# Patient Record
Sex: Female | Born: 1960 | Hispanic: Yes | Marital: Single | Smoking: Never smoker
Health system: Southern US, Community
[De-identification: ages and names within clinical notes are randomized; demographics above are authoritative.]

## PROBLEM LIST (undated history)

## (undated) DIAGNOSIS — H3320 Serous retinal detachment, unspecified eye: Secondary | ICD-10-CM

## (undated) DIAGNOSIS — Z87448 Personal history of other diseases of urinary system: Secondary | ICD-10-CM

## (undated) DIAGNOSIS — E119 Type 2 diabetes mellitus without complications: Secondary | ICD-10-CM

## (undated) DIAGNOSIS — K219 Gastro-esophageal reflux disease without esophagitis: Secondary | ICD-10-CM

## (undated) DIAGNOSIS — R112 Nausea with vomiting, unspecified: Secondary | ICD-10-CM

## (undated) DIAGNOSIS — Z9889 Other specified postprocedural states: Secondary | ICD-10-CM

## (undated) HISTORY — DX: Personal history of other diseases of urinary system: Z87.448

## (undated) HISTORY — DX: Type 2 diabetes mellitus without complications: E11.9

## (undated) HISTORY — DX: Gastro-esophageal reflux disease without esophagitis: K21.9

## (undated) HISTORY — PX: EYE SURGERY: SHX253

## (undated) HISTORY — PX: TUBAL LIGATION: SHX77

---

## 2004-03-17 ENCOUNTER — Ambulatory Visit (HOSPITAL_COMMUNITY): Admission: RE | Admit: 2004-03-17 | Discharge: 2004-03-17 | Payer: Self-pay | Admitting: Family Medicine

## 2007-04-19 ENCOUNTER — Emergency Department (HOSPITAL_COMMUNITY): Admission: EM | Admit: 2007-04-19 | Discharge: 2007-04-19 | Payer: Self-pay | Admitting: Emergency Medicine

## 2008-03-08 ENCOUNTER — Inpatient Hospital Stay (HOSPITAL_COMMUNITY): Admission: EM | Admit: 2008-03-08 | Discharge: 2008-03-11 | Payer: Self-pay | Admitting: Emergency Medicine

## 2010-03-07 ENCOUNTER — Encounter: Payer: Self-pay | Admitting: Family Medicine

## 2010-05-31 LAB — DIFFERENTIAL
Basophils Absolute: 0 10*3/uL (ref 0.0–0.1)
Basophils Relative: 0 % (ref 0–1)
Basophils Relative: 0 % (ref 0–1)
Eosinophils Absolute: 0 10*3/uL (ref 0.0–0.7)
Eosinophils Absolute: 0 10*3/uL (ref 0.0–0.7)
Eosinophils Absolute: 0.1 10*3/uL (ref 0.0–0.7)
Eosinophils Relative: 0 % (ref 0–5)
Eosinophils Relative: 1 % (ref 0–5)
Eosinophils Relative: 1 % (ref 0–5)
Lymphocytes Relative: 17 % (ref 12–46)
Lymphocytes Relative: 23 % (ref 12–46)
Lymphs Abs: 0.4 10*3/uL — ABNORMAL LOW (ref 0.7–4.0)
Lymphs Abs: 0.8 10*3/uL (ref 0.7–4.0)
Lymphs Abs: 1 10*3/uL (ref 0.7–4.0)
Lymphs Abs: 1.2 10*3/uL (ref 0.7–4.0)
Monocytes Absolute: 0.5 10*3/uL (ref 0.1–1.0)
Monocytes Relative: 10 % (ref 3–12)
Monocytes Relative: 14 % — ABNORMAL HIGH (ref 3–12)
Neutro Abs: 3.2 10*3/uL (ref 1.7–7.7)
Neutro Abs: 3.6 10*3/uL (ref 1.7–7.7)
Neutrophils Relative %: 61 % (ref 43–77)
Neutrophils Relative %: 88 % — ABNORMAL HIGH (ref 43–77)

## 2010-05-31 LAB — GLUCOSE, CAPILLARY
Glucose-Capillary: 134 mg/dL — ABNORMAL HIGH (ref 70–99)
Glucose-Capillary: 140 mg/dL — ABNORMAL HIGH (ref 70–99)
Glucose-Capillary: 166 mg/dL — ABNORMAL HIGH (ref 70–99)
Glucose-Capillary: 189 mg/dL — ABNORMAL HIGH (ref 70–99)
Glucose-Capillary: 191 mg/dL — ABNORMAL HIGH (ref 70–99)
Glucose-Capillary: 193 mg/dL — ABNORMAL HIGH (ref 70–99)
Glucose-Capillary: 212 mg/dL — ABNORMAL HIGH (ref 70–99)
Glucose-Capillary: 222 mg/dL — ABNORMAL HIGH (ref 70–99)
Glucose-Capillary: 246 mg/dL — ABNORMAL HIGH (ref 70–99)
Glucose-Capillary: 249 mg/dL — ABNORMAL HIGH (ref 70–99)
Glucose-Capillary: 296 mg/dL — ABNORMAL HIGH (ref 70–99)
Glucose-Capillary: 364 mg/dL — ABNORMAL HIGH (ref 70–99)

## 2010-05-31 LAB — CBC
HCT: 29 % — ABNORMAL LOW (ref 36.0–46.0)
HCT: 30.9 % — ABNORMAL LOW (ref 36.0–46.0)
Hemoglobin: 10.7 g/dL — ABNORMAL LOW (ref 12.0–15.0)
Hemoglobin: 9.7 g/dL — ABNORMAL LOW (ref 12.0–15.0)
Hemoglobin: 9.7 g/dL — ABNORMAL LOW (ref 12.0–15.0)
MCHC: 33.5 g/dL (ref 30.0–36.0)
MCHC: 33.7 g/dL (ref 30.0–36.0)
MCHC: 33.8 g/dL (ref 30.0–36.0)
MCV: 88.9 fL (ref 78.0–100.0)
MCV: 89 fL (ref 78.0–100.0)
Platelets: 230 10*3/uL (ref 150–400)
Platelets: 288 10*3/uL (ref 150–400)
RBC: 3.23 MIL/uL — ABNORMAL LOW (ref 3.87–5.11)
RBC: 3.59 MIL/uL — ABNORMAL LOW (ref 3.87–5.11)
RDW: 13.9 % (ref 11.5–15.5)
WBC: 5 10*3/uL (ref 4.0–10.5)
WBC: 6.2 10*3/uL (ref 4.0–10.5)
WBC: 7.7 10*3/uL (ref 4.0–10.5)

## 2010-05-31 LAB — COMPREHENSIVE METABOLIC PANEL
ALT: 10 U/L (ref 0–35)
ALT: 16 U/L (ref 0–35)
Alkaline Phosphatase: 67 U/L (ref 39–117)
BUN: 9 mg/dL (ref 6–23)
CO2: 21 mEq/L (ref 19–32)
CO2: 25 mEq/L (ref 19–32)
Calcium: 8 mg/dL — ABNORMAL LOW (ref 8.4–10.5)
Calcium: 8.4 mg/dL (ref 8.4–10.5)
Chloride: 100 mEq/L (ref 96–112)
Creatinine, Ser: 0.62 mg/dL (ref 0.4–1.2)
GFR calc non Af Amer: >60 mL/min (ref >60)
GFR calc non Af Amer: >60 mL/min (ref >60)
Glucose, Bld: 225 mg/dL — ABNORMAL HIGH (ref 70–99)
Glucose, Bld: 521 mg/dL (ref 70–99)
Sodium: 130 mEq/L — ABNORMAL LOW (ref 135–145)
Sodium: 141 mEq/L (ref 135–145)
Total Bilirubin: 0.4 mg/dL (ref 0.3–1.2)
Total Protein: 5.3 g/dL — ABNORMAL LOW (ref 6.0–8.3)

## 2010-05-31 LAB — BLOOD GAS, ARTERIAL
O2 Content: 21 L/min
O2 Saturation: 94 %
Patient temperature: 37
pO2, Arterial: 70.5 mmHg — ABNORMAL LOW (ref 80.0–100.0)

## 2010-05-31 LAB — URINALYSIS, ROUTINE W REFLEX MICROSCOPIC
Bilirubin Urine: NEGATIVE
Bilirubin Urine: NEGATIVE
Ketones, ur: NEGATIVE mg/dL
Leukocytes, UA: NEGATIVE
Protein, ur: NEGATIVE mg/dL
Specific Gravity, Urine: <1.005 — ABNORMAL LOW (ref 1.005–1.030)
Specific Gravity, Urine: <1.005 — ABNORMAL LOW (ref 1.005–1.030)
Urobilinogen, UA: 0.2 mg/dL (ref 0.0–1.0)

## 2010-05-31 LAB — BASIC METABOLIC PANEL
BUN: 10 mg/dL (ref 6–23)
BUN: 10 mg/dL (ref 6–23)
CO2: 29 mEq/L (ref 19–32)
Calcium: 7.5 mg/dL — ABNORMAL LOW (ref 8.4–10.5)
Chloride: 106 mEq/L (ref 96–112)
Chloride: 106 mEq/L (ref 96–112)
Creatinine, Ser: 0.63 mg/dL (ref 0.4–1.2)
GFR calc Af Amer: >60 mL/min (ref >60)
GFR calc non Af Amer: >60 mL/min (ref >60)
Potassium: 3.4 mEq/L — ABNORMAL LOW (ref 3.5–5.1)
Potassium: 3.6 mEq/L (ref 3.5–5.1)
Potassium: 4.4 mEq/L (ref 3.5–5.1)
Sodium: 140 mEq/L (ref 135–145)

## 2010-05-31 LAB — URINE MICROSCOPIC-ADD ON

## 2010-05-31 LAB — CULTURE, BLOOD (ROUTINE X 2)
Culture: NO GROWTH
Culture: NO GROWTH

## 2010-05-31 LAB — HEMOGLOBIN A1C
Hgb A1c MFr Bld: 10.6 % — ABNORMAL HIGH (ref 4.6–6.1)
Mean Plasma Glucose: 258 mg/dL

## 2010-05-31 LAB — AMYLASE: Amylase: 56 U/L (ref 27–131)

## 2010-05-31 LAB — HEPATIC FUNCTION PANEL
Indirect Bilirubin: 0.4 mg/dL (ref 0.3–0.9)
Total Protein: 4.7 g/dL — ABNORMAL LOW (ref 6.0–8.3)

## 2010-05-31 LAB — PROTIME-INR: Prothrombin Time: 14.6 seconds (ref 11.6–15.2)

## 2010-05-31 LAB — LIPASE, BLOOD: Lipase: 15 U/L (ref 11–59)

## 2010-05-31 LAB — PHOSPHORUS: Phosphorus: 2.4 mg/dL (ref 2.3–4.6)

## 2010-05-31 LAB — GC/CHLAMYDIA PROBE AMP, GENITAL
Chlamydia, DNA Probe: NEGATIVE
GC Probe Amp, Genital: NEGATIVE

## 2010-05-31 LAB — TSH: TSH: 1.841 u[IU]/mL (ref 0.350–4.500)

## 2010-05-31 LAB — URINE CULTURE: Special Requests: NEGATIVE

## 2010-06-29 NOTE — Discharge Summary (Signed)
NAMETARRY, BLAYNEY            ACCOUNT NO.:  [id number]   MEDICAL RECORD NO.:  [id number]          PATIENT TYPE:  INP   LOCATION:  A340                          FACILITY:  [facility]   PHYSICIAN:  Skeet Latch, DO    DATE OF BIRTH:  1960/10/24   DATE OF ADMISSION:  03/07/2008  DATE OF DISCHARGE:  01/26/2010LH                               DISCHARGE SUMMARY   PRIMARY CARE PHYSICIAN:  [facility] Department.   DISCHARGE DIAGNOSES:  1. Pyelonephristis, improving.  2. Uncontrolled diabetes mellitus.  3. Abdominal pain, improving.  4. Anemia.   BRIEF HOSPITAL COURSE:  This is a 50 year old [name] who  presented to the [facility] Emergency Room after seeing a primary care  physician and was told that she had a urinary tract infection.  She was  started on Cipro secondary to her UTI.  The patient is also complaining  of some lower back pain, saw her primary care physician the day prior,  was started on medications.  She was told if did not work, she needs to  come to the emergency room.  The patient came to the emergency room  stating her pain was 10/10.  She is complaining of bilateral flank pain  and the lower back pain.  She states it is cramping in nature.  The  patient was complaining of dysuria and fever.  The patient's initial  blood sugars in the emergency room was found to be 521.  The patient was  admitted with the above symptoms.  She was started on IV antibiotics.  She was started on insulin.  The patient was placed on IV fluids for her  hyponatremia.  The patient had urine and blood culture sent which so far  have been unremarkable.  A diabetes coordinator  was requested for the  patient's elevated blood sugars.  The patient probably needs to be on  insulin on a daily basis.  The patient does not have a Glucometer at  home.  So, we will hold off on putting the patient on a insulin because  she needs a Glucometer.  The patient's abdominal pain is  greatly  improved.  The patient is not complaining of any flank pain at this time  and does not complain of any nausea or vomiting.  At this time, we feel  the patient can be sent home to follow up with [facility]  Department.   MEDICATIONS ON DISCHARGE:  1. Glipizide, she can return on her previous dose of glipizide twice a      day.  2. Metformin 1000 mg twice a day.  3. Ibuprofen 800 mg p.o. q.6 h p.r.n. pain or fever.  4. Ciprofloxacin 500 mg twice daily and the patient is to continue her      previous dose of ciprofloxacin and to see her course.   VITAL SIGNS:  Temperature is 99.5, pulse 75, respirations 20, and blood  pressure 137/51.  She is sating 98% on 2 L.   LABORATORY DATA:  Her white count is 6.2, hemoglobin 10.4, hematocrit  30.9, and platelet count 288,000.  Sodium 141,  potassium 4.4, chloride  102, CO2 is 29, glucose 171, BUN 10, creatinine 0.63, calcium is 8.2.   RADIOLOGIC STUDIES:  CT of her abdomen and pelvis showed mild  perinephric stranding and surrounding inflammation of uncertain  chronicity.  The pelvis showed no evidence of acute abnormalities within  the pelvis.   CONDITION ON DISCHARGE:  Stable.   DISPOSITION:  The patient will be discharged to home in stable  condition.   DISCHARGE INSTRUCTIONS:  The patient will maintain an 1800 ADA diet, to  increase her activity slowly.  The patient is to follow up with the  [facility] Department in 1-2 days regarding her diabetes  management, get her a Glucometer, and probably start her on some type of  insulin therapy.  The patient is to take all medications as prescribed.  The patient is to return to the emergency room if she has any more flank  pain, severe abdominal pain, nausea, vomiting, or diarrhea and/or call  her primary care physician.      Skeet Latch, DO  Electronically Signed     SM/MEDQ  D:  03/11/2008  T:  03/12/2008  Job:  [id number]

## 2010-06-29 NOTE — H&P (Signed)
NAMEADELIS, DOCTER            ACCOUNT NO.:  [id number]   MEDICAL RECORD NO.:  [id number]          PATIENT TYPE:  INP   LOCATION:  A340                          FACILITY:  [facility]   PHYSICIAN:  Dorris Singh, DO    DATE OF BIRTH:  Oct 14, 1960   DATE OF ADMISSION:  03/07/2008  DATE OF DISCHARGE:  LH                              HISTORY & PHYSICAL   HISTORY:  The patient is a 50 year old Latino woman who presented to the  [facility] Emergency Room after she had seen by her primary care doctor  at [facility] and was told she had an UTI.  She was started  on Cipro.  Prior to that, she was having burning with urination for  about a week.  She also has lower back pain.  She saw her doctor  yesterday.  She was started on medication.  Told if this did not work,  for her to continue to the emergency room.  Today, symptoms worsened.  She rated the pain as 10/10.  Basically bilateral flank pain and  bilateral lower back pain.  Characterized as cramping and is aggravated  by nothing, and relieved by nothing.  The symptoms have been associated  with dysuria and fever.  At one time, it was determined that patient was  suspected for possible pyelonephritis.  Also while the patient was in  the emergency room, it was determined that her blood sugars were  elevated and she was started on a glucose stabilizer.  They came down  prior to being sent out on the floor.  We will continue to monitor those  as well.   PAST MEDICAL HISTORY:  Significant for diabetes.   PAST SURGICAL HISTORY:  None.   SOCIAL HISTORY:  Nonsmoker, nondrinker.  No drug abuse.  Her special  needs is that her English is limited.   ALLERGIES:  NO KNOWN DRUG ALLERGIES.   CURRENT MEDICATIONS:  1. Glipizide, not sure of the dose, twice a day.  2. Metformin 1,000 mg twice a day.  3. Ciprofloxacin 800 mg twice a day.   REVIEW OF SYSTEMS:  CONSTITUTIONAL:  Positive for fever and chills.  EYES/EARS/NOSE/THROAT:   Negative.  CARDIOVASCULAR:  Negative.  PULMONARY:  Negative.  ABDOMEN:  Positive for some nausea.  GU:  Positive for dysuria and CVA tenderness bilaterally.  MUSCULOSKELETAL:  Positive for lower back pain bilaterally.  NEUROLOGIC:  Negative for  loss of consciousness.  ENDOCRINE/HEMATOLOGIC:  Negative.   PHYSICAL EXAMINATION:  VITAL SIGNS:  Blood pressure 134/78, pulse rate  103, respirations 20, temperature 100.3.  GENERAL:  The patient is a 50 year old Latino female who is well-  developed, well-nourished and not in acute distress.  HEENT:  Head is normocephalic, atraumatic.  Ears, nose, mouth and throat  are all normal in appearance.  NECK:  Supple.  No lymphadenopathy.  HEART:  Regular rate and rhythm, S1 and S2 present.  RESPIRATORY:  Clear to auscultation bilaterally.  No wheezes, rales or  rhonchi.  ABDOMEN:  Soft, nontender and nondistended.  PELVIC:  Suprapubic tenderness.  GU:  Positive CVA tenderness bilaterally.  MUSCULOSKELETAL:  Positive lower extremity edema bilaterally.  Positive  pulses.  __________.  NEURO:  Cranial nerves II-XII are grossly intact.  The patient is alert  and oriented x3.  Good motor in all extremities.  SKIN:  __________good texture and color.   LABORATORY DATA:  White count 10.7, hemoglobin 10.7, hematocrit 31.8,  platelet count 42, glucose 364, sodium 130, potassium 3.5, chloride 100,  CO2 21, glucose 521, BUN 13, creatinine 0.1.  Her urine shows moderate  blood, rare bacteria.   ASSESSMENT:  1. Pyelonephritis.  2. Hyponatremia.  3. Hyperglycemia.  4. Diabetes.   PLAN:  Admit the patient to the service of [facility].  As mentioned  above, we will start her on Rocephin IV q.24 h.  She was given Zithromax  and was on Cipro prior too, but we will keep her on the Rocephin to see  how she improves.  We will do GI and DVT prophylaxis.  We will also  start her on the resisting sliding scale protocol.  We will hydrate her  with IV fluids to  correct her hyponatremia.  We will place her on her  home medications.  We will continue to monitor her and make changes as  necessary.      Dorris Singh, DO  Electronically Signed     CB/MEDQ  D:  03/08/2008  T:  03/08/2008  Job:  [id number]

## 2010-11-08 LAB — CBC
HCT: 36.8
Hemoglobin: 13.1
MCHC: 35.7
MCV: 86.6
RDW: 13.1

## 2010-11-08 LAB — COMPREHENSIVE METABOLIC PANEL
Alkaline Phosphatase: 95
BUN: 20
Calcium: 9.4
Creatinine, Ser: 0.63
Glucose, Bld: 409 — ABNORMAL HIGH
Potassium: 4.2
Total Protein: 7.6

## 2010-11-08 LAB — DIFFERENTIAL
Basophils Relative: 1
Lymphocytes Relative: 25
Monocytes Relative: 7
Neutro Abs: 5
Neutrophils Relative %: 66

## 2010-11-08 LAB — URINALYSIS, ROUTINE W REFLEX MICROSCOPIC
Ketones, ur: 40 — AB
Leukocytes, UA: NEGATIVE
Nitrite: NEGATIVE
Protein, ur: NEGATIVE
Urobilinogen, UA: 0.2

## 2010-11-08 LAB — URINE MICROSCOPIC-ADD ON

## 2011-01-19 ENCOUNTER — Emergency Department (HOSPITAL_COMMUNITY): Payer: Self-pay

## 2011-01-19 ENCOUNTER — Other Ambulatory Visit: Payer: Self-pay

## 2011-01-19 ENCOUNTER — Emergency Department (HOSPITAL_COMMUNITY)
Admission: EM | Admit: 2011-01-19 | Discharge: 2011-01-19 | Disposition: A | Discharge status: Home / Self Care | Payer: Self-pay | Attending: Emergency Medicine | Admitting: Emergency Medicine

## 2011-01-19 ENCOUNTER — Encounter: Payer: Self-pay | Admitting: *Deleted

## 2011-01-19 DIAGNOSIS — R10811 Right upper quadrant abdominal tenderness: Secondary | ICD-10-CM | POA: Insufficient documentation

## 2011-01-19 DIAGNOSIS — E119 Type 2 diabetes mellitus without complications: Secondary | ICD-10-CM | POA: Insufficient documentation

## 2011-01-19 DIAGNOSIS — R079 Chest pain, unspecified: Secondary | ICD-10-CM | POA: Insufficient documentation

## 2011-01-19 DIAGNOSIS — Z794 Long term (current) use of insulin: Secondary | ICD-10-CM | POA: Insufficient documentation

## 2011-01-19 DIAGNOSIS — R1013 Epigastric pain: Secondary | ICD-10-CM | POA: Insufficient documentation

## 2011-01-19 LAB — DIFFERENTIAL
Eosinophils Absolute: 0.1 10*3/uL (ref 0.0–0.7)
Lymphs Abs: 2.5 10*3/uL (ref 0.7–4.0)
Monocytes Relative: 7 % (ref 3–12)
Neutro Abs: 3.7 10*3/uL (ref 1.7–7.7)
Neutrophils Relative %: 55 % (ref 43–77)

## 2011-01-19 LAB — CBC
Hemoglobin: 11.9 g/dL — ABNORMAL LOW (ref 12.0–15.0)
MCH: 29.8 pg (ref 26.0–34.0)
RBC: 3.99 MIL/uL (ref 3.87–5.11)

## 2011-01-19 LAB — COMPREHENSIVE METABOLIC PANEL
Alkaline Phosphatase: 93 U/L (ref 39–117)
BUN: 16 mg/dL (ref 6–23)
Chloride: 99 mEq/L (ref 96–112)
GFR calc Af Amer: >90 mL/min (ref >90)
Glucose, Bld: 244 mg/dL — ABNORMAL HIGH (ref 70–99)
Potassium: 3.7 mEq/L (ref 3.5–5.1)
Total Bilirubin: 0.3 mg/dL (ref 0.3–1.2)

## 2011-01-19 LAB — URINALYSIS, ROUTINE W REFLEX MICROSCOPIC
Bilirubin Urine: NEGATIVE
Ketones, ur: NEGATIVE mg/dL
Nitrite: POSITIVE — AB
Urobilinogen, UA: 0.2 mg/dL (ref 0.0–1.0)

## 2011-01-19 LAB — URINE MICROSCOPIC-ADD ON

## 2011-01-19 LAB — LIPASE, BLOOD: Lipase: 32 U/L (ref 11–59)

## 2011-01-19 MED ORDER — RANITIDINE HCL 150 MG PO CAPS: 150.0000 mg | ORAL_CAPSULE | Freq: Two times a day (BID) | ORAL | Status: DC

## 2011-01-19 MED ORDER — HYDROMORPHONE HCL PF 1 MG/ML IJ SOLN
1.0000 mg | Freq: Once | INTRAMUSCULAR | Status: AC
  Administered 2011-01-19: 1 mg via INTRAVENOUS
  Filled 2011-01-19: qty 1

## 2011-01-19 MED ORDER — CIPROFLOXACIN HCL 500 MG PO TABS: 500.0000 mg | ORAL_TABLET | Freq: Two times a day (BID) | ORAL | Status: AC

## 2011-01-19 MED ORDER — ONDANSETRON HCL 4 MG/2ML IJ SOLN
4.0000 mg | Freq: Once | INTRAMUSCULAR | Status: AC
  Administered 2011-01-19: 4 mg via INTRAVENOUS
  Filled 2011-01-19: qty 2

## 2011-01-19 MED ORDER — PANTOPRAZOLE SODIUM 40 MG IV SOLR
40.0000 mg | Freq: Once | INTRAVENOUS | Status: AC
  Administered 2011-01-19: 40 mg via INTRAVENOUS
  Filled 2011-01-19: qty 40

## 2011-01-19 MED ORDER — PROMETHAZINE HCL 25 MG PO TABS: 25.0000 mg | ORAL_TABLET | Freq: Four times a day (QID) | ORAL | Status: AC | PRN

## 2011-01-19 MED ORDER — HYDROCODONE-ACETAMINOPHEN 5-325 MG PO TABS: 1.0000 | ORAL_TABLET | Freq: Four times a day (QID) | ORAL | Status: AC | PRN

## 2011-01-19 MED ORDER — SODIUM CHLORIDE 0.9 % IV SOLN: Freq: Once | INTRAVENOUS | Status: DC

## 2011-01-19 NOTE — ED Notes (Signed)
Remains resting in bed on back. No distress. Equal chest rise and fall. Call bell within reach. Pain 2\10. Family at bedside. Will continue to monitor.

## 2011-01-19 NOTE — ED Notes (Addendum)
Patient more calm, relaxed. Pain 4\10, intermittent sharp pain that goes dull on left chest and radiates to left upper abdomen. No shortness of breath. Equal chest rise and fall, nonlabored. Call bell and family at bedside. MD made aware.

## 2011-01-19 NOTE — ED Notes (Signed)
Pt c/o abdominal pain since yesterday with nausea and diarrhea. Pt states that she started having chest pain in her left chest today and that she gets short of breath when the pain hits. Denies cough or vomiting.

## 2011-01-19 NOTE — ED Notes (Signed)
Into room to see patient. Resting in bed on back. States pain is 3\10. Denies nausea. Denies any needs. Resting comfortably with eyes closed. No distress. Family at bedside. Call bell within reach. Will continue to monitor.

## 2011-01-19 NOTE — ED Notes (Signed)
Into room to see patient. Resting in bed on back. Denies any needs at this time. Pain 2\10. Denies nausea. Family with patient. Call bell within reach. Will continue to monitor.

## 2011-01-19 NOTE — ED Notes (Signed)
Into room to see patient. Resting in bed on back. Appearing nervous, shaking but denies any pain. Instructed to take nice slow, deep breaths to relax. Verbalized understanding and returned demonstration. Family at bedside. Call bell within reach. md aware. No respiratory difficulties.

## 2011-01-19 NOTE — ED Provider Notes (Addendum)
Scribed for Benny Lennert, MD, the patient was seen in room APA04/APA04 . This chart was scribed by Ellie Lunch.   CSN: [id number] Arrival date & time: 01/19/2011  5:25 PM   First MD Initiated Contact with Patient 01/19/11 1730      Chief Complaint  Patient presents with  . Chest Pain    (Consider location/radiation/quality/duration/timing/severity/associated sxs/prior treatment) HPI Pt seen at 5:34 PM  Amanda Zamora is a 50 y.o. female who presents to the Emergency Department complaining of 1 day of epigastric abdominal pain with associated watery diarrhea and nausea. Pain is described as sharp, rated 8/10 in severity and worsened with deep breath. Pt also says she developed CP today.  Pt denies assocaited emesis, fever, and hematochezia. No h/o of abdominal surgeries.   Past Medical History  Diagnosis Date  . Diabetes mellitus     History reviewed. No pertinent past surgical history.  History reviewed. No pertinent family history.  History  Substance Use Topics  . Smoking status: Never Smoker   . Smokeless tobacco: Not on file  . Alcohol Use: No   Review of Systems  Constitutional: Negative for fatigue.  HENT: Negative for congestion, sinus pressure and ear discharge.   Eyes: Negative for discharge.  Respiratory: Negative for cough.   Cardiovascular: Negative for chest pain.  Gastrointestinal: Negative for nausea, abdominal pain, diarrhea and blood in stool.  Genitourinary: Negative for frequency and hematuria.  Musculoskeletal: Negative for back pain.  Skin: Negative for rash.  Neurological: Negative for seizures and headaches.  Hematological: Negative.   Psychiatric/Behavioral: Negative for hallucinations.    Allergies  Review of patient's allergies indicates no known allergies.  Home Medications   Current Outpatient Rx  Name Route Sig Dispense Refill  . IBUPROFEN 600 MG PO TABS Oral Take 600 mg by mouth daily as needed. For pain     . INSULIN  ISOPHANE & REGULAR (70-30) 100 UNIT/ML [place] SUSP Subcutaneous Inject 30 Units into the skin 2 (two) times daily.      Marland Kitchen METFORMIN HCL 1000 MG PO TABS Oral Take 1,000 mg by mouth 2 (two) times daily.      Marland Kitchen CIPROFLOXACIN HCL 500 MG PO TABS Oral Take 1 tablet (500 mg total) by mouth every 12 (twelve) hours. 10 tablet 0  . HYDROCODONE-ACETAMINOPHEN 5-325 MG PO TABS Oral Take 1 tablet by mouth every 6 (six) hours as needed for pain. 10 tablet 0  . PROMETHAZINE HCL 25 MG PO TABS Oral Take 1 tablet (25 mg total) by mouth every 6 (six) hours as needed for nausea. 15 tablet 0  . RANITIDINE HCL 150 MG PO CAPS Oral Take 1 capsule (150 mg total) by mouth 2 (two) times daily. 60 capsule 0    BP 154/66  Pulse 64  Temp(Src) 98.1 F (36.7 C) (Oral)  Resp 16  Ht 5\' 3"  (1.6 m)  Wt 150 lb (68.04 kg)  BMI 26.57 kg/m2  SpO2 100%  LMP 12/29/2010  Physical Exam  Constitutional: She is oriented to person, place, and time. She appears well-developed.  HENT:  Head: Normocephalic and atraumatic.  Eyes: Conjunctivae and EOM are normal. No scleral icterus.  Neck: Neck supple. No thyromegaly present.  Cardiovascular: Normal rate and regular rhythm.  Exam reveals no gallop and no friction rub.   No murmur heard. Pulmonary/Chest: No stridor. She has no wheezes. She has no rales. She exhibits no tenderness.  Abdominal: She exhibits no distension. There is tenderness (moderately tender RUQ). There is no  rebound.  Musculoskeletal: Normal range of motion. She exhibits no edema.  Lymphadenopathy:    She has no cervical adenopathy.  Neurological: She is oriented to person, place, and time. Coordination normal.  Skin: No rash noted. No erythema.  Psychiatric: She has a normal mood and affect. Her behavior is normal.    ED Course  Procedures (including critical care time) DIAGNOSTIC STUDIES: Oxygen Saturation is 100% on room air, normal by my interpretation.    COORDINATION OF CARE:  Labs Reviewed  CBC -  Abnormal; Notable for the following:    Hemoglobin 11.9 (*)    HCT 34.9 (*)    All other components within normal limits  COMPREHENSIVE METABOLIC PANEL - Abnormal; Notable for the following:    Glucose, Bld 244 (*)    All other components within normal limits  URINALYSIS, ROUTINE W REFLEX MICROSCOPIC - Abnormal; Notable for the following:    APPearance HAZY (*)    Glucose, UA >1000 (*)    Hgb urine dipstick TRACE (*)    Nitrite POSITIVE (*)    All other components within normal limits  URINE MICROSCOPIC-ADD ON - Abnormal; Notable for the following:    Squamous Epithelial / LPF FEW (*)    Bacteria, UA MANY (*)    All other components within normal limits  DIFFERENTIAL  LIPASE, BLOOD  POCT I-STAT TROPONIN I  I-STAT TROPONIN I   Dg Chest Portable 1 View  01/19/2011  *RADIOLOGY REPORT*  Clinical Data: Chest pain.  PORTABLE CHEST - 1 VIEW  Comparison: None.  Findings: 1748 hours.  There are low lung volumes. The heart size and mediastinal contours are normal. The lungs are clear. There is no pleural effusion or pneumothorax. No acute osseous findings are identified.  IMPRESSION: No active cardiopulmonary process.  Original Report Authenticated By: Gerrianne Scale, M.D.     1. Abdominal pain      MDM  abd pain,  Pud,  gb dz  The chart was scribed for me under my direct supervision.  I personally performed the history, physical, and medical decision making and all procedures in the evaluation of this patient.Benny Lennert, MD 01/19/11 2158  Benny Lennert, MD 01/19/11 [phone]

## 2011-01-20 ENCOUNTER — Ambulatory Visit (HOSPITAL_COMMUNITY)
Admit: 2011-01-20 | Discharge: 2011-01-20 | Disposition: A | Discharge status: Home / Self Care | Payer: Self-pay | Source: Ambulatory Visit | Attending: Emergency Medicine | Admitting: Emergency Medicine

## 2011-01-20 DIAGNOSIS — E119 Type 2 diabetes mellitus without complications: Secondary | ICD-10-CM | POA: Insufficient documentation

## 2011-01-20 DIAGNOSIS — R109 Unspecified abdominal pain: Secondary | ICD-10-CM | POA: Insufficient documentation

## 2013-03-28 ENCOUNTER — Inpatient Hospital Stay (HOSPITAL_COMMUNITY)
Admission: EM | Admit: 2013-03-28 | Discharge: 2013-03-29 | DRG: 313 | Disposition: A | Discharge status: Home / Self Care | Payer: Self-pay | Attending: Family Medicine | Admitting: Family Medicine

## 2013-03-28 ENCOUNTER — Emergency Department (HOSPITAL_COMMUNITY): Payer: Self-pay

## 2013-03-28 ENCOUNTER — Other Ambulatory Visit: Payer: Self-pay

## 2013-03-28 ENCOUNTER — Encounter (HOSPITAL_COMMUNITY): Payer: Self-pay | Admitting: Emergency Medicine

## 2013-03-28 DIAGNOSIS — E119 Type 2 diabetes mellitus without complications: Secondary | ICD-10-CM

## 2013-03-28 DIAGNOSIS — E7800 Pure hypercholesterolemia, unspecified: Secondary | ICD-10-CM | POA: Diagnosis present

## 2013-03-28 DIAGNOSIS — F411 Generalized anxiety disorder: Secondary | ICD-10-CM | POA: Diagnosis present

## 2013-03-28 DIAGNOSIS — R072 Precordial pain: Secondary | ICD-10-CM | POA: Diagnosis present

## 2013-03-28 DIAGNOSIS — R0789 Other chest pain: Principal | ICD-10-CM | POA: Diagnosis present

## 2013-03-28 DIAGNOSIS — Z794 Long term (current) use of insulin: Secondary | ICD-10-CM

## 2013-03-28 DIAGNOSIS — R079 Chest pain, unspecified: Secondary | ICD-10-CM

## 2013-03-28 LAB — TROPONIN I: Troponin I: <0.3 ng/mL (ref <0.30)

## 2013-03-28 LAB — CBC
HCT: 36 % (ref 36.0–46.0)
Hemoglobin: 12.4 g/dL (ref 12.0–15.0)
MCH: 30.3 pg (ref 26.0–34.0)
MCHC: 34.4 g/dL (ref 30.0–36.0)
MCV: 88 fL (ref 78.0–100.0)
Platelets: 310 10*3/uL (ref 150–400)
RBC: 4.09 MIL/uL (ref 3.87–5.11)
RDW: 13.7 % (ref 11.5–15.5)
WBC: 7.6 10*3/uL (ref 4.0–10.5)

## 2013-03-28 LAB — HEPATIC FUNCTION PANEL
ALT: 29 U/L (ref 0–35)
AST: 18 U/L (ref 0–37)
Albumin: 3.7 g/dL (ref 3.5–5.2)
Alkaline Phosphatase: 108 U/L (ref 39–117)
TOTAL PROTEIN: 8.2 g/dL (ref 6.0–8.3)
Total Bilirubin: 0.3 mg/dL (ref 0.3–1.2)

## 2013-03-28 LAB — BASIC METABOLIC PANEL
BUN: 21 mg/dL (ref 6–23)
CALCIUM: 10 mg/dL (ref 8.4–10.5)
CHLORIDE: 99 meq/L (ref 96–112)
CO2: 28 mEq/L (ref 19–32)
CREATININE: 0.89 mg/dL (ref 0.50–1.10)
GFR calc non Af Amer: 74 mL/min — ABNORMAL LOW (ref >90)
GFR, EST AFRICAN AMERICAN: 86 mL/min — AB (ref >90)
Glucose, Bld: 149 mg/dL — ABNORMAL HIGH (ref 70–99)
Potassium: 3.8 mEq/L (ref 3.7–5.3)
SODIUM: 141 meq/L (ref 137–147)

## 2013-03-28 LAB — GLUCOSE, CAPILLARY: Glucose-Capillary: 166 mg/dL — ABNORMAL HIGH (ref 70–99)

## 2013-03-28 MED ORDER — ALPRAZOLAM 0.25 MG PO TABS: 0.2500 mg | ORAL_TABLET | Freq: Three times a day (TID) | ORAL | Status: DC | PRN

## 2013-03-28 MED ORDER — SODIUM CHLORIDE 0.9 % IJ SOLN: 3.0000 mL | INTRAMUSCULAR | Status: DC | PRN

## 2013-03-28 MED ORDER — MORPHINE SULFATE 2 MG/ML IJ SOLN
2.0000 mg | INTRAMUSCULAR | Status: DC | PRN
  Administered 2013-03-28 – 2013-03-29 (×2): 2 mg via INTRAVENOUS
  Filled 2013-03-28 (×2): qty 1

## 2013-03-28 MED ORDER — SODIUM CHLORIDE 0.9 % IV SOLN: 250.0000 mL | INTRAVENOUS | Status: DC | PRN

## 2013-03-28 MED ORDER — PNEUMOCOCCAL VAC POLYVALENT 25 MCG/0.5ML IJ INJ
0.5000 mL | INJECTION | INTRAMUSCULAR | Status: DC
  Filled 2013-03-28: qty 0.5

## 2013-03-28 MED ORDER — ENOXAPARIN SODIUM 40 MG/0.4ML ~~LOC~~ SOLN
40.0000 mg | SUBCUTANEOUS | Status: DC
  Administered 2013-03-28: 40 mg via SUBCUTANEOUS
  Filled 2013-03-28: qty 0.4

## 2013-03-28 MED ORDER — ASPIRIN 325 MG PO TABS
325.0000 mg | ORAL_TABLET | Freq: Once | ORAL | Status: AC
  Administered 2013-03-28: 325 mg via ORAL
  Filled 2013-03-28: qty 1

## 2013-03-28 MED ORDER — ALPRAZOLAM 0.5 MG PO TABS
0.2500 mg | ORAL_TABLET | Freq: Once | ORAL | Status: AC
  Administered 2013-03-28: 0.25 mg via ORAL
  Filled 2013-03-28: qty 1

## 2013-03-28 MED ORDER — SODIUM CHLORIDE 0.9 % IJ SOLN
3.0000 mL | Freq: Two times a day (BID) | INTRAMUSCULAR | Status: DC
  Administered 2013-03-28 – 2013-03-29 (×2): 3 mL via INTRAVENOUS

## 2013-03-28 MED ORDER — INSULIN ASPART 100 UNIT/ML ~~LOC~~ SOLN
0.0000 [IU] | Freq: Three times a day (TID) | SUBCUTANEOUS | Status: DC
  Administered 2013-03-29: 1 [IU] via SUBCUTANEOUS

## 2013-03-28 MED ORDER — INSULIN ASPART PROT & ASPART (70-30 MIX) 100 UNIT/ML ~~LOC~~ SUSP
30.0000 [IU] | Freq: Two times a day (BID) | SUBCUTANEOUS | Status: DC
  Administered 2013-03-29: 30 [IU] via SUBCUTANEOUS
  Filled 2013-03-28: qty 10

## 2013-03-28 NOTE — ED Provider Notes (Signed)
CSN: [id number]     Arrival date & time 03/28/13  1736 History   First MD Initiated Contact with Patient 03/28/13 1817     Chief Complaint  Patient presents with  . Chest Pain     (Consider location/radiation/quality/duration/timing/severity/associated sxs/prior Treatment) HPI... intermittent chest tightness with radiation to her back and left arm for several days with associated dyspnea, nausea, diaphoresis, dizziness. No cigarette smoking. Risk factors include diabetes mellitus and questionable anemia. She is also has a history of hypercalcemia. Nothing makes symptoms better or worse. Severity is moderate  Past Medical History  Diagnosis Date  . Diabetes mellitus    History reviewed. No pertinent past surgical history. No family history on file. History  Substance Use Topics  . Smoking status: Never Smoker   . Smokeless tobacco: Not on file  . Alcohol Use: No   OB History   Grav Para Term Preterm Abortions TAB SAB Ect Mult Living                 Review of Systems  All other systems reviewed and are negative.      Allergies  Review of patient's allergies indicates no known allergies.  Home Medications   Current Outpatient Rx  Name  Route  Sig  Dispense  Refill  . ibuprofen (ADVIL,MOTRIN) 600 MG tablet   Oral   Take 600 mg by mouth daily as needed. For pain          . insulin NPH-insulin regular (NOVOLIN 70/30) (70-30) 100 UNIT/ML injection   Subcutaneous   Inject 30 Units into the skin 2 (two) times daily.           . metFORMIN (GLUCOPHAGE) 1000 MG tablet   Oral   Take 1,000 mg by mouth 2 (two) times daily.           . Vitamin D, Ergocalciferol, (DRISDOL) 50000 UNITS CAPS capsule   Oral   Take 50,000 Units by mouth every 7 (seven) days. On saturday          BP 142/74  Pulse 75  Temp(Src) 97.7 F (36.5 C) (Oral)  Resp 18  SpO2 100% Physical Exam  Nursing note and vitals reviewed. Constitutional: She is oriented to person, place, and time.  She appears well-developed and well-nourished.  HENT:  Head: Normocephalic and atraumatic.  Eyes: Conjunctivae and EOM are normal. Pupils are equal, round, and reactive to light.  Neck: Normal range of motion. Neck supple.  Cardiovascular: Normal rate, regular rhythm and normal heart sounds.   Pulmonary/Chest: Effort normal and breath sounds normal.  Abdominal: Soft. Bowel sounds are normal.  Musculoskeletal: Normal range of motion.  Neurological: She is alert and oriented to person, place, and time.  Skin: Skin is warm and dry.  Psychiatric: She has a normal mood and affect. Her behavior is normal.    ED Course  Procedures (including critical care time) Labs Review Labs Reviewed  BASIC METABOLIC PANEL - Abnormal; Notable for the following:    Glucose, Bld 149 (*)    GFR calc non Af Amer 74 (*)    GFR calc Af Amer 86 (*)    All other components within normal limits  TROPONIN I  HEPATIC FUNCTION PANEL   Imaging Review Dg Chest 2 View  03/28/2013   CLINICAL DATA:  Chest pain  EXAM: CHEST  2 VIEW  COMPARISON:  January 19, 2011  FINDINGS: The heart size and mediastinal contours are within normal limits. There is no focal infiltrate, pulmonary  edema, or pleural effusion. The visualized skeletal structures are unremarkable.  IMPRESSION: No active cardiopulmonary disease.   Electronically Signed   By: Abelardo Diesel M.D.   On: 03/28/2013 18:07    EKG Interpretation   None       MDM   Final diagnoses:  Chest pain    Patient has good history for cardiac related chest pain with associated cardiac risk factors. EKG, hemoglobin, troponin all negative. Admit to general medicine.    Nat Christen, MD 03/28/13 2000

## 2013-03-28 NOTE — ED Notes (Signed)
Pt c/o chest pain and tightness that gets worse when she moves her arms.  Also c/o feeling tired and dizzy.  Reports SOB, denies n/v.

## 2013-03-28 NOTE — ED Notes (Signed)
Patient ambulated to bathroom; c/o dizziness when standing.  Patient states she doesn't feel any better; continues to c/o chest pain.

## 2013-03-28 NOTE — H&P (Signed)
PCP:   No primary provider on file.   Chief Complaint:  Chest pain  HPI:  53 year old [name]  has a past medical history of Diabetes mellitus. today presented to the ED with chief complaint of chest pain which started only to go. The pain is located on the left side and his radiation towards the left him. It has been associated with shortness of breath, diaphoresis, dizziness. Patient has history of diabetes mellitus and mild hypercholesterolemia. She does not have history of CAD she does not smoke cigarettes. No family history of heart disease. Patient also has been experiencing shortness of breath and palpitations. Admits to having some anxiety. She denies recent injury to her chest, denies lifting heavy weight. Pain is aggravated on movement and walking. Patient also feels dizzy on walking.  Allergies:  No Known Allergies    Past Medical History  Diagnosis Date  . Diabetes mellitus     History reviewed. No pertinent past surgical history.  Prior to Admission medications   Medication Sig Start Date End Date Taking? Authorizing Provider  ibuprofen (ADVIL,MOTRIN) 600 MG tablet Take 600 mg by mouth daily as needed. For pain    Yes Historical Provider, MD  insulin NPH-insulin regular (NOVOLIN 70/30) (70-30) 100 UNIT/ML injection Inject 30 Units into the skin 2 (two) times daily.     Yes Historical Provider, MD  metFORMIN (GLUCOPHAGE) 1000 MG tablet Take 1,000 mg by mouth 2 (two) times daily.     Yes Historical Provider, MD  Vitamin D, Ergocalciferol, (DRISDOL) 50000 UNITS CAPS capsule Take 50,000 Units by mouth every 7 (seven) days. On saturday   Yes Historical Provider, MD    Social History:  reports that she has never smoked. She does not have any smokeless tobacco history on file. She reports that she does not drink alcohol or use illicit drugs.  No family history on file.   All the positives are listed in BOLD  Review of Systems:  HEENT: Headache, blurred vision,  runny nose, sore throat Neck: Hypothyroidism, hyperthyroidism,,lymphadenopathy Chest : Shortness of breath, history of COPD, Asthma Heart : Chest pain, history of coronary arterey disease GI:  Nausea, vomiting, diarrhea, constipation, GERD GU: Dysuria, urgency, frequency of urination, hematuria Neuro: Stroke, seizures, syncope Psych: Depression, anxiety, hallucinations   Physical Exam: Blood pressure 142/74, pulse 75, temperature 97.7 F (36.5 C), temperature source Oral, resp. rate 18, SpO2 100.00%. Constitutional:   Patient is a well-developed anxious looking female in mild distress and cooperative with exam. Head: Normocephalic and atraumatic Mouth: Mucus membranes moist Eyes: PERRL, EOMI, conjunctivae normal Neck: Supple, No Thyromegaly Cardiovascular: RRR, S1 normal, S2 normal Pulmonary/Chest: CTAB, no wheezes, rales, or rhonchi Abdominal: Soft. Non-tender, non-distended, bowel sounds are normal, no masses, organomegaly, or guarding present.  Neurological: A&O x3, Strenght is normal and symmetric bilaterally, cranial nerve II-XII are grossly intact, no focal motor deficit, sensory intact to light touch bilaterally.  Extremities : No Cyanosis, Clubbing or Edema   Labs on Admission:  Results for orders placed during the hospital encounter of 03/28/13 (from the past 48 hour(s))  BASIC METABOLIC PANEL     Status: Abnormal   Collection Time    03/28/13  6:24 PM      Result Value Ref Range   Sodium 141  137 - 147 mEq/L   Potassium 3.8  3.7 - 5.3 mEq/L   Chloride 99  96 - 112 mEq/L   CO2 28  19 - 32 mEq/L   Glucose, Bld 149 (*) 70 -  99 mg/dL   BUN 21  6 - 23 mg/dL   Creatinine, Ser 0.89  0.50 - 1.10 mg/dL   Calcium 10.0  8.4 - 10.5 mg/dL   GFR calc non Af Amer 74 (*) >90 mL/min   GFR calc Af Amer 86 (*) >90 mL/min   Comment: (NOTE)     The eGFR has been calculated using the CKD EPI equation.     This calculation has not been validated in all clinical situations.     eGFR's  persistently <90 mL/min signify possible Chronic Kidney     Disease.  TROPONIN I     Status: None   Collection Time    03/28/13  6:24 PM      Result Value Ref Range   Troponin I <0.30  <0.30 ng/mL   Comment:            Due to the release kinetics of cTnI,     a negative result within the first hours     of the onset of symptoms does not rule out     myocardial infarction with certainty.     If myocardial infarction is still suspected,     repeat the test at appropriate intervals.  HEPATIC FUNCTION PANEL     Status: None   Collection Time    03/28/13  6:24 PM      Result Value Ref Range   Total Protein 8.2  6.0 - 8.3 g/dL   Albumin 3.7  3.5 - 5.2 g/dL   AST 18  0 - 37 U/L   ALT 29  0 - 35 U/L   Alkaline Phosphatase 108  39 - 117 U/L   Total Bilirubin 0.3  0.3 - 1.2 mg/dL   Bilirubin, Direct <0.2  0.0 - 0.3 mg/dL   Indirect Bilirubin NOT CALCULATED  0.3 - 0.9 mg/dL    Radiological Exams on Admission: Dg Chest 2 View  03/28/2013   CLINICAL DATA:  Chest pain  EXAM: CHEST  2 VIEW  COMPARISON:  January 19, 2011  FINDINGS: The heart size and mediastinal contours are within normal limits. There is no focal infiltrate, pulmonary edema, or pleural effusion. The visualized skeletal structures are unremarkable.  IMPRESSION: No active cardiopulmonary disease.   Electronically Signed   By: Abelardo Diesel M.D.   On: 03/28/2013 18:07    Assessment/Plan Principal Problem:   Chest pain Active Problems:   DM (diabetes mellitus)  anxiety  Chest pain Patient has risk factors for CAD, will admit for rule out. Cardiac enzymes the ED her negative. EKG shows no significant abnormality. We'll obtain troponin every 6 hours x3. Patient might need cardiac stress test as outpatient. Will give morphine when necessary for the pain.  Anxiety Patient has been anxious in the ED and is complaining of palpitations, will start Xanax 0.25 every 8 hours when necessary for anxiety. Will give 1 dose of Xanax 0.25x1  now.  Diabetes mellitus Patient takes metformin at home, will hold the metformin at this time and start sliding scale insulin. Will check blood glucose Q. a.c. and at bedtime.  DVT prophylaxis Lovenox   Code status:  Family discussion:   Time Spent on Admission: 67 minutes  Dontray Haberland S [facility] Pager: [phone] 03/28/2013, 8:08 PM  If 7PM-7AM, please contact night-coverage  [email or web address]  Password [email or web address]

## 2013-03-29 LAB — TROPONIN I
Troponin I: <0.3 ng/mL (ref <0.30)
Troponin I: <0.3 ng/mL (ref <0.30)

## 2013-03-29 LAB — GLUCOSE, CAPILLARY: GLUCOSE-CAPILLARY: 128 mg/dL — AB (ref 70–99)

## 2013-03-29 MED ORDER — ALPRAZOLAM 0.25 MG PO TABS: 0.2500 mg | ORAL_TABLET | Freq: Two times a day (BID) | ORAL | Status: DC | PRN

## 2013-03-29 NOTE — Progress Notes (Signed)
Pt is to be discharged home today. Pt is in NAD, IV is out, all paperwork has been reviewed/discussed with patient, and there are no questions/concerns at this time. Assessment is unchanged from this morning. Pt is to be accompanied downstairs by staff and family via wheelchair.  

## 2013-03-29 NOTE — Discharge Summary (Signed)
I have directly reviewed the clinical findings, lab, imaging studies and management of this patient in detail. I have interviewed and examined the patient and agree with the documentation,  as recorded by Ms. Dyanne Carrel, NP.  Patient was seen examined. Her chest pain does appear to be more musculoskeletal in nature as it is reproducible with palpation of the left chest wall. Patient will have outpatient followup with cardiology. Her troponins were reviewed and were found to be negative x3 sets, her EKG did not show any ST or T-wave abnormalities. Patient will be discharged to home.  Cristal Ford M.D on 03/29/2013 at 10:43 AM  [facility] Office  [phone]

## 2013-03-29 NOTE — Discharge Summary (Signed)
Physician Discharge Summary  Amanda Zamora XTK:[id number] DOB: 1960-03-26 DOA: 03/28/2013  PCP: No primary provider on file.  Admit date: 03/28/2013 Discharge date: 03/29/2013  Time spent: 40 minutes  Recommendations for Outpatient Follow-up:  1. Has appointment with Dr. Domenic Polite 04/17/13 to establish cardiology patient.   Discharge Diagnoses:  Principal Problem:   Chest pain Active Problems:   DM (diabetes mellitus)   Discharge Condition: stable  Diet recommendation: carb modified heart healthy  Filed Weights   03/28/13 2221  Weight: 77.6 kg (171 lb 1.2 oz)    History of present illness:  53 year old [name] has a past medical history of Diabetes mellitus. Presented to the ED on 03/28/13 with chief complaint of chest pain. The pain  located on the left side and has radiation towards the left arm. It had been associated with shortness of breath, diaphoresis, dizziness.Patient has history of diabetes mellitus and mild hypercholesterolemia. She does not have history of CAD she does not smoke cigarettes. No family history of heart disease. Patient also had been experiencing shortness of breath and palpitations. Admited to having some anxiety. She denied recent injury to her chest, denied lifting heavy weight.  Pain is aggravated on movement and walking.       Hospital Course:  Chest pain  Active Problems:  DM (diabetes mellitus)  anxiety   Chest pain  Patient has risk factors for CAD. Likely musculoskeletal.  Cardiac enzymes negative x3.  EKG shows no significant abnormality.  Left chest shoulder tender to palpation. Increased pain with sitting up and left arm movement. Patient admitted to pulling on heavy box recently. She does have risk factor. Has appointment with cardiology for OP work up. Will discharge with NSAID for musculoskeletal pain.    Anxiety:  Patient had been anxious in the ED and was complaining of palpitations. Xanax 0.25 prn started. Will provide few  days prescription at discharge.    Diabetes mellitus  Patient takes metformin at home will resume at discharge. CBG's controlled during this hospitalization.    Procedures:  none  Consultations:  none  Discharge Exam: Filed Vitals:   03/29/13 0642  BP: 118/75  Pulse: 69  Temp: 97.7 F (36.5 C)  Resp: 14    General: well nourished NAD Cardiovascular: RRR No m/g/r no LE edema.  Respiratory: normal effort BS clear bilaterally no wheeze  Discharge Instructions  Discharge Orders   Future Appointments Provider Department Dept Phone   04/17/2013 8:40 AM Satira Sark, MD [place] [phone]   Future Orders Complete By Expires   Diet - low sodium heart healthy  As directed    Discharge instructions  As directed    Comments:     Take medication as directed See cardiology as scheduled   Increase activity slowly  As directed        Medication List         ALPRAZolam 0.25 MG tablet  Commonly known as:  XANAX  Take 1 tablet (0.25 mg total) by mouth 2 (two) times daily as needed for anxiety.     ibuprofen 600 MG tablet  Commonly known as:  ADVIL,MOTRIN  Take 600 mg by mouth daily as needed. For pain     insulin NPH-regular Human (70-30) 100 UNIT/ML injection  Commonly known as:  NOVOLIN 70/30  Inject 30 Units into the skin 2 (two) times daily.     metFORMIN 1000 MG tablet  Commonly known as:  GLUCOPHAGE  Take 1,000 mg by mouth 2 (two) times  daily.     Vitamin D (Ergocalciferol) 50000 UNITS Caps capsule  Commonly known as:  DRISDOL  Take 50,000 Units by mouth every 7 (seven) days. On saturday       No Known Allergies     Follow-up Information   Follow up with Rozann Lesches, MD On 04/17/2013. (8:40 am to be established with cardiologist.)    Specialty:  Cardiology   Contact information:   [place] [address] [phone]        The results of significant diagnostics from this hospitalization (including imaging,  microbiology, ancillary and laboratory) are listed below for reference.    Significant Diagnostic Studies: Dg Chest 2 View  03/28/2013   CLINICAL DATA:  Chest pain  EXAM: CHEST  2 VIEW  COMPARISON:  January 19, 2011  FINDINGS: The heart size and mediastinal contours are within normal limits. There is no focal infiltrate, pulmonary edema, or pleural effusion. The visualized skeletal structures are unremarkable.  IMPRESSION: No active cardiopulmonary disease.   Electronically Signed   By: Abelardo Diesel M.D.   On: 03/28/2013 18:07    Microbiology: No results found for this or any previous visit (from the past 240 hour(s)).   Labs: Basic Metabolic Panel:  Recent Labs Lab 03/28/13 1824  NA 141  K 3.8  CL 99  CO2 28  GLUCOSE 149*  BUN 21  CREATININE 0.89  CALCIUM 10.0   Liver Function Tests:  Recent Labs Lab 03/28/13 1824  AST 18  ALT 29  ALKPHOS 108  BILITOT 0.3  PROT 8.2  ALBUMIN 3.7   No results found for this basename: LIPASE, AMYLASE,  in the last 168 hours No results found for this basename: AMMONIA,  in the last 168 hours CBC:  Recent Labs Lab 03/28/13 1824  WBC 7.6  HGB 12.4  HCT 36.0  MCV 88.0  PLT 310   Cardiac Enzymes:  Recent Labs Lab 03/28/13 1824 03/28/13 2134 03/29/13 0250  TROPONINI <0.30 <0.30 <0.30   BNP: BNP (last 3 results) No results found for this basename: PROBNP,  in the last 8760 hours CBG:  Recent Labs Lab 03/28/13 2318 03/29/13 0735  GLUCAP 166* 128*       Signed:  Dyanne Carrel M  [facility] 03/29/2013, 9:37 AM

## 2013-04-17 ENCOUNTER — Encounter: Payer: Self-pay | Admitting: *Deleted

## 2013-04-17 ENCOUNTER — Ambulatory Visit (INDEPENDENT_AMBULATORY_CARE_PROVIDER_SITE_OTHER): Payer: Self-pay | Admitting: Cardiology

## 2013-04-17 ENCOUNTER — Encounter: Payer: Self-pay | Admitting: Cardiology

## 2013-04-17 VITALS — BP 141/84 | HR 77 | Ht 63.0 in | Wt 171.0 lb

## 2013-04-17 DIAGNOSIS — E119 Type 2 diabetes mellitus without complications: Secondary | ICD-10-CM

## 2013-04-17 DIAGNOSIS — R072 Precordial pain: Secondary | ICD-10-CM

## 2013-04-17 NOTE — Assessment & Plan Note (Signed)
Keep follow up with primary care provider.

## 2013-04-17 NOTE — Assessment & Plan Note (Signed)
Symptoms are atypical based on description, outlined above. ECG is nonspecific, recent cardiac markers were negative during hospital observation. She does have history of type 2 diabetes mellitus which increases her risk factor profile somewhat. We discussed proceeding with a GXT for basic cardiac risk stratification. If low risk, probably does not require further cardiac workup at this time. We will inform her of the results.

## 2013-04-17 NOTE — Progress Notes (Signed)
Clinical Summary Amanda Zamora is a 53 y.o.female referred for cardiology consultation by Dyanne Carrel NP following a recent hospitalization. She was admitted with chest pain that was felt to be potentially musculoskeletal, ruled out for myocardial infarction by cardiac enzymes. ECG showed normal sinus rhythm with rightward axis but no acute ST segment changes. Chest x-ray reported no acute abnormalities. Cardiology was not consulted, and no additional cardiac studies were obtained during her hospital stay.  She is here with her daughter today. Primary care is with Ms. Schmucker NP at [facility]. Patient states that she has had left-sided chest discomfort, also in her left shoulder and upper back. She states it is worse when she uses both of her arms at work to lift and pick up things. Had a more intense episode the other evening at rest, for which her daughter put on BenGay and she noted some improvement. Not entirely clear that this is exertional otherwise.  Cardiac risk factors include diabetes mellitus, she has no family history of premature CAD, lipid status is uncertain.   No Known Allergies  Current Outpatient Prescriptions  Medication Sig Dispense Refill  . ALPRAZolam (XANAX) 0.25 MG tablet Take 1 tablet (0.25 mg total) by mouth 2 (two) times daily as needed for anxiety.  15 tablet  0  . ibuprofen (ADVIL,MOTRIN) 600 MG tablet Take 600 mg by mouth daily as needed. For pain       . insulin NPH-insulin regular (NOVOLIN 70/30) (70-30) 100 UNIT/ML injection Inject 30 Units into the skin 2 (two) times daily.        . metFORMIN (GLUCOPHAGE) 1000 MG tablet Take 1,000 mg by mouth 2 (two) times daily.        . Vitamin D, Ergocalciferol, (DRISDOL) 50000 UNITS CAPS capsule Take 50,000 Units by mouth every 7 (seven) days. On saturday       No current facility-administered medications for this visit.    Past Medical History  Diagnosis Date  . Type 2 diabetes mellitus    . History of pyelonephritis     History reviewed. No pertinent past surgical history.  Family History  Problem Relation Age of Onset  . Diabetes Mellitus II Mother     Social History Ms. Rybicki reports that she has never smoked. She does not have any smokeless tobacco history on file. Ms. Guggisberg reports that she does not drink alcohol.  Review of Systems Head no palpitations, dizziness, syncope. No claudication. No bleeding problems. Stable appetite. Otherwise negative.  Physical Examination Filed Vitals:   04/17/13 0925  BP: 141/84  Pulse: 77   Filed Weights   04/17/13 0925  Weight: 171 lb (77.565 kg)   Overweight woman, appears palpable at rest. HEENT: Conjunctiva and lids normal, oropharynx clear. Neck: Supple, no elevated JVP or carotid bruits, no thyromegaly. Lungs: Clear to auscultation, nonlabored breathing at rest. Cardiac: Regular rate and rhythm, no S3 or significant systolic murmur, no pericardial rub. Abdomen: Soft, nontender, bowel sounds present, no guarding or rebound. Extremities: No pitting edema, distal pulses 2+. Skin: Warm and dry. Musculoskeletal: No kyphosis. Neuropsychiatric: Alert and oriented x3, affect grossly appropriate.   Problem List and Plan   Precordial pain Symptoms are atypical based on description, outlined above. ECG is nonspecific, recent cardiac markers were negative during hospital observation. She does have history of type 2 diabetes mellitus which increases her risk factor profile somewhat. We discussed proceeding with a GXT for basic cardiac risk stratification. If low risk, probably does  not require further cardiac workup at this time. We will inform her of the results.  Type 2 diabetes mellitus Keep follow up with primary care provider.    Satira Sark, M.D., F.A.C.C.

## 2013-04-17 NOTE — Patient Instructions (Addendum)
Your physician has requested that you have an exercise tolerance test. For further information please visit [email or web address]. Please also follow instruction sheet, as given. Office will contact with results via phone or letter.   Note for work provided today.   Follow up will be based on test results

## 2013-04-24 ENCOUNTER — Ambulatory Visit (HOSPITAL_COMMUNITY)
Admission: RE | Admit: 2013-04-24 | Discharge: 2013-04-24 | Disposition: A | Discharge status: Home / Self Care | Payer: Self-pay | Source: Ambulatory Visit | Attending: Cardiology | Admitting: Cardiology

## 2013-04-24 ENCOUNTER — Encounter: Payer: Self-pay | Admitting: *Deleted

## 2013-04-24 DIAGNOSIS — R072 Precordial pain: Secondary | ICD-10-CM

## 2013-04-24 NOTE — Progress Notes (Addendum)
Stress Lab Nurses Notes - Teresha Hanks 04/24/2013 Reason for doing test: Precordial pain Type of test: Regular GTX Nurse performing test: Gerrit Halls, RN Nuclear Medicine Tech: Not Applicable Echo Tech: Not Applicable MD performing test: Naven Giambalvo/K.Lenze PA Family MD: Rosetta Posner NP @ [facility] Test explained and consent signed: yes IV started: No IV started Symptoms: fatigue Treatment/Intervention: None Reason test stopped: fatigue and reached target HR After recovery IV was: NA Patient to return to [place]. Med at : NA Patient discharged: Home Patient's Condition upon discharge was: stable Comments: During test peak BP 173/77 & HR 164.  Recovery BP 158/73 & HR 86.  Symptoms resolved in recovery. Geanie Cooley T  The patient exercised according to the Bruce protocol for 7 minutes achieving 10.10 METs. Heart rate increased from 69 bpm to 164 bpm (97% THR). The test was stopped due to fatigue, the patient did not experience any chest pain.  Baseline EKG showed NSR. There were no ischemic EKG changes and no significant arrhythmias.  Conclusion 1. Negative exercise stress test for ischemia 2. Duke treadmill score 7, consistent with low risk for major cardiac events 3. Very good functional capacity (120% of age/gender predicted)   Carlyle Dolly MD

## 2013-05-16 ENCOUNTER — Encounter (HOSPITAL_COMMUNITY): Payer: Self-pay | Admitting: Emergency Medicine

## 2013-05-16 ENCOUNTER — Emergency Department (HOSPITAL_COMMUNITY)
Admission: EM | Admit: 2013-05-16 | Discharge: 2013-05-17 | Disposition: A | Discharge status: Home / Self Care | Payer: Self-pay | Attending: Emergency Medicine | Admitting: Emergency Medicine

## 2013-05-16 DIAGNOSIS — Z794 Long term (current) use of insulin: Secondary | ICD-10-CM | POA: Insufficient documentation

## 2013-05-16 DIAGNOSIS — R109 Unspecified abdominal pain: Secondary | ICD-10-CM

## 2013-05-16 DIAGNOSIS — R110 Nausea: Secondary | ICD-10-CM | POA: Insufficient documentation

## 2013-05-16 DIAGNOSIS — R1011 Right upper quadrant pain: Secondary | ICD-10-CM | POA: Insufficient documentation

## 2013-05-16 DIAGNOSIS — E119 Type 2 diabetes mellitus without complications: Secondary | ICD-10-CM | POA: Insufficient documentation

## 2013-05-16 DIAGNOSIS — Z79899 Other long term (current) drug therapy: Secondary | ICD-10-CM | POA: Insufficient documentation

## 2013-05-16 DIAGNOSIS — Z87448 Personal history of other diseases of urinary system: Secondary | ICD-10-CM | POA: Insufficient documentation

## 2013-05-16 NOTE — ED Notes (Signed)
Pt c/o severe abd pain for one week, nausea, no vomiting or diarrhea.  Pt states every time she tries to eat she feels worse and gets bloated.

## 2013-05-17 ENCOUNTER — Emergency Department (HOSPITAL_COMMUNITY): Payer: Self-pay

## 2013-05-17 LAB — CBC WITH DIFFERENTIAL/PLATELET
BASOS ABS: 0 10*3/uL (ref 0.0–0.1)
BASOS PCT: 0 % (ref 0–1)
Eosinophils Absolute: 0.1 10*3/uL (ref 0.0–0.7)
Eosinophils Relative: 1 % (ref 0–5)
HEMATOCRIT: 37.7 % (ref 36.0–46.0)
Hemoglobin: 12.9 g/dL (ref 12.0–15.0)
LYMPHS PCT: 26 % (ref 12–46)
Lymphs Abs: 1.6 10*3/uL (ref 0.7–4.0)
MCH: 29.6 pg (ref 26.0–34.0)
MCHC: 34.2 g/dL (ref 30.0–36.0)
MCV: 86.5 fL (ref 78.0–100.0)
MONO ABS: 0.4 10*3/uL (ref 0.1–1.0)
Monocytes Relative: 7 % (ref 3–12)
NEUTROS ABS: 3.9 10*3/uL (ref 1.7–7.7)
NEUTROS PCT: 66 % (ref 43–77)
Platelets: 243 10*3/uL (ref 150–400)
RBC: 4.36 MIL/uL (ref 3.87–5.11)
RDW: 13.2 % (ref 11.5–15.5)
WBC: 5.9 10*3/uL (ref 4.0–10.5)

## 2013-05-17 LAB — BASIC METABOLIC PANEL
BUN: 17 mg/dL (ref 6–23)
CHLORIDE: 96 meq/L (ref 96–112)
CO2: 23 meq/L (ref 19–32)
CREATININE: 0.67 mg/dL (ref 0.50–1.10)
Calcium: 9.8 mg/dL (ref 8.4–10.5)
GFR calc Af Amer: >90 mL/min (ref >90)
GFR calc non Af Amer: >90 mL/min (ref >90)
Glucose, Bld: 289 mg/dL — ABNORMAL HIGH (ref 70–99)
POTASSIUM: 4 meq/L (ref 3.7–5.3)
Sodium: 135 mEq/L — ABNORMAL LOW (ref 137–147)

## 2013-05-17 LAB — URINALYSIS, ROUTINE W REFLEX MICROSCOPIC
Bilirubin Urine: NEGATIVE
Glucose, UA: 500 mg/dL — AB
Ketones, ur: NEGATIVE mg/dL
LEUKOCYTES UA: NEGATIVE
Nitrite: NEGATIVE
PH: 8 (ref 5.0–8.0)
Protein, ur: NEGATIVE mg/dL
SPECIFIC GRAVITY, URINE: 1.01 (ref 1.005–1.030)
Urobilinogen, UA: 0.2 mg/dL (ref 0.0–1.0)

## 2013-05-17 LAB — HEPATIC FUNCTION PANEL
ALBUMIN: 3.7 g/dL (ref 3.5–5.2)
ALT: 14 U/L (ref 0–35)
AST: 14 U/L (ref 0–37)
Alkaline Phosphatase: 101 U/L (ref 39–117)
BILIRUBIN TOTAL: 0.5 mg/dL (ref 0.3–1.2)
Bilirubin, Direct: <0.2 mg/dL (ref 0.0–0.3)
Total Protein: 7.6 g/dL (ref 6.0–8.3)

## 2013-05-17 LAB — URINE MICROSCOPIC-ADD ON

## 2013-05-17 LAB — LIPASE, BLOOD: Lipase: 28 U/L (ref 11–59)

## 2013-05-17 MED ORDER — HYDROCODONE-ACETAMINOPHEN 5-325 MG PO TABS
ORAL_TABLET | ORAL | Status: DC
Start: 2013-05-17 — End: 2013-05-17
  Filled 2013-05-17: qty 1

## 2013-05-17 MED ORDER — ONDANSETRON HCL 4 MG/2ML IJ SOLN
4.0000 mg | Freq: Once | INTRAMUSCULAR | Status: AC
  Administered 2013-05-17: 4 mg via INTRAVENOUS
  Filled 2013-05-17: qty 2

## 2013-05-17 MED ORDER — IOHEXOL 300 MG/ML  SOLN
50.0000 mL | Freq: Once | INTRAMUSCULAR | Status: AC | PRN
  Administered 2013-05-17: 50 mL via ORAL

## 2013-05-17 MED ORDER — MORPHINE SULFATE 4 MG/ML IJ SOLN
4.0000 mg | Freq: Once | INTRAMUSCULAR | Status: AC
  Administered 2013-05-17: 4 mg via INTRAVENOUS
  Filled 2013-05-17: qty 1

## 2013-05-17 MED ORDER — IOHEXOL 300 MG/ML  SOLN
100.0000 mL | Freq: Once | INTRAMUSCULAR | Status: AC | PRN
  Administered 2013-05-17: 100 mL via INTRAVENOUS

## 2013-05-17 MED ORDER — HYDROCODONE-ACETAMINOPHEN 5-325 MG PO TABS: 2.0000 | ORAL_TABLET | ORAL | Status: DC | PRN

## 2013-05-17 MED ORDER — HYDROCODONE-ACETAMINOPHEN 5-325 MG PO TABS
1.0000 | ORAL_TABLET | Freq: Once | ORAL | Status: AC
  Administered 2013-05-17: 1 via ORAL

## 2013-05-17 NOTE — ED Provider Notes (Signed)
CSN: [id number]     Arrival date & time 05/16/13  2253 History  This chart was scribed for Veryl Speak, MD by Elby Beck, ED Scribe. This patient was seen in room APA11/APA11 and the patient's care was started at 12:45 AM.   Chief Complaint  Patient presents with  . Abdominal Pain  . Nausea    The history is provided by the patient. No language interpreter was used.    HPI Comments: Amanda Zamora is a 53 y.o. female with a history of T2DM and pyelonephritis who presents to the Emergency Department complaining of intermittent, severe RUQ abdominal pain over the past week. She states that the pain has been progressively worsening. She states that the pain radiates to her back. She reports associated nausea. She denies any surgical abdominal history. She denies fever, vomiting, diarrhea or any other symptoms.   Past Medical History  Diagnosis Date  . Type 2 diabetes mellitus   . History of pyelonephritis    History reviewed. No pertinent past surgical history. Family History  Problem Relation Age of Onset  . Diabetes Mellitus II Mother    History  Substance Use Topics  . Smoking status: Never Smoker   . Smokeless tobacco: Not on file  . Alcohol Use: No   OB History   Grav Para Term Preterm Abortions TAB SAB Ect Mult Living                 Review of Systems A complete 10 system review of systems was obtained and all systems are negative except as noted in the HPI and PMH.   Allergies  Review of patient's allergies indicates no known allergies.  Home Medications   Current Outpatient Rx  Name  Route  Sig  Dispense  Refill  . alum & mag hydroxide-simeth (MAALOX/MYLANTA) 200-200-20 MG/5ML suspension   Oral   Take by mouth every 6 (six) hours as needed for indigestion or heartburn.         Marland Kitchen ibuprofen (ADVIL,MOTRIN) 600 MG tablet   Oral   Take 600 mg by mouth daily as needed. For pain          . insulin NPH-insulin regular (NOVOLIN 70/30) (70-30) 100 UNIT/ML  injection   Subcutaneous   Inject 30 Units into the skin 2 (two) times daily.           . metFORMIN (GLUCOPHAGE) 1000 MG tablet   Oral   Take 1,000 mg by mouth 2 (two) times daily.           Marland Kitchen ALPRAZolam (XANAX) 0.25 MG tablet   Oral   Take 1 tablet (0.25 mg total) by mouth 2 (two) times daily as needed for anxiety.   15 tablet   0   . Vitamin D, Ergocalciferol, (DRISDOL) 50000 UNITS CAPS capsule   Oral   Take 50,000 Units by mouth every 7 (seven) days. On saturday          Triage Vitals: BP 148/73  Pulse 70  Temp(Src) 98.1 F (36.7 C) (Oral)  Resp 20  Ht 5\' 3"  (1.6 m)  Wt 170 lb (77.111 kg)  BMI 30.12 kg/m2  SpO2 100%  Physical Exam  Nursing note and vitals reviewed. Constitutional: She is oriented to person, place, and time. She appears well-developed and well-nourished. No distress.  HENT:  Head: Normocephalic and atraumatic.  Eyes: EOM are normal. Pupils are equal, round, and reactive to light.  Neck: Normal range of motion. Neck supple. No tracheal deviation present.  Cardiovascular: Normal rate.   Pulmonary/Chest: Effort normal. No respiratory distress.  Abdominal: Soft. There is tenderness. There is no rebound and no guarding.  Tender to palpation in the right upper quadrant.  Musculoskeletal: Normal range of motion.  Neurological: She is alert and oriented to person, place, and time.  Skin: Skin is warm and dry.  Psychiatric: She has a normal mood and affect. Her behavior is normal.    ED Course  Procedures (including critical care time)  DIAGNOSTIC STUDIES: Oxygen Saturation is 100% on RA, normal by my interpretation.    COORDINATION OF CARE: 12:46 AM- Discussed clinical suspicion that pt's symptoms may be related to her gallbladder. Discussed plan to obtain diagnostic lab work and radiology. Will also order medicaiton for pain. Pt advised of plan for treatment and pt agrees.  Medications  morphine 4 MG/ML injection 4 mg (not administered)   ondansetron (ZOFRAN) injection 4 mg (not administered)    Labs Review Labs Reviewed  CBC WITH DIFFERENTIAL  BASIC METABOLIC PANEL  URINALYSIS, ROUTINE W REFLEX MICROSCOPIC  LIPASE, BLOOD  HEPATIC FUNCTION PANEL  CBG MONITORING, ED   Imaging Review No results found.   EKG Interpretation None      MDM   Final diagnoses:  None    And presents here with complaints of abdominal pain for the past week. She has felt bloated and now she is having severe pain in the right upper quadrant. She denies any fevers or chills. Physical examination reveals tenderness in the right upper quadrant however there is no rebound or guarding. Workup reveals no elevation of white count normal liver functions and electrolytes, unremarkable urinalysis, and CT scan that shows no acute intra-abdominal pathology. I am unsure as to what the etiology of her discomfort is however nothing appears emergent. She will be prescribed pain medication and advised to followup with her Dr. if not improving in the next several days and return to the ER if her symptoms substantially worsen or change.   I personally performed the services described in this documentation, which was scribed in my presence. The recorded information has been reviewed and is accurate.     Veryl Speak, MD 05/17/13 [phone]

## 2013-05-17 NOTE — Discharge Instructions (Signed)
Hydrocodone as prescribed as needed for pain.  Followup with your primary Dr. if not improving in the next several days and return to the ER if he develops severe pain, high fever, or the stool, or other new and concerning symptoms.   Abdominal Pain, Adult Many things can cause abdominal pain. Usually, abdominal pain is not caused by a disease and will improve without treatment. It can often be observed and treated at home. Your health care provider will do a physical exam and possibly order blood tests and X-rays to help determine the seriousness of your pain. However, in many cases, more time must pass before a clear cause of the pain can be found. Before that point, your health care provider may not know if you need more testing or further treatment. HOME CARE INSTRUCTIONS  Monitor your abdominal pain for any changes. The following actions may help to alleviate any discomfort you are experiencing:  Only take over-the-counter or prescription medicines as directed by your health care provider.  Do not take laxatives unless directed to do so by your health care provider.  Try a clear liquid diet (broth, tea, or water) as directed by your health care provider. Slowly move to a bland diet as tolerated. SEEK MEDICAL CARE IF:  You have unexplained abdominal pain.  You have abdominal pain associated with nausea or diarrhea.  You have pain when you urinate or have a bowel movement.  You experience abdominal pain that wakes you in the night.  You have abdominal pain that is worsened or improved by eating food.  You have abdominal pain that is worsened with eating fatty foods. SEEK IMMEDIATE MEDICAL CARE IF:   Your pain does not go away within 2 hours.  You have a fever.  You keep throwing up (vomiting).  Your pain is felt only in portions of the abdomen, such as the right side or the left lower portion of the abdomen.  You pass bloody or black tarry stools. MAKE SURE  YOU:  Understand these instructions.   Will watch your condition.   Will get help right away if you are not doing well or get worse.  Document Released: 11/10/2004 Document Revised: 11/21/2012 Document Reviewed: 10/10/2012 [facility] Patient Information 2014 [place].

## 2013-05-27 LAB — HM PAP SMEAR

## 2013-10-23 LAB — HM DIABETES FOOT EXAM

## 2013-11-11 ENCOUNTER — Other Ambulatory Visit (HOSPITAL_COMMUNITY): Payer: Self-pay | Admitting: Physician Assistant

## 2013-11-11 DIAGNOSIS — Z1231 Encounter for screening mammogram for malignant neoplasm of breast: Secondary | ICD-10-CM

## 2013-11-25 ENCOUNTER — Ambulatory Visit (HOSPITAL_COMMUNITY): Payer: Self-pay

## 2013-12-07 ENCOUNTER — Encounter (HOSPITAL_COMMUNITY): Payer: Self-pay | Admitting: Emergency Medicine

## 2013-12-07 ENCOUNTER — Emergency Department (HOSPITAL_COMMUNITY)
Admission: EM | Admit: 2013-12-07 | Discharge: 2013-12-07 | Disposition: A | Discharge status: Home / Self Care | Payer: Self-pay | Attending: Emergency Medicine | Admitting: Emergency Medicine

## 2013-12-07 DIAGNOSIS — Z3202 Encounter for pregnancy test, result negative: Secondary | ICD-10-CM | POA: Insufficient documentation

## 2013-12-07 DIAGNOSIS — R109 Unspecified abdominal pain: Secondary | ICD-10-CM | POA: Insufficient documentation

## 2013-12-07 DIAGNOSIS — Z79899 Other long term (current) drug therapy: Secondary | ICD-10-CM | POA: Insufficient documentation

## 2013-12-07 DIAGNOSIS — Z794 Long term (current) use of insulin: Secondary | ICD-10-CM | POA: Insufficient documentation

## 2013-12-07 DIAGNOSIS — R1110 Vomiting, unspecified: Secondary | ICD-10-CM | POA: Insufficient documentation

## 2013-12-07 DIAGNOSIS — R197 Diarrhea, unspecified: Secondary | ICD-10-CM | POA: Insufficient documentation

## 2013-12-07 DIAGNOSIS — E119 Type 2 diabetes mellitus without complications: Secondary | ICD-10-CM | POA: Insufficient documentation

## 2013-12-07 DIAGNOSIS — Z87448 Personal history of other diseases of urinary system: Secondary | ICD-10-CM | POA: Insufficient documentation

## 2013-12-07 DIAGNOSIS — R6883 Chills (without fever): Secondary | ICD-10-CM | POA: Insufficient documentation

## 2013-12-07 DIAGNOSIS — Z791 Long term (current) use of non-steroidal anti-inflammatories (NSAID): Secondary | ICD-10-CM | POA: Insufficient documentation

## 2013-12-07 LAB — CBC WITH DIFFERENTIAL/PLATELET
BASOS ABS: 0 10*3/uL (ref 0.0–0.1)
Basophils Relative: 0 % (ref 0–1)
EOS PCT: 0 % (ref 0–5)
Eosinophils Absolute: 0 10*3/uL (ref 0.0–0.7)
HCT: 38.9 % (ref 36.0–46.0)
Hemoglobin: 13 g/dL (ref 12.0–15.0)
LYMPHS PCT: 6 % — AB (ref 12–46)
Lymphs Abs: 0.5 10*3/uL — ABNORMAL LOW (ref 0.7–4.0)
MCH: 29.3 pg (ref 26.0–34.0)
MCHC: 33.4 g/dL (ref 30.0–36.0)
MCV: 87.6 fL (ref 78.0–100.0)
Monocytes Absolute: 0.5 10*3/uL (ref 0.1–1.0)
Monocytes Relative: 6 % (ref 3–12)
Neutro Abs: 7.9 10*3/uL — ABNORMAL HIGH (ref 1.7–7.7)
Neutrophils Relative %: 88 % — ABNORMAL HIGH (ref 43–77)
PLATELETS: 262 10*3/uL (ref 150–400)
RBC: 4.44 MIL/uL (ref 3.87–5.11)
RDW: 13.5 % (ref 11.5–15.5)
WBC: 8.9 10*3/uL (ref 4.0–10.5)

## 2013-12-07 LAB — COMPREHENSIVE METABOLIC PANEL
ALT: 19 U/L (ref 0–35)
AST: 21 U/L (ref 0–37)
Albumin: 3.9 g/dL (ref 3.5–5.2)
Alkaline Phosphatase: 88 U/L (ref 39–117)
Anion gap: 16 — ABNORMAL HIGH (ref 5–15)
BUN: 25 mg/dL — ABNORMAL HIGH (ref 6–23)
CO2: 25 meq/L (ref 19–32)
Calcium: 9.5 mg/dL (ref 8.4–10.5)
Chloride: 100 mEq/L (ref 96–112)
Creatinine, Ser: 0.87 mg/dL (ref 0.50–1.10)
GFR calc Af Amer: 87 mL/min — ABNORMAL LOW (ref >90)
GFR calc non Af Amer: 75 mL/min — ABNORMAL LOW (ref >90)
Glucose, Bld: 136 mg/dL — ABNORMAL HIGH (ref 70–99)
POTASSIUM: 4.1 meq/L (ref 3.7–5.3)
SODIUM: 141 meq/L (ref 137–147)
TOTAL PROTEIN: 8.1 g/dL (ref 6.0–8.3)
Total Bilirubin: 0.5 mg/dL (ref 0.3–1.2)

## 2013-12-07 LAB — URINALYSIS, ROUTINE W REFLEX MICROSCOPIC
Bilirubin Urine: NEGATIVE
Glucose, UA: NEGATIVE mg/dL
Ketones, ur: NEGATIVE mg/dL
LEUKOCYTES UA: NEGATIVE
Nitrite: NEGATIVE
PH: 7.5 (ref 5.0–8.0)
Protein, ur: 100 mg/dL — AB
SPECIFIC GRAVITY, URINE: 1.025 (ref 1.005–1.030)
UROBILINOGEN UA: 0.2 mg/dL (ref 0.0–1.0)

## 2013-12-07 LAB — URINE MICROSCOPIC-ADD ON

## 2013-12-07 LAB — LIPASE, BLOOD: Lipase: 27 U/L (ref 11–59)

## 2013-12-07 LAB — TROPONIN I

## 2013-12-07 LAB — POC URINE PREG, ED: Preg Test, Ur: NEGATIVE

## 2013-12-07 MED ORDER — SODIUM CHLORIDE 0.9 % IV BOLUS (SEPSIS)
500.0000 mL | Freq: Once | INTRAVENOUS | Status: AC
  Administered 2013-12-07: 500 mL via INTRAVENOUS

## 2013-12-07 MED ORDER — ONDANSETRON HCL 4 MG/2ML IJ SOLN
4.0000 mg | Freq: Once | INTRAMUSCULAR | Status: AC
  Administered 2013-12-07: 4 mg via INTRAVENOUS
  Filled 2013-12-07: qty 2

## 2013-12-07 MED ORDER — MORPHINE SULFATE 4 MG/ML IJ SOLN
4.0000 mg | Freq: Once | INTRAMUSCULAR | Status: AC
  Administered 2013-12-07: 4 mg via INTRAVENOUS
  Filled 2013-12-07: qty 1

## 2013-12-07 NOTE — ED Notes (Signed)
Pt tolerating po fluids well  

## 2013-12-07 NOTE — ED Notes (Signed)
Pt c/o abdominal pain and nausea x 3 hrs.

## 2013-12-07 NOTE — Discharge Instructions (Signed)
Abdominal Pain, Women °Abdominal (stomach, pelvic, or belly) pain can be caused by many things. It is important to tell your doctor: °· The location of the pain. °· Does it come and go or is it present all the time? °· Are there things that start the pain (eating certain foods, exercise)? °· Are there other symptoms associated with the pain (fever, nausea, vomiting, diarrhea)? °All of this is helpful to know when trying to find the cause of the pain. °CAUSES  °· Stomach: virus or bacteria infection, or ulcer. °· Intestine: appendicitis (inflamed appendix), regional ileitis (Crohn's disease), ulcerative colitis (inflamed colon), irritable bowel syndrome, diverticulitis (inflamed diverticulum of the colon), or cancer of the stomach or intestine. °· Gallbladder disease or stones in the gallbladder. °· Kidney disease, kidney stones, or infection. °· Pancreas infection or cancer. °· Fibromyalgia (pain disorder). °· Diseases of the female organs: °¨ Uterus: fibroid (non-cancerous) tumors or infection. °¨ Fallopian tubes: infection or tubal pregnancy. °¨ Ovary: cysts or tumors. °¨ Pelvic adhesions (scar tissue). °¨ Endometriosis (uterus lining tissue growing in the pelvis and on the pelvic organs). °¨ Pelvic congestion syndrome (female organs filling up with blood just before the menstrual period). °¨ Pain with the menstrual period. °¨ Pain with ovulation (producing an egg). °¨ Pain with an IUD (intrauterine device, birth control) in the uterus. °¨ Cancer of the female organs. °· Functional pain (pain not caused by a disease, may improve without treatment). °· Psychological pain. °· Depression. °DIAGNOSIS  °Your doctor will decide the seriousness of your pain by doing an examination. °· Blood tests. °· X-rays. °· Ultrasound. °· CT scan (computed tomography, special type of X-ray). °· MRI (magnetic resonance imaging). °· Cultures, for infection. °· Barium enema (dye inserted in the large intestine, to better view it with  X-rays). °· Colonoscopy (looking in intestine with a lighted tube). °· Laparoscopy (minor surgery, looking in abdomen with a lighted tube). °· Major abdominal exploratory surgery (looking in abdomen with a large incision). °TREATMENT  °The treatment will depend on the cause of the pain.  °· Many cases can be observed and treated at home. °· Over-the-counter medicines recommended by your caregiver. °· Prescription medicine. °· Antibiotics, for infection. °· Birth control pills, for painful periods or for ovulation pain. °· Hormone treatment, for endometriosis. °· Nerve blocking injections. °· Physical therapy. °· Antidepressants. °· Counseling with a psychologist or psychiatrist. °· Minor or major surgery. °HOME CARE INSTRUCTIONS  °· Do not take laxatives, unless directed by your caregiver. °· Take over-the-counter pain medicine only if ordered by your caregiver. Do not take aspirin because it can cause an upset stomach or bleeding. °· Try a clear liquid diet (broth or water) as ordered by your caregiver. Slowly move to a bland diet, as tolerated, if the pain is related to the stomach or intestine. °· Have a thermometer and take your temperature several times a day, and record it. °· Bed rest and sleep, if it helps the pain. °· Avoid sexual intercourse, if it causes pain. °· Avoid stressful situations. °· Keep your follow-up appointments and tests, as your caregiver orders. °· If the pain does not go away with medicine or surgery, you may try: °¨ Acupuncture. °¨ Relaxation exercises (yoga, meditation). °¨ Group therapy. °¨ Counseling. °SEEK MEDICAL CARE IF:  °· You notice certain foods cause stomach pain. °· Your home care treatment is not helping your pain. °· You need stronger pain medicine. °· You want your IUD removed. °· You feel faint or   lightheaded. °· You develop nausea and vomiting. °· You develop a rash. °· You are having side effects or an allergy to your medicine. °SEEK IMMEDIATE MEDICAL CARE IF:  °· Your  pain does not go away or gets worse. °· You have a fever. °· Your pain is felt only in portions of the abdomen. The right side could possibly be appendicitis. The left lower portion of the abdomen could be colitis or diverticulitis. °· You are passing blood in your stools (bright red or black tarry stools, with or without vomiting). °· You have blood in your urine. °· You develop chills, with or without a fever. °· You pass out. °MAKE SURE YOU:  °· Understand these instructions. °· Will watch your condition. °· Will get help right away if you are not doing well or get worse. °Document Released: 11/28/2006 Document Revised: 06/17/2013 Document Reviewed: 12/18/2008 °[employer]® Patient Information ©2015 [employer]. This information is not intended to replace advice given to you by your health care provider. Make sure you discuss any questions you have with your health care provider. ° °

## 2013-12-07 NOTE — ED Notes (Signed)
Pt given diet ginger ale.

## 2013-12-07 NOTE — ED Notes (Signed)
Pt given 2nd blanket.

## 2013-12-07 NOTE — ED Provider Notes (Signed)
CSN: [id number]     Arrival date & time 12/07/13  0132 History   First MD Initiated Contact with Patient 12/07/13 [phone]     Chief Complaint  Patient presents with  . Abdominal Pain     Patient is a 53 y.o. female presenting with abdominal pain. The history is provided by the patient and a relative.  Abdominal Pain Pain location:  Generalized Pain quality: aching   Pain radiates to:  Does not radiate Pain severity:  Moderate Onset quality:  Gradual Duration:  3 hours Timing:  Constant Progression:  Unchanged Relieved by:  None tried Worsened by:  Nothing tried Associated symptoms: chills, diarrhea, nausea and vomiting   Associated symptoms: no chest pain, no melena and no shortness of breath   PT PRESENTS FOR ABDOMINAL PAIN/VOMITING/DIARRHEA THAT STARTED JUST PRIOR TO ARRIVAL NO BLOOD REPORTED IN STOOL/VOMIT NO SICK CONTACTS NO TRAVEL PT HAD OTHERWISE BEEN WELL   Past Medical History  Diagnosis Date  . Type 2 diabetes mellitus   . History of pyelonephritis    History reviewed. No pertinent past surgical history. Family History  Problem Relation Age of Onset  . Diabetes Mellitus II Mother    History  Substance Use Topics  . Smoking status: Never Smoker   . Smokeless tobacco: Not on file  . Alcohol Use: No   OB History   Grav Para Term Preterm Abortions TAB SAB Ect Mult Living                 Review of Systems  Constitutional: Positive for chills.  Respiratory: Negative for shortness of breath.   Cardiovascular: Negative for chest pain.  Gastrointestinal: Positive for nausea, vomiting, abdominal pain and diarrhea. Negative for blood in stool and melena.  All other systems reviewed and are negative.     Allergies  Review of patient's allergies indicates no known allergies.  Home Medications   Prior to Admission medications   Medication Sig Start Date End Date Taking? Authorizing Provider  ALPRAZolam (XANAX) 0.25 MG tablet Take 1 tablet (0.25 mg total) by  mouth 2 (two) times daily as needed for anxiety. 03/29/13   Radene Gunning, NP  alum & mag hydroxide-simeth (MAALOX/MYLANTA) 200-200-20 MG/5ML suspension Take by mouth every 6 (six) hours as needed for indigestion or heartburn.    Historical Provider, MD  HYDROcodone-acetaminophen (NORCO) 5-325 MG per tablet Take 2 tablets by mouth every 4 (four) hours as needed. 05/17/13   Veryl Speak, MD  ibuprofen (ADVIL,MOTRIN) 600 MG tablet Take 600 mg by mouth daily as needed. For pain     Historical Provider, MD  insulin NPH-insulin regular (NOVOLIN 70/30) (70-30) 100 UNIT/ML injection Inject 30 Units into the skin 2 (two) times daily.      Historical Provider, MD  metFORMIN (GLUCOPHAGE) 1000 MG tablet Take 1,000 mg by mouth 2 (two) times daily.      Historical Provider, MD  Vitamin D, Ergocalciferol, (DRISDOL) 50000 UNITS CAPS capsule Take 50,000 Units by mouth every 7 (seven) days. On saturday    Historical Provider, MD   BP 136/88  Pulse 86  Temp(Src) 98.6 F (37 C)  SpO2 100% Physical Exam CONSTITUTIONAL: Well developed/well nourished HEAD: Normocephalic/atraumatic EYES: EOMI/PERRL, no icterus ENMT: Mucous membranes dry NECK: supple no meningeal signs SPINE:entire spine nontender CV: S1/S2 noted, no murmurs/rubs/gallops noted LUNGS: Lungs are clear to auscultation bilaterally, no apparent distress ABDOMEN: soft, nontender, no rebound or guarding GU:no cva tenderness NEURO: Pt is awake/alert, moves all extremitiesx4 EXTREMITIES: pulses normal,  full ROM SKIN: warm, color normal PSYCH: no abnormalities of mood noted  ED Course  Procedures   6:04 AM Pt well appearing, no focal abdominal tenderness, reports both vomiting/diarrhea She is taking PO She speaks/understands english during interview   Pt well appearing No further vomiting She did have some diarrhea in the ED but this resolved She took PO abd soft without focal tenderness We discussed strict return precautions Appropriate for  d/c home Labs Review Labs Reviewed  CBC WITH DIFFERENTIAL - Abnormal; Notable for the following:    Neutrophils Relative % 88 (*)    Neutro Abs 7.9 (*)    Lymphocytes Relative 6 (*)    Lymphs Abs 0.5 (*)    All other components within normal limits  COMPREHENSIVE METABOLIC PANEL - Abnormal; Notable for the following:    Glucose, Bld 136 (*)    BUN 25 (*)    GFR calc non Af Amer 75 (*)    GFR calc Af Amer 87 (*)    Anion gap 16 (*)    All other components within normal limits  URINALYSIS, ROUTINE W REFLEX MICROSCOPIC - Abnormal; Notable for the following:    APPearance HAZY (*)    Hgb urine dipstick TRACE (*)    Protein, ur 100 (*)    All other components within normal limits  URINE MICROSCOPIC-ADD ON - Abnormal; Notable for the following:    Bacteria, UA MANY (*)    All other components within normal limits  LIPASE, BLOOD  TROPONIN I  POC URINE PREG, ED      EKG Interpretation   Date/Time:  Saturday December 07 2013 02:03:19 EDT Ventricular Rate:  85 PR Interval:  133 QRS Duration: 78 QT Interval:  385 QTC Calculation: 458 R Axis:   94 Text Interpretation:  Sinus rhythm Borderline right axis deviation  Borderline low voltage, extremity leads No significant change since last  tracing Confirmed by Christy Gentles  MD, Elenore Rota ([id number]) on 12/07/2013 2:13:20 AM      MDM   Final diagnoses:  None    Nursing notes including past medical history and social history reviewed and considered in documentation Labs/vital reviewed and considered     Sharyon Cable, MD 12/07/13 [phone]

## 2014-05-28 ENCOUNTER — Other Ambulatory Visit (HOSPITAL_COMMUNITY): Payer: Self-pay | Admitting: Physician Assistant

## 2014-05-28 DIAGNOSIS — Z1231 Encounter for screening mammogram for malignant neoplasm of breast: Secondary | ICD-10-CM

## 2014-06-26 ENCOUNTER — Ambulatory Visit (HOSPITAL_COMMUNITY)
Admission: RE | Admit: 2014-06-26 | Discharge: 2014-06-26 | Disposition: A | Discharge status: Home / Self Care | Payer: Self-pay | Source: Ambulatory Visit | Attending: Physician Assistant | Admitting: Physician Assistant

## 2014-06-26 DIAGNOSIS — Z1231 Encounter for screening mammogram for malignant neoplasm of breast: Secondary | ICD-10-CM

## 2014-06-26 LAB — HM MAMMOGRAPHY

## 2014-09-24 LAB — HM DIABETES EYE EXAM

## 2014-11-22 ENCOUNTER — Other Ambulatory Visit: Payer: Self-pay | Admitting: Physician Assistant

## 2014-11-22 LAB — COMPREHENSIVE METABOLIC PANEL
ALT: 16 U/L (ref 6–29)
AST: 16 U/L (ref 10–35)
Albumin: 3.9 g/dL (ref 3.6–5.1)
Alkaline Phosphatase: 81 U/L (ref 33–130)
BUN: 17 mg/dL (ref 7–25)
CHLORIDE: 102 mmol/L (ref 98–110)
CO2: 30 mmol/L (ref 20–31)
CREATININE: 0.94 mg/dL (ref 0.50–1.05)
Calcium: 9.6 mg/dL (ref 8.6–10.4)
Glucose, Bld: 136 mg/dL — ABNORMAL HIGH (ref 65–99)
POTASSIUM: 4.8 mmol/L (ref 3.5–5.3)
SODIUM: 136 mmol/L (ref 135–146)
Total Bilirubin: 0.5 mg/dL (ref 0.2–1.2)
Total Protein: 7.4 g/dL (ref 6.1–8.1)

## 2014-11-22 LAB — LIPID PANEL
CHOL/HDL RATIO: 4.4 ratio (ref <=5.0)
Cholesterol: 198 mg/dL (ref 125–200)
HDL: 45 mg/dL — ABNORMAL LOW (ref >=46)
LDL CALC: 123 mg/dL (ref <130)
Triglycerides: 149 mg/dL (ref <150)
VLDL: 30 mg/dL (ref <30)

## 2014-11-23 LAB — HEMOGLOBIN A1C
Hgb A1c MFr Bld: 8 % — ABNORMAL HIGH (ref <5.7)
MEAN PLASMA GLUCOSE: 183 mg/dL — AB (ref <117)

## 2014-11-24 ENCOUNTER — Ambulatory Visit: Payer: Self-pay | Admitting: Physician Assistant

## 2014-11-27 ENCOUNTER — Telehealth: Payer: Self-pay | Admitting: Student

## 2014-11-28 ENCOUNTER — Other Ambulatory Visit: Payer: Self-pay | Admitting: Physician Assistant

## 2014-12-01 ENCOUNTER — Telehealth: Payer: Self-pay | Admitting: Student

## 2014-12-01 NOTE — Telephone Encounter (Signed)
Called pt to reschedule. Pt did not answer. Left voicemail to call back

## 2014-12-01 NOTE — Telephone Encounter (Signed)
called pt to get info for enrollement to services for the blind. pt did not answer, so I left voicemail for her to call back

## 2014-12-08 ENCOUNTER — Encounter: Payer: Self-pay | Admitting: Physician Assistant

## 2014-12-08 ENCOUNTER — Ambulatory Visit: Payer: Self-pay | Admitting: Physician Assistant

## 2014-12-08 VITALS — BP 136/70 | HR 63 | Temp 97.7°F | Ht 61.0 in | Wt 173.6 lb

## 2014-12-08 DIAGNOSIS — E1139 Type 2 diabetes mellitus with other diabetic ophthalmic complication: Secondary | ICD-10-CM

## 2014-12-08 DIAGNOSIS — IMO0002 Reserved for concepts with insufficient information to code with codable children: Secondary | ICD-10-CM

## 2014-12-08 DIAGNOSIS — B353 Tinea pedis: Secondary | ICD-10-CM

## 2014-12-08 DIAGNOSIS — Z794 Long term (current) use of insulin: Secondary | ICD-10-CM

## 2014-12-08 DIAGNOSIS — E1165 Type 2 diabetes mellitus with hyperglycemia: Secondary | ICD-10-CM

## 2014-12-08 DIAGNOSIS — E118 Type 2 diabetes mellitus with unspecified complications: Secondary | ICD-10-CM

## 2014-12-08 DIAGNOSIS — I10 Essential (primary) hypertension: Secondary | ICD-10-CM

## 2014-12-08 DIAGNOSIS — E785 Hyperlipidemia, unspecified: Secondary | ICD-10-CM

## 2014-12-08 NOTE — Progress Notes (Signed)
BP 136/70 mmHg  Pulse 63  Temp(Src) 97.7 F (36.5 C)  Ht 5\' 1"  (1.549 m)  Wt 173 lb 9.6 oz (78.744 kg)  BMI 32.82 kg/m2  SpO2 97%   Subjective:    Patient ID: Amanda Zamora, female    DOB: 1961/01/15, 54 y.o.   MRN: [id number]  HPI: Amanda Zamora is a 54 y.o. female presenting on 12/08/2014 for Diabetes; Hypertension; and Hyperlipidemia   HPI   Chief Complaint  Patient presents with  . Diabetes  . Hypertension  . Hyperlipidemia    Pt needs eye surgery and we are working to get her in  Touch with services for the blind  Relevant past medical, surgical, family and social history reviewed and updated as indicated. Interim medical history since our last visit reviewed. Allergies and medications reviewed and updated.   Current outpatient prescriptions:  .  gabapentin (NEURONTIN) 300 MG capsule, Take 300 mg by mouth Nightly., Disp: , Rfl:  .  insulin NPH-insulin regular (NOVOLIN 70/30) (70-30) 100 UNIT/ML injection, Inject 47 Units into the skin 2 (two) times daily. , Disp: , Rfl:  .  losartan (COZAAR) 50 MG tablet, Take 50 mg by mouth daily., Disp: , Rfl:  .  lovastatin (MEVACOR) 20 MG tablet, Take 20 mg by mouth at bedtime., Disp: , Rfl:  .  metFORMIN (GLUCOPHAGE) 1000 MG tablet, Take 1,000 mg by mouth 2 (two) times daily.  , Disp: , Rfl:  .  Omega-3 Fatty Acids (FISH OIL PO), Take 2 tablets by mouth daily., Disp: , Rfl:  .  pantoprazole (PROTONIX) 40 MG tablet, Take 40 mg by mouth daily., Disp: , Rfl:  .  Vitamin D, Ergocalciferol, (DRISDOL) 50000 UNITS CAPS capsule, Take 50,000 Units by mouth every 7 (seven) days. On saturday, Disp: , Rfl:     Review of Systems  Constitutional: Positive for diaphoresis and appetite change. Negative for fever, chills, fatigue and unexpected weight change.  HENT: Negative for congestion, dental problem, drooling, ear pain, facial swelling, hearing loss, mouth sores, sneezing, sore throat, trouble swallowing and voice change.   Eyes:  Positive for visual disturbance. Negative for pain, discharge, redness and itching.  Respiratory: Negative for cough, choking, shortness of breath and wheezing.   Cardiovascular: Negative for chest pain, palpitations and leg swelling.  Gastrointestinal: Negative for vomiting, abdominal pain, diarrhea, constipation and blood in stool.  Endocrine: Negative for cold intolerance, heat intolerance and polydipsia.  Genitourinary: Negative for dysuria, hematuria and decreased urine volume.  Musculoskeletal: Negative for back pain, arthralgias and gait problem.  Skin: Negative for rash.  Allergic/Immunologic: Negative for environmental allergies.  Neurological: Negative for seizures, syncope, light-headedness and headaches.  Hematological: Negative for adenopathy.  Psychiatric/Behavioral: Positive for dysphoric mood. Negative for suicidal ideas and agitation. The patient is not nervous/anxious.     Per HPI unless specifically indicated above     Objective:    BP 136/70 mmHg  Pulse 63  Temp(Src) 97.7 F (36.5 C)  Ht 5\' 1"  (1.549 m)  Wt 173 lb 9.6 oz (78.744 kg)  BMI 32.82 kg/m2  SpO2 97%  Wt Readings from Last 3 Encounters:  12/08/14 173 lb 9.6 oz (78.744 kg)  09/22/14 174 lb (78.926 kg)  05/16/13 170 lb (77.111 kg)    Physical Exam  Constitutional: She is oriented to person, place, and time. She appears well-developed and well-nourished.  HENT:  Head: Normocephalic and atraumatic.  Neck: Neck supple.  Cardiovascular: Normal rate and regular rhythm.   Pulmonary/Chest: Effort normal and  breath sounds normal.  Abdominal: Soft. Bowel sounds are normal. She exhibits no mass. There is no tenderness.  Musculoskeletal: She exhibits no edema.  Lymphadenopathy:    She has no cervical adenopathy.  Neurological: She is alert and oriented to person, place, and time.  Skin: Skin is warm and dry.     Tinea webspace between R 4th and 5th toes. No maceration or secondary infection.    Psychiatric: She has a normal mood and affect. Her behavior is normal.  Vitals reviewed. foot exam done  Results for orders placed or performed in visit on 11/22/14  Comprehensive metabolic panel  Result Value Ref Range   Sodium 136 135 - 146 mmol/L   Potassium 4.8 3.5 - 5.3 mmol/L   Chloride 102 98 - 110 mmol/L   CO2 30 20 - 31 mmol/L   Glucose, Bld 136 (H) 65 - 99 mg/dL   BUN 17 7 - 25 mg/dL   Creat 0.94 0.50 - 1.05 mg/dL   Total Bilirubin 0.5 0.2 - 1.2 mg/dL   Alkaline Phosphatase 81 33 - 130 U/L   AST 16 10 - 35 U/L   ALT 16 6 - 29 U/L   Total Protein 7.4 6.1 - 8.1 g/dL   Albumin 3.9 3.6 - 5.1 g/dL   Calcium 9.6 8.6 - 10.4 mg/dL  Lipid panel  Result Value Ref Range   Cholesterol 198 125 - 200 mg/dL   Triglycerides 149 <150 mg/dL   HDL 45 (L) >=46 mg/dL   Total CHOL/HDL Ratio 4.4 <=5.0 Ratio   VLDL 30 <30 mg/dL   LDL Cholesterol 123 <130 mg/dL  Hemoglobin A1c  Result Value Ref Range   Hgb A1c MFr Bld 8.0 (H) <5.7 %   Mean Plasma Glucose 183 (H) <117 mg/dL      Assessment & Plan:   Encounter Diagnoses  Name Primary?  . Type 2 diabetes mellitus with other ophthalmic complication ([place]) Yes  . Uncontrolled type 2 diabetes mellitus with complication, with long-term current use of insulin ([place])   . Essential hypertension, benign   . Hyperlipemia   . Tinea pedis of right foot       -Reviewed labs with pt. a1c improved but still not at goal of 7.  -Increase to 48 u novolin daily -f/u 3 mo. rto sooner prn

## 2014-12-08 NOTE — Patient Instructions (Signed)
Athletes foot cream as directed

## 2014-12-11 ENCOUNTER — Telehealth: Payer: Self-pay | Admitting: Student

## 2014-12-11 NOTE — Telephone Encounter (Signed)
Called pt to make her aware that I had spoke to [facility] about getting her referred for Services for the Blind. Amanda Zamora is to send pt an application, and pt is to fill it out and return it with pay stubs. Pt understood and will notify me when she sends application back to [facility]

## 2014-12-11 NOTE — Telephone Encounter (Signed)
Called pt and left voicemail to return my call, so I can ask her to come to the clinic to sign a Records Release Form.

## 2015-01-05 ENCOUNTER — Other Ambulatory Visit: Payer: Self-pay | Admitting: Physician Assistant

## 2015-03-10 ENCOUNTER — Encounter: Payer: Self-pay | Admitting: Physician Assistant

## 2015-03-10 ENCOUNTER — Ambulatory Visit: Payer: Self-pay | Admitting: Physician Assistant

## 2015-03-10 VITALS — BP 136/70 | HR 82 | Temp 97.3°F | Ht 61.0 in | Wt 167.4 lb

## 2015-03-10 DIAGNOSIS — E785 Hyperlipidemia, unspecified: Secondary | ICD-10-CM

## 2015-03-10 DIAGNOSIS — E1139 Type 2 diabetes mellitus with other diabetic ophthalmic complication: Secondary | ICD-10-CM

## 2015-03-10 DIAGNOSIS — E669 Obesity, unspecified: Secondary | ICD-10-CM

## 2015-03-10 DIAGNOSIS — K219 Gastro-esophageal reflux disease without esophagitis: Secondary | ICD-10-CM | POA: Insufficient documentation

## 2015-03-10 DIAGNOSIS — I10 Essential (primary) hypertension: Secondary | ICD-10-CM

## 2015-03-10 DIAGNOSIS — E1140 Type 2 diabetes mellitus with diabetic neuropathy, unspecified: Secondary | ICD-10-CM

## 2015-03-10 NOTE — Progress Notes (Signed)
BP 136/70 mmHg  Pulse 82  Temp(Src) 97.3 F (36.3 C)  Ht 5\' 1"  (1.549 m)  Wt 167 lb 6.4 oz (75.932 kg)  BMI 31.65 kg/m2  SpO2 98%   Subjective:    Patient ID: Amanda Zamora, female    DOB: 13-May-1960, 55 y.o.   MRN: [id number]  HPI: Amanda Zamora is a 55 y.o. female presenting on 03/10/2015 for Diabetes; Hypertension; and Hyperlipidemia   HPI Pt says she is doing okay.  She has random bs written on a piece of paper ranging from 432-89.  Her last a1c was 8.0  Pt is still trying to get things worked out with the services for the blind.  Pt states she has insurance now.    Relevant past medical, surgical, family and social history reviewed and updated as indicated. Interim medical history since our last visit reviewed. Allergies and medications reviewed and updated.   Current outpatient prescriptions:  .  gabapentin (NEURONTIN) 300 MG capsule, Take 1 capsule (300 mg total) by mouth 2 (two) times daily. Tome una tableta por [employer] veces diarias, Disp: 60 capsule, Rfl: 1 .  insulin NPH-insulin regular (NOVOLIN 70/30) (70-30) 100 UNIT/ML injection, Inject 49 Units into the skin 2 (two) times daily. , Disp: , Rfl:  .  losartan (COZAAR) 50 MG tablet, TAKE 1 TABLET BY MOUTH EVERY DAY FOR BLOOD PRESSURE, Disp: 30 tablet, Rfl: 0 .  lovastatin (MEVACOR) 20 MG tablet, Take 20 mg by mouth at bedtime., Disp: , Rfl:  .  metFORMIN (GLUCOPHAGE) 1000 MG tablet, Take 1 tablet (1,000 mg total) by mouth 2 (two) times daily with a meal. Tome una tableta por boca dos veces diarias con comida, Disp: 60 tablet, Rfl: 2 .  Omega-3 Fatty Acids (FISH OIL PO), Take 2 tablets by mouth daily., Disp: , Rfl:  .  ranitidine (ZANTAC) 150 MG capsule, Take 150 mg by mouth 2 (two) times daily., Disp: , Rfl:  .  Vitamin D, Ergocalciferol, (DRISDOL) 50000 UNITS CAPS capsule, Take 50,000 Units by mouth every 7 (seven) days. Reported on 03/10/2015, Disp: , Rfl:    Review of Systems  Constitutional: Positive for  diaphoresis. Negative for fever, chills, appetite change, fatigue and unexpected weight change.  HENT: Negative for congestion, dental problem, drooling, ear pain, facial swelling, hearing loss, mouth sores, sneezing, sore throat, trouble swallowing and voice change.   Eyes: Positive for discharge, itching and visual disturbance. Negative for pain and redness.  Respiratory: Negative for cough, choking, shortness of breath and wheezing.   Cardiovascular: Negative for chest pain, palpitations and leg swelling.  Gastrointestinal: Positive for constipation. Negative for vomiting, abdominal pain, diarrhea and blood in stool.  Endocrine: Negative for cold intolerance, heat intolerance and polydipsia.  Genitourinary: Negative for dysuria, hematuria and decreased urine volume.  Musculoskeletal: Negative for back pain, arthralgias and gait problem.  Skin: Negative for rash.  Allergic/Immunologic: Negative for environmental allergies.  Neurological: Negative for seizures, syncope, light-headedness and headaches.  Hematological: Negative for adenopathy.  Psychiatric/Behavioral: Positive for dysphoric mood. Negative for suicidal ideas and agitation. The patient is nervous/anxious.     Per HPI unless specifically indicated above     Objective:    BP 136/70 mmHg  Pulse 82  Temp(Src) 97.3 F (36.3 C)  Ht 5\' 1"  (1.549 m)  Wt 167 lb 6.4 oz (75.932 kg)  BMI 31.65 kg/m2  SpO2 98%  Wt Readings from Last 3 Encounters:  03/10/15 167 lb 6.4 oz (75.932 kg)  12/08/14 173 lb 9.6  oz (78.744 kg)  09/22/14 174 lb (78.926 kg)    Physical Exam  Constitutional: She is oriented to person, place, and time. She appears well-developed and well-nourished.  HENT:  Head: Normocephalic and atraumatic.  Pulmonary/Chest: Effort normal.  Neurological: She is alert and oriented to person, place, and time.  Skin: Skin is warm and dry.  Psychiatric: She has a normal mood and affect. Her behavior is normal.  Vitals  reviewed.       Assessment & Plan:   Encounter Diagnoses  Name Primary?  . Type 2 diabetes mellitus with other ophthalmic complication, unspecified long term insulin use status ([place]) Yes  . Hyperlipidemia   . Type 2 diabetes mellitus with diabetic neuropathy, unspecified long term insulin use status ([place])   . Essential hypertension, benign   . Obesity, unspecified   . Gastroesophageal reflux disease, esophagitis presence not specified     -Discussed with pt that she can no longer be pt here since she has insurance.  She states understanding -Pt counseled to get new PCP soon as she is due for labs and she needs continuing care for her dm.   -Also urged pt to get to eye dr as she has eye conditions that she hasn't yet had treated.

## 2015-04-02 ENCOUNTER — Other Ambulatory Visit: Payer: Self-pay | Admitting: Physician Assistant

## 2015-04-10 ENCOUNTER — Other Ambulatory Visit: Payer: Self-pay | Admitting: Physician Assistant

## 2016-02-26 ENCOUNTER — Emergency Department (HOSPITAL_COMMUNITY)
Admission: EM | Admit: 2016-02-26 | Discharge: 2016-02-26 | Disposition: A | Discharge status: Home / Self Care | Payer: Managed Care, Other (non HMO) | Attending: Dermatology | Admitting: Dermatology

## 2016-02-26 ENCOUNTER — Encounter (HOSPITAL_COMMUNITY): Payer: Self-pay | Admitting: Emergency Medicine

## 2016-02-26 DIAGNOSIS — Z79899 Other long term (current) drug therapy: Secondary | ICD-10-CM | POA: Insufficient documentation

## 2016-02-26 DIAGNOSIS — T520X1A Toxic effect of petroleum products, accidental (unintentional), initial encounter: Secondary | ICD-10-CM | POA: Insufficient documentation

## 2016-02-26 DIAGNOSIS — Z7984 Long term (current) use of oral hypoglycemic drugs: Secondary | ICD-10-CM | POA: Diagnosis not present

## 2016-02-26 DIAGNOSIS — Z5321 Procedure and treatment not carried out due to patient leaving prior to being seen by health care provider: Secondary | ICD-10-CM | POA: Diagnosis not present

## 2016-02-26 DIAGNOSIS — E119 Type 2 diabetes mellitus without complications: Secondary | ICD-10-CM | POA: Diagnosis not present

## 2016-02-26 NOTE — ED Triage Notes (Signed)
Ingested 1 gulp of mineral spirits at 1230 today.  C/o abd burning and belching.

## 2016-02-26 NOTE — ED Notes (Signed)
Contacted poison control. Poison control states only care needed is symptomatic care. 6 hours post ingestion observe pt for severe sob, wheezing or coughing,Pt denies any of these s/s.    Poison control pt can be observed at home and does not have to stay at hospital and to call them back for any questions.

## 2016-03-03 ENCOUNTER — Ambulatory Visit: Payer: Self-pay | Admitting: Orthopaedic Surgery

## 2016-03-09 ENCOUNTER — Encounter: Payer: Self-pay | Admitting: Orthopaedic Surgery

## 2016-03-09 ENCOUNTER — Ambulatory Visit (INDEPENDENT_AMBULATORY_CARE_PROVIDER_SITE_OTHER): Payer: Managed Care, Other (non HMO)

## 2016-03-09 ENCOUNTER — Ambulatory Visit (INDEPENDENT_AMBULATORY_CARE_PROVIDER_SITE_OTHER): Payer: Managed Care, Other (non HMO) | Admitting: Orthopaedic Surgery

## 2016-03-09 VITALS — BP 119/73 | HR 76 | Temp 97.9°F | Ht 61.0 in | Wt 166.0 lb

## 2016-03-09 DIAGNOSIS — M25512 Pain in left shoulder: Secondary | ICD-10-CM

## 2016-03-09 MED ORDER — HYDROCODONE-ACETAMINOPHEN 5-325 MG PO TABS: ORAL_TABLET | ORAL | 0 refills | Status: DC

## 2016-03-09 NOTE — Progress Notes (Signed)
Subjective:    Patient ID: Amanda Zamora, female    DOB: [birth year], 56 y.o.   MRN: [id number]  HPI  She has had increasing pain of the left shoulder over the last six weeks.  She has no trauma, no paresthesias, no redness.  She has more pain with overhead use.  It awakens her at night when she rolls over on it.  She has tried Advil, Tylenol and heat and ice with only slight relief.     Review of Systems  HENT: Negative for congestion.   Respiratory: Negative for cough and shortness of breath.   Cardiovascular: Negative for chest pain and leg swelling.  Endocrine: Positive for cold intolerance.  Musculoskeletal: Positive for arthralgias.  Allergic/Immunologic: Positive for environmental allergies.   Past Medical History:  Diagnosis Date  . GERD (gastroesophageal reflux disease)   . History of pyelonephritis   . Type 2 diabetes mellitus ([place])     Past Surgical History:  Procedure Laterality Date  . TUBAL LIGATION      Current Outpatient Prescriptions on File Prior to Visit  Medication Sig Dispense Refill  . gabapentin (NEURONTIN) 300 MG capsule Take 1 capsule (300 mg total) by mouth 2 (two) times daily. Tome una tableta por boca dos veces diarias 60 capsule 1  . insulin NPH-insulin regular (NOVOLIN 70/30) (70-30) 100 UNIT/ML injection Inject 49 Units into the skin 2 (two) times daily.     Marland Kitchen losartan (COZAAR) 50 MG tablet TAKE 1 TABLET BY MOUTH EVERY DAY FOR BLOOD PRESSURE 30 tablet 0  . lovastatin (MEVACOR) 20 MG tablet Take 20 mg by mouth at bedtime.    . metFORMIN (GLUCOPHAGE) 1000 MG tablet Take 1 tablet (1,000 mg total) by mouth 2 (two) times daily with a meal. Tome una tableta por boca dos veces diarias con comida 60 tablet 2  . Omega-3 Fatty Acids (FISH OIL PO) Take 2 tablets by mouth daily.    . ranitidine (ZANTAC) 150 MG capsule Take 150 mg by mouth 2 (two) times daily.    . Vitamin D, Ergocalciferol, (DRISDOL) 50000 UNITS CAPS capsule Take 50,000 Units by mouth  every 7 (seven) days. Reported on 03/10/2015     No current facility-administered medications on file prior to visit.     Social History   Social History  . Marital status: Single    Spouse name: N/A  . Number of children: N/A  . Years of education: N/A   Occupational History  . Not on file.   Social History Main Topics  . Smoking status: Never Smoker  . Smokeless tobacco: Never Used  . Alcohol use No  . Drug use: No  . Sexual activity: Yes    Birth control/ protection: None   Other Topics Concern  . Not on file   Social History Narrative  . No narrative on file    Family History  Problem Relation Age of Onset  . Diabetes Mellitus II Mother   . Diabetes Mother   . Hypertension Mother   . Diabetes Father   . Hypertension Father   . Diabetes Sister   . Breast cancer Sister     BP 119/73   Pulse 76   Temp 97.9 F (36.6 C)   Ht 5\' 1"  (1.549 m)   Wt 166 lb (75.3 kg)   BMI 31.37 kg/m      Objective:   Physical Exam  Constitutional: She is oriented to person, place, and time. She appears well-developed and well-nourished.  HENT:  Head: Normocephalic and atraumatic.  Eyes: Conjunctivae and EOM are normal. Pupils are equal, round, and reactive to light.  Neck: Normal range of motion. Neck supple.  Cardiovascular: Normal rate, regular rhythm and intact distal pulses.   Pulmonary/Chest: Effort normal.  Abdominal: Soft.  Musculoskeletal: She exhibits tenderness (Pain left shoulder, forward flexion 165, abduction 120, internal 35, external 35, extension 15, adduction full.  NV intact. Grips normal.  Neck motion normal.  Right shoudler negative.).  Neurological: She is alert and oriented to person, place, and time. She displays normal reflexes. No cranial nerve deficit. She exhibits normal muscle tone. Coordination normal.  Skin: Skin is warm and dry.  Psychiatric: She has a normal mood and affect. Her behavior is normal. Judgment and thought content normal.     X-rays were done of the left shoulder, reported separately.      Assessment & Plan:   Encounter Diagnosis  Name Primary?  . Pain in joint of left shoulder Yes   I feel she has bursitis of the left shoulder.  PROCEDURE NOTE:  The patient request injection, verbal consent was obtained.  The left shoulder was prepped appropriately after time out was performed.   Sterile technique was observed and injection of 1 cc of Depo-Medrol 40 mg with several cc's of plain xylocaine. Anesthesia was provided by ethyl chloride and a 20-gauge needle was used to inject the shoulder area. A posterior approach was used.  The injection was tolerated well.  A band aid dressing was applied.  The patient was advised to apply ice later today and tomorrow to the injection sight as needed.  I have given pain medicine, five day limit, after first reviewing the state narcotic web site.  Return in two weeks.  Precautions discussed.  Call if any problem. Electronically [place], MD 1/24/20184:26 PM

## 2016-03-23 ENCOUNTER — Ambulatory Visit (INDEPENDENT_AMBULATORY_CARE_PROVIDER_SITE_OTHER): Payer: Managed Care, Other (non HMO) | Admitting: Orthopaedic Surgery

## 2016-03-23 ENCOUNTER — Encounter: Payer: Self-pay | Admitting: Orthopaedic Surgery

## 2016-03-23 VITALS — BP 129/76 | HR 68 | Temp 97.7°F | Ht 61.0 in | Wt 168.0 lb

## 2016-03-23 DIAGNOSIS — M25512 Pain in left shoulder: Secondary | ICD-10-CM

## 2016-03-23 MED ORDER — HYDROCODONE-ACETAMINOPHEN 5-325 MG PO TABS: 1.0000 | ORAL_TABLET | ORAL | 0 refills | Status: DC | PRN

## 2016-03-23 NOTE — Progress Notes (Signed)
Patient TA:[id number] Amanda Zamora, female DOB:1960/02/16, 56 y.o. Amanda Zamora:[id number]  Chief Complaint  Patient presents with  . Follow-up    LEFT SHOULDER PAIN    HPI  [name] is a 56 y.o. female who has left shoulder pain.  She is better but still has pain with overhead use.  She has no new trauma. HPI  Body mass index is 31.74 kg/m.  ROS  Review of Systems  HENT: Negative for congestion.   Respiratory: Negative for cough and shortness of breath.   Cardiovascular: Negative for chest pain and leg swelling.  Endocrine: Positive for cold intolerance.  Musculoskeletal: Positive for arthralgias.  Allergic/Immunologic: Positive for environmental allergies.    Past Medical History:  Diagnosis Date  . GERD (gastroesophageal reflux disease)   . History of pyelonephritis   . Type 2 diabetes mellitus ([place])     Past Surgical History:  Procedure Laterality Date  . TUBAL LIGATION      Family History  Problem Relation Age of Onset  . Diabetes Mellitus II Mother   . Diabetes Mother   . Hypertension Mother   . Diabetes Father   . Hypertension Father   . Diabetes Sister   . Breast cancer Sister     Social History Social History  Substance Use Topics  . Smoking status: Never Smoker  . Smokeless tobacco: Never Used  . Alcohol use No    No Known Allergies  Current Outpatient Prescriptions  Medication Sig Dispense Refill  . gabapentin (NEURONTIN) 300 MG capsule Take 1 capsule (300 mg total) by mouth 2 (two) times daily. Tome una tableta por boca dos veces diarias 60 capsule 1  . HYDROcodone-acetaminophen (NORCO/VICODIN) 5-325 MG tablet Take 1 tablet by mouth every 4 (four) hours as needed for moderate pain (Must last 14 days.Do not take and drive a car or use machinery.). 56 tablet 0  . insulin NPH-insulin regular (NOVOLIN 70/30) (70-30) 100 UNIT/ML injection Inject 49 Units into the skin 2 (two) times daily.     Marland Kitchen losartan (COZAAR) 50 MG tablet TAKE 1 TABLET BY MOUTH EVERY  DAY FOR BLOOD PRESSURE 30 tablet 0  . lovastatin (MEVACOR) 20 MG tablet Take 20 mg by mouth at bedtime.    . metFORMIN (GLUCOPHAGE) 1000 MG tablet Take 1 tablet (1,000 mg total) by mouth 2 (two) times daily with a meal. Tome una tableta por boca dos veces diarias con comida 60 tablet 2  . Omega-3 Fatty Acids (FISH OIL PO) Take 2 tablets by mouth daily.    . ranitidine (ZANTAC) 150 MG capsule Take 150 mg by mouth 2 (two) times daily.    . Vitamin D, Ergocalciferol, (DRISDOL) 50000 UNITS CAPS capsule Take 50,000 Units by mouth every 7 (seven) days. Reported on 03/10/2015     No current facility-administered medications for this visit.      Physical Exam  Blood pressure 129/76, pulse 68, temperature 97.7 F (36.5 C), height 5\' 1"  (1.549 m), weight 168 lb (76.2 kg).  Constitutional: overall normal hygiene, normal nutrition, well developed, normal grooming, normal body habitus. Assistive device:none  Musculoskeletal: gait and station Limp none, muscle tone and strength are normal, no tremors or atrophy is present.  .  Neurological: coordination overall normal.  Deep tendon reflex/nerve stretch intact.  Sensation normal.  Cranial nerves II-XII intact.   Skin:   Normal overall no scars, lesions, ulcers or rashes. No psoriasis.  Psychiatric: Alert and oriented x 3.  Recent memory intact, remote memory unclear.  Normal mood and affect.  Well groomed.  Good eye contact.  Cardiovascular: overall no swelling, no varicosities, no edema bilaterally, normal temperatures of the legs and arms, no clubbing, cyanosis and good capillary refill.  Lymphatic: palpation is normal.  Examination of left Upper Extremity is done.  Inspection:   Overall:  Elbow non-tender without crepitus or defects, forearm non-tender without crepitus or defects, wrist non-tender without crepitus or defects, hand non-tender.    Shoulder: with glenohumeral joint tenderness, without effusion.   Upper arm: without swelling and  tenderness   Range of motion:   Overall:  Full range of motion of the elbow, full range of motion of wrist and full range of motion in fingers.   Shoulder:  left  165 degrees forward flexion; 165 degrees abduction; 35 degrees internal rotation, 35 degrees external rotation, 15 degrees extension, 40 degrees adduction.   Stability:   Overall:  Shoulder, elbow and wrist stable   Strength and Tone:   Overall full shoulder muscles strength, full upper arm strength and normal upper arm bulk and tone.   The patient has been educated about the nature of the problem(s) and counseled on treatment options.  The patient appeared to understand what I have discussed and is in agreement with it.  Encounter Diagnosis  Name Primary?  . Pain in joint of left shoulder Yes    PLAN Call if any problems.  Precautions discussed.  Continue current medications.   Return to clinic 1 month   I have reviewed the [place] web site prior to prescribing narcotic medicine for this patient.  Electronically Signed Sanjuana Kava, MD 2/7/20183:41 PM

## 2016-04-27 ENCOUNTER — Ambulatory Visit: Payer: Managed Care, Other (non HMO) | Admitting: Orthopaedic Surgery

## 2016-05-04 ENCOUNTER — Ambulatory Visit: Payer: Managed Care, Other (non HMO) | Admitting: Orthopaedic Surgery

## 2016-05-04 ENCOUNTER — Encounter: Payer: Self-pay | Admitting: Orthopaedic Surgery

## 2016-07-25 ENCOUNTER — Other Ambulatory Visit (HOSPITAL_COMMUNITY): Payer: Self-pay | Admitting: Family Medicine

## 2016-07-25 DIAGNOSIS — Z1231 Encounter for screening mammogram for malignant neoplasm of breast: Secondary | ICD-10-CM

## 2016-07-28 ENCOUNTER — Ambulatory Visit (HOSPITAL_COMMUNITY)
Admission: RE | Admit: 2016-07-28 | Discharge: 2016-07-28 | Disposition: A | Discharge status: Home / Self Care | Payer: Managed Care, Other (non HMO) | Source: Ambulatory Visit | Attending: Family Medicine | Admitting: Family Medicine

## 2016-07-28 DIAGNOSIS — Z1231 Encounter for screening mammogram for malignant neoplasm of breast: Secondary | ICD-10-CM | POA: Diagnosis not present

## 2017-02-28 ENCOUNTER — Other Ambulatory Visit: Payer: Self-pay

## 2017-02-28 ENCOUNTER — Encounter (HOSPITAL_COMMUNITY): Payer: Self-pay

## 2017-02-28 ENCOUNTER — Emergency Department (HOSPITAL_COMMUNITY)
Admission: EM | Admit: 2017-02-28 | Discharge: 2017-03-01 | Disposition: A | Discharge status: Home / Self Care | Payer: Managed Care, Other (non HMO) | Attending: Emergency Medicine | Admitting: Emergency Medicine

## 2017-02-28 DIAGNOSIS — E119 Type 2 diabetes mellitus without complications: Secondary | ICD-10-CM | POA: Insufficient documentation

## 2017-02-28 DIAGNOSIS — Z79899 Other long term (current) drug therapy: Secondary | ICD-10-CM | POA: Insufficient documentation

## 2017-02-28 DIAGNOSIS — K5901 Slow transit constipation: Secondary | ICD-10-CM

## 2017-02-28 DIAGNOSIS — R110 Nausea: Secondary | ICD-10-CM | POA: Insufficient documentation

## 2017-02-28 DIAGNOSIS — I10 Essential (primary) hypertension: Secondary | ICD-10-CM | POA: Diagnosis not present

## 2017-02-28 DIAGNOSIS — R1084 Generalized abdominal pain: Secondary | ICD-10-CM | POA: Diagnosis not present

## 2017-02-28 DIAGNOSIS — Z7984 Long term (current) use of oral hypoglycemic drugs: Secondary | ICD-10-CM | POA: Diagnosis not present

## 2017-02-28 DIAGNOSIS — K5900 Constipation, unspecified: Secondary | ICD-10-CM | POA: Diagnosis present

## 2017-02-28 NOTE — ED Triage Notes (Signed)
Pt reports abdominal pain that started about one week ago. She reports seeing her PCP and being prescribed Linzess, daily probiotic,and Ibuprofen. Reports small, hard BM today and yesterday, but prior to that had not had a BM for several days. Pt reports feeling like she is not having a large enough BM and feels like she is still constipated.

## 2017-03-01 ENCOUNTER — Emergency Department (HOSPITAL_COMMUNITY): Payer: Managed Care, Other (non HMO)

## 2017-03-01 LAB — COMPREHENSIVE METABOLIC PANEL
ALBUMIN: 4.2 g/dL (ref 3.5–5.0)
ALK PHOS: 74 U/L (ref 38–126)
ALT: 11 U/L — ABNORMAL LOW (ref 14–54)
ANION GAP: 12 (ref 5–15)
AST: 18 U/L (ref 15–41)
BILIRUBIN TOTAL: 0.6 mg/dL (ref 0.3–1.2)
BUN: 28 mg/dL — ABNORMAL HIGH (ref 6–20)
CALCIUM: 10 mg/dL (ref 8.9–10.3)
CO2: 26 mmol/L (ref 22–32)
CREATININE: 1.05 mg/dL — AB (ref 0.44–1.00)
Chloride: 101 mmol/L (ref 101–111)
GFR calc non Af Amer: 59 mL/min — ABNORMAL LOW (ref >60)
GLUCOSE: 73 mg/dL (ref 65–99)
Potassium: 5 mmol/L (ref 3.5–5.1)
Sodium: 139 mmol/L (ref 135–145)
Total Protein: 8.4 g/dL — ABNORMAL HIGH (ref 6.5–8.1)

## 2017-03-01 LAB — CBC WITH DIFFERENTIAL/PLATELET
Basophils Absolute: 0 10*3/uL (ref 0.0–0.1)
Basophils Relative: 0 %
Eosinophils Absolute: 0.1 10*3/uL (ref 0.0–0.7)
Eosinophils Relative: 2 %
HEMATOCRIT: 38.1 % (ref 36.0–46.0)
HEMOGLOBIN: 12.3 g/dL (ref 12.0–15.0)
LYMPHS ABS: 3.1 10*3/uL (ref 0.7–4.0)
Lymphocytes Relative: 36 %
MCH: 28.5 pg (ref 26.0–34.0)
MCHC: 32.3 g/dL (ref 30.0–36.0)
MCV: 88.4 fL (ref 78.0–100.0)
MONOS PCT: 6 %
Monocytes Absolute: 0.6 10*3/uL (ref 0.1–1.0)
NEUTROS ABS: 4.9 10*3/uL (ref 1.7–7.7)
NEUTROS PCT: 56 %
Platelets: 296 10*3/uL (ref 150–400)
RBC: 4.31 MIL/uL (ref 3.87–5.11)
RDW: 14 % (ref 11.5–15.5)
WBC: 8.8 10*3/uL (ref 4.0–10.5)

## 2017-03-01 LAB — LIPASE, BLOOD: Lipase: 44 U/L (ref 11–51)

## 2017-03-01 MED ORDER — HYDROMORPHONE HCL 1 MG/ML IJ SOLN
1.0000 mg | Freq: Once | INTRAMUSCULAR | Status: AC
  Administered 2017-03-01: 1 mg via INTRAVENOUS
  Filled 2017-03-01: qty 1

## 2017-03-01 MED ORDER — IOPAMIDOL (ISOVUE-300) INJECTION 61%
100.0000 mL | Freq: Once | INTRAVENOUS | Status: AC | PRN
  Administered 2017-03-01: 100 mL via INTRAVENOUS

## 2017-03-01 MED ORDER — SODIUM CHLORIDE 0.9 % IV SOLN
Freq: Once | INTRAVENOUS | Status: AC
  Administered 2017-03-01: 01:00:00 via INTRAVENOUS

## 2017-03-01 MED ORDER — ONDANSETRON HCL 4 MG/2ML IJ SOLN
4.0000 mg | Freq: Once | INTRAMUSCULAR | Status: AC
Start: 2017-03-01 — End: 2017-03-01
  Administered 2017-03-01: 4 mg via INTRAVENOUS
  Filled 2017-03-01: qty 2

## 2017-03-01 MED ORDER — MAGNESIUM CITRATE PO SOLN: 1.0000 | Freq: Once | ORAL | 0 refills | Status: AC

## 2017-03-01 MED ORDER — LACTULOSE 10 GM/15ML PO SOLN: 10.0000 g | Freq: Two times a day (BID) | ORAL | 0 refills | Status: DC | PRN

## 2017-03-01 MED ORDER — SODIUM CHLORIDE 0.9 % IV BOLUS (SEPSIS)
500.0000 mL | Freq: Once | INTRAVENOUS | Status: AC
  Administered 2017-03-01: 500 mL via INTRAVENOUS

## 2017-03-01 NOTE — ED Provider Notes (Signed)
[facility] EMERGENCY DEPARTMENT Provider Note   CSN: [id number] Arrival date & time: 02/28/17  2307     History   Chief Complaint Chief Complaint  Patient presents with  . Constipation    HPI Amanda Zamora is a 57 y.o. female.  Patient has been experiencing abdominal pain for more than a week.  The patient reports that symptoms have been progressively worsening.  She saw her doctor and it was felt that it was likely secondary to constipation.  She has not been having normal bowel movements.  She was prescribed Linzess and put on a probiotic.  She reports that she has had several small bowel movements in the last couple of days, but not much.  Her abdomen is more distended and she is experiencing increasing pain.  Pain is sharp, crampy and diffuse.      Past Medical History:  Diagnosis Date  . GERD (gastroesophageal reflux disease)   . History of pyelonephritis   . Type 2 diabetes mellitus [facility])     Patient Active Problem List   Diagnosis Date Noted  . Hyperlipidemia 03/10/2015  . Essential hypertension, benign 03/10/2015  . Obesity, unspecified 03/10/2015  . Esophageal reflux 03/10/2015  . Precordial pain 03/28/2013  . Type 2 diabetes mellitus ([place]) 03/28/2013    Past Surgical History:  Procedure Laterality Date  . TUBAL LIGATION      OB History    No data available       Home Medications    Prior to Admission medications   Medication Sig Start Date End Date Taking? Authorizing Provider  Dulaglutide (TRULICITY [place]) Inject into the skin once a week.   Yes [provider]  insulin degludec (TRESIBA) 100 UNIT/ML SOPN FlexTouch Pen Inject 50 Units into the skin 1 day or 1 dose.   Yes [provider]  insulin NPH-insulin regular (NOVOLIN 70/30) (70-30) 100 UNIT/ML injection Inject 49 Units into the skin 2 (two) times daily.    Yes [provider]  losartan (COZAAR) 50 MG tablet TAKE 1 TABLET BY MOUTH EVERY DAY FOR BLOOD PRESSURE  12/08/14  Yes Soyla Dryer, PA-C  metFORMIN (GLUCOPHAGE) 1000 MG tablet Take 1 tablet (1,000 mg total) by mouth 2 (two) times daily with a meal. Tome una tableta por boca dos veces diarias con comida 01/06/15  Yes Soyla Dryer, PA-C  gabapentin (NEURONTIN) 300 MG capsule Take 1 capsule (300 mg total) by mouth 2 (two) times daily. Tome una tableta por boca dos veces diarias 01/06/15   Soyla Dryer, PA-C  HYDROcodone-acetaminophen (NORCO/VICODIN) 5-325 MG tablet Take 1 tablet by mouth every 4 (four) hours as needed for moderate pain (Must last 14 days.Do not take and drive a car or use machinery.). 03/23/16   Sanjuana Kava, MD  lactulose (CHRONULAC) 10 GM/15ML solution Take 15 mLs (10 g total) by mouth 2 (two) times daily as needed for moderate constipation or severe constipation. 03/01/17   Orpah Greek, MD  lovastatin (MEVACOR) 20 MG tablet Take 20 mg by mouth at bedtime.    [provider]  magnesium citrate SOLN Take 296 mLs (1 Bottle total) by mouth once for 1 dose. 03/01/17 03/01/17  Orpah Greek, MD  Omega-3 Fatty Acids (FISH OIL PO) Take 2 tablets by mouth daily.    [provider]  ranitidine (ZANTAC) 150 MG capsule Take 150 mg by mouth 2 (two) times daily.    [provider]  Vitamin D, Ergocalciferol, (DRISDOL) 50000 UNITS CAPS capsule Take 50,000 Units  by mouth every 7 (seven) days. Reported on 03/10/2015    [provider]    Family History Family History  Problem Relation Age of Onset  . Diabetes Mellitus II Mother   . Diabetes Mother   . Hypertension Mother   . Diabetes Father   . Hypertension Father   . Diabetes Sister   . Breast cancer Sister     Social History Social History   Tobacco Use  . Smoking status: Never Smoker  . Smokeless tobacco: Never Used  Substance Use Topics  . Alcohol use: No  . Drug use: No     Allergies   Patient has no known allergies.   Review of Systems Review of Systems    Constitutional: Negative for fever.  Gastrointestinal: Positive for abdominal distention, abdominal pain, constipation and nausea. Negative for vomiting.  All other systems reviewed and are negative.    Physical Exam Updated Vital Signs BP 128/69   Pulse 66   Temp 98.6 F (37 C) (Oral)   Resp 20   SpO2 98%   Physical Exam  Constitutional: She is oriented to person, place, and time. She appears well-developed and well-nourished. No distress.  HENT:  Head: Normocephalic and atraumatic.  Right Ear: Hearing normal.  Left Ear: Hearing normal.  Nose: Nose normal.  Mouth/Throat: Oropharynx is clear and moist and mucous membranes are normal.  Eyes: Conjunctivae and EOM are normal. Pupils are equal, round, and reactive to light.  Neck: Normal range of motion. Neck supple.  Cardiovascular: Regular rhythm, S1 normal and S2 normal. Exam reveals no gallop and no friction rub.  No murmur heard. Pulmonary/Chest: Effort normal and breath sounds normal. No respiratory distress. She exhibits no tenderness.  Abdominal: Soft. Normal appearance and bowel sounds are normal. She exhibits distension. There is no hepatosplenomegaly. There is tenderness (diffuse). There is no rebound, no guarding, no tenderness at McBurney's point and negative Murphy's sign. No hernia.  Musculoskeletal: Normal range of motion.  Neurological: She is alert and oriented to person, place, and time. She has normal strength. No cranial nerve deficit or sensory deficit. Coordination normal. GCS eye subscore is 4. GCS verbal subscore is 5. GCS motor subscore is 6.  Skin: Skin is warm, dry and intact. No rash noted. No cyanosis.  Psychiatric: She has a normal mood and affect. Her speech is normal and behavior is normal. Thought content normal.  Nursing note and vitals reviewed.    ED Treatments / Results  Labs (all labs ordered are listed, but only abnormal results are displayed) Labs Reviewed  COMPREHENSIVE METABOLIC PANEL  - Abnormal; Notable for the following components:      Result Value   BUN 28 (*)    Creatinine, Ser 1.05 (*)    Total Protein 8.4 (*)    ALT 11 (*)    GFR calc non Af Amer 59 (*)    All other components within normal limits  CBC WITH DIFFERENTIAL/PLATELET  LIPASE, BLOOD    EKG  EKG Interpretation None       Radiology Ct Abdomen Pelvis W Contrast  Result Date: 03/01/2017 CLINICAL DATA:  57 year old female with abdominal distention EXAM: CT ABDOMEN AND PELVIS WITH CONTRAST TECHNIQUE: Multidetector CT imaging of the abdomen and pelvis was performed using the standard protocol following bolus administration of intravenous contrast. CONTRAST:  123mL ISOVUE-300 IOPAMIDOL (ISOVUE-300) INJECTION 61% COMPARISON:  Abdominal CT dated 05/17/2013 FINDINGS: Lower chest: Minimal bibasilar dependent atelectatic changes. The visualized lung bases are otherwise clear. No intra-abdominal  free air or free fluid. Hepatobiliary: No focal liver abnormality is seen. No gallstones, gallbladder wall thickening, or biliary dilatation. Pancreas: Unremarkable. No pancreatic ductal dilatation or surrounding inflammatory changes. Spleen: Normal in size without focal abnormality. Adrenals/Urinary Tract: The adrenal glands are unremarkable. There is a 12 mm cyst in the inferior pole of the right kidney. The left kidney is unremarkable. There is symmetric enhancement and excretion of contrast by both kidneys. There is no hydronephrosis. The visualized ureters and urinary bladder appear unremarkable. Stomach/Bowel: There is no bowel obstruction or active inflammation. Normal caliber fecalized loops of distal small bowel may represent increased transit time or small intestine bacterial overgrowth. Clinical correlation is recommended. Mild thickened appearance of the greater curvature of the stomach likely related to underdistention the appendix is normal. Vascular/Lymphatic: Mild aortoiliac atherosclerotic disease. The abdominal  aorta and IVC are otherwise unremarkable. No portal venous gas. There is no adenopathy. Reproductive: The uterus is anteverted and grossly unremarkable. The ovaries are grossly unremarkable as visualized. No pelvic mass. Other: Small fat containing umbilical hernia.  No fluid collection. Musculoskeletal: Mild degenerative changes of the spine. No acute osseous pathology. Grade 1 L4-L5 anterolisthesis. IMPRESSION: No acute intra-abdominal or pelvic pathology. No bowel obstruction or active inflammation. Normal appendix. Mild Aortic Atherosclerosis (ICD10-I70.0). Electronically Signed   By: Anner Crete M.D.   On: 03/01/2017 01:51    Procedures Procedures (including critical care time)  Medications Ordered in ED Medications  sodium chloride 0.9 % bolus 500 mL (0 mLs Intravenous Stopped 03/01/17 0117)    Followed by  0.9 %  sodium chloride infusion ( Intravenous New Bag/Given 03/01/17 0118)  HYDROmorphone (DILAUDID) injection 1 mg (1 mg Intravenous Given 03/01/17 0039)  ondansetron (ZOFRAN) injection 4 mg (4 mg Intravenous Given 03/01/17 0038)  iopamidol (ISOVUE-300) 61 % injection 100 mL (100 mLs Intravenous Contrast Given 03/01/17 0122)     Initial Impression / Assessment and Plan / ED Course  I have reviewed the triage vital signs and the nursing notes.  Pertinent labs & imaging results that were available during my care of the patient were reviewed by me and considered in my medical decision making (see chart for details).     Presents to the emergency department with complaints of abdominal pain.  She has not been having normal bowel movements for approximately a week.  She was placed on Linzess and probiotic by her primary care physician, but symptoms are worsening.  She is experiencing increased abdominal distention and pain.  Workup has been entirely unremarkable.  This included a CAT scan.  Reviewing the CAT scan does reveal increased stool burden consistent with constipation, however,  no sign of any other acute pathology.  Will treat with magnesium citrate and lactulose, follow-up with PCP.  Final Clinical Impressions(s) / ED Diagnoses   Final diagnoses:  Generalized abdominal pain  Slow transit constipation    ED Discharge Orders        Ordered    magnesium citrate SOLN   Once     03/01/17 0200    lactulose (CHRONULAC) 10 GM/15ML solution  2 times daily PRN     03/01/17 0200       Orpah Greek, MD 03/01/17 0200

## 2017-03-01 NOTE — ED Notes (Signed)
Patient returned from CT

## 2017-03-01 NOTE — ED Notes (Signed)
Patient transported to CT 

## 2018-04-11 ENCOUNTER — Encounter (HOSPITAL_COMMUNITY): Payer: Self-pay

## 2018-04-11 ENCOUNTER — Other Ambulatory Visit: Payer: Self-pay

## 2018-04-11 ENCOUNTER — Emergency Department (HOSPITAL_COMMUNITY): Payer: 59

## 2018-04-11 ENCOUNTER — Emergency Department (HOSPITAL_COMMUNITY)
Admission: EM | Admit: 2018-04-11 | Discharge: 2018-04-11 | Disposition: A | Discharge status: Home / Self Care | Payer: 59 | Attending: Emergency Medicine | Admitting: Emergency Medicine

## 2018-04-11 DIAGNOSIS — E119 Type 2 diabetes mellitus without complications: Secondary | ICD-10-CM | POA: Diagnosis not present

## 2018-04-11 DIAGNOSIS — R079 Chest pain, unspecified: Secondary | ICD-10-CM

## 2018-04-11 DIAGNOSIS — Z79899 Other long term (current) drug therapy: Secondary | ICD-10-CM | POA: Diagnosis not present

## 2018-04-11 DIAGNOSIS — M549 Dorsalgia, unspecified: Secondary | ICD-10-CM | POA: Insufficient documentation

## 2018-04-11 DIAGNOSIS — I10 Essential (primary) hypertension: Secondary | ICD-10-CM | POA: Insufficient documentation

## 2018-04-11 DIAGNOSIS — R0789 Other chest pain: Secondary | ICD-10-CM | POA: Insufficient documentation

## 2018-04-11 LAB — HEPATIC FUNCTION PANEL
ALT: 17 U/L (ref 0–44)
AST: 17 U/L (ref 15–41)
Albumin: 3.9 g/dL (ref 3.5–5.0)
Alkaline Phosphatase: 72 U/L (ref 38–126)
Bilirubin, Direct: 0.1 mg/dL (ref 0.0–0.2)
Indirect Bilirubin: 0.2 mg/dL — ABNORMAL LOW (ref 0.3–0.9)
TOTAL PROTEIN: 7.1 g/dL (ref 6.5–8.1)
Total Bilirubin: 0.3 mg/dL (ref 0.3–1.2)

## 2018-04-11 LAB — BASIC METABOLIC PANEL
Anion gap: 8 (ref 5–15)
BUN: 14 mg/dL (ref 6–20)
CO2: 26 mmol/L (ref 22–32)
Calcium: 9.3 mg/dL (ref 8.9–10.3)
Chloride: 105 mmol/L (ref 98–111)
Creatinine, Ser: 0.77 mg/dL (ref 0.44–1.00)
GFR calc Af Amer: >60 mL/min (ref >60)
GLUCOSE: 137 mg/dL — AB (ref 70–99)
POTASSIUM: 4.3 mmol/L (ref 3.5–5.1)
SODIUM: 139 mmol/L (ref 135–145)

## 2018-04-11 LAB — CBC
HEMATOCRIT: 36.8 % (ref 36.0–46.0)
HEMOGLOBIN: 11.7 g/dL — AB (ref 12.0–15.0)
MCH: 29.4 pg (ref 26.0–34.0)
MCHC: 31.8 g/dL (ref 30.0–36.0)
MCV: 92.5 fL (ref 80.0–100.0)
Platelets: 297 10*3/uL (ref 150–400)
RBC: 3.98 MIL/uL (ref 3.87–5.11)
RDW: 13.9 % (ref 11.5–15.5)
WBC: 5.5 10*3/uL (ref 4.0–10.5)
nRBC: 0 % (ref 0.0–0.2)

## 2018-04-11 LAB — TROPONIN I: Troponin I: <0.03 ng/mL (ref <0.03)

## 2018-04-11 LAB — URINALYSIS, ROUTINE W REFLEX MICROSCOPIC
Bilirubin Urine: NEGATIVE
GLUCOSE, UA: NEGATIVE mg/dL
Hgb urine dipstick: NEGATIVE
Ketones, ur: NEGATIVE mg/dL
LEUKOCYTE UA: NEGATIVE
Nitrite: NEGATIVE
Protein, ur: NEGATIVE mg/dL
Specific Gravity, Urine: 1.018 (ref 1.005–1.030)
pH: 8 (ref 5.0–8.0)

## 2018-04-11 MED ORDER — HYDROMORPHONE HCL 1 MG/ML IJ SOLN
0.5000 mg | Freq: Once | INTRAMUSCULAR | Status: AC
  Administered 2018-04-11: 0.5 mg via INTRAVENOUS
  Filled 2018-04-11: qty 1

## 2018-04-11 MED ORDER — IOPAMIDOL (ISOVUE-370) INJECTION 76%
150.0000 mL | Freq: Once | INTRAVENOUS | Status: AC | PRN
  Administered 2018-04-11: 100 mL via INTRAVENOUS

## 2018-04-11 MED ORDER — MELOXICAM 7.5 MG PO TABS: 7.5000 mg | ORAL_TABLET | Freq: Every day | ORAL | 0 refills | Status: DC

## 2018-04-11 MED ORDER — SODIUM CHLORIDE 0.9% FLUSH: 3.0000 mL | Freq: Once | INTRAVENOUS | Status: DC

## 2018-04-11 MED ORDER — KETOROLAC TROMETHAMINE 30 MG/ML IJ SOLN
15.0000 mg | Freq: Once | INTRAMUSCULAR | Status: AC
  Administered 2018-04-11: 15 mg via INTRAVENOUS
  Filled 2018-04-11: qty 1

## 2018-04-11 NOTE — ED Triage Notes (Signed)
Pt with complaints of chest pain on right side which started last night. Pt states it hurts more with sudden movement. Pt denies cough. Pt states she was just laying down when pain started. Pt also complaining of diarrhea. Daughter states she had back pain on the right side 3 days ago and now it is more in her chest.

## 2018-04-11 NOTE — ED Notes (Signed)
Patient transported to CT 

## 2018-04-11 NOTE — ED Notes (Signed)
Pt aware a urine specimen is needed. Will notify staff when one can be obtained. 

## 2018-04-11 NOTE — ED Provider Notes (Signed)
Emergency Department Provider Note   I have reviewed the triage vital signs and the nursing notes.   HISTORY  Chief Complaint Chest Pain   HPI Amanda Zamora is a 58 y.o. female with history of diabetes, pyelonephritis and reflux who presents the emergency department today with chest pain.  Patient states on the right side it is worse with movement with deep breaths.  She is never had pain like this before.  Patient states she has had no cough or recent trauma.  No fevers.  No associated shortness of breath except for when the pain is really bad.  She also gets nauseous when the pain is really bad.  No rashes.  Has had a couple episodes of diarrhea but not persistent.  No abdominal pain.  States the pain did seem to start in the back and radiated around. No other associated or modifying symptoms.     Past Medical History:  Diagnosis Date  . GERD (gastroesophageal reflux disease)   . History of pyelonephritis   . Type 2 diabetes mellitus [facility])     Patient Active Problem List   Diagnosis Date Noted  . Hyperlipidemia 03/10/2015  . Essential hypertension, benign 03/10/2015  . Obesity, unspecified 03/10/2015  . Esophageal reflux 03/10/2015  . Precordial pain 03/28/2013  . Type 2 diabetes mellitus ([place]) 03/28/2013    Past Surgical History:  Procedure Laterality Date  . TUBAL LIGATION      Current Outpatient Rx  . Order #: [id number] Class: Historical Med  . Order #: [id number] Class: Normal  . Order #: [id number] Class: Historical Med  . Order #: [id number] Class: Historical Med  . Order #: [id number] Class: Normal  . Order #: [id number] Class: Historical Med  . Order #: [id number] Class: Normal  . Order #: [id number] Class: Print    Allergies Patient has no known allergies.  Family History  Problem Relation Age of Onset  . Diabetes Mellitus II Mother   . Diabetes Mother   . Hypertension Mother   . Diabetes Father   . Hypertension Father   . Diabetes Sister   . Breast  cancer Sister     Social History Social History   Tobacco Use  . Smoking status: Never Smoker  . Smokeless tobacco: Never Used  Substance Use Topics  . Alcohol use: No  . Drug use: No    Review of Systems  All other systems negative except as documented in the HPI. All pertinent positives and negatives as reviewed in the HPI. ____________________________________________   PHYSICAL EXAM:  VITAL SIGNS: ED Triage Vitals  Enc Vitals Group     BP 04/11/18 1105 (!) 132/104     Pulse Rate 04/11/18 1105 78     Resp 04/11/18 1105 20     Temp 04/11/18 1105 97.8 F (36.6 C)     Temp Source 04/11/18 1105 Oral     SpO2 04/11/18 1105 100 %     Weight 04/11/18 1106 158 lb (71.7 kg)     Height 04/11/18 1106 5' (1.524 m)    Constitutional: Alert and oriented. Well appearing and in no acute distress. Eyes: Conjunctivae are normal. PERRL. EOMI. Head: Atraumatic. Nose: No congestion/rhinnorhea. Mouth/Throat: Mucous membranes are moist.  Oropharynx non-erythematous. Neck: No stridor.  No meningeal signs.   Cardiovascular: Normal rate, regular rhythm. Good peripheral circulation. Grossly normal heart sounds.   Respiratory: Normal respiratory effort.  No retractions. Lungs CTAB. Gastrointestinal: Soft and nontender. No distention.  Musculoskeletal: No lower extremity tenderness nor edema. No gross deformities of extremities. Neurologic:  Normal speech and language. No gross focal neurologic deficits are appreciated.  Skin:  Skin is warm, dry and intact. No rash noted.   ____________________________________________   LABS (all labs ordered are listed, but only abnormal results are displayed)  Labs Reviewed  BASIC METABOLIC PANEL - Abnormal; Notable for the following components:      Result Value   Glucose, Bld 137 (*)    All other components within normal limits  CBC - Abnormal; Notable for the following components:   Hemoglobin 11.7 (*)    All other components within normal  limits  HEPATIC FUNCTION PANEL - Abnormal; Notable for the following components:   Indirect Bilirubin 0.2 (*)    All other components within normal limits  URINALYSIS, ROUTINE W REFLEX MICROSCOPIC - Abnormal; Notable for the following components:   Color, Urine STRAW (*)    APPearance HAZY (*)    All other components within normal limits  TROPONIN I  TROPONIN I   ____________________________________________  EKG   EKG Interpretation  Date/Time:  Wednesday April 11 2018 11:04:10 EST Ventricular Rate:  79 PR Interval:  118 QRS Duration: 72 QT Interval:  374 QTC Calculation: 428 R Axis:   102 Text Interpretation:  Normal sinus rhythm Rightward axis Low voltage QRS Borderline ECG No acute changes Confirmed by Merrily Pew [phone]) on 04/11/2018 12:21:37 PM       ____________________________________________  RADIOLOGY  Dg Chest 2 View  Result Date: 04/11/2018 CLINICAL DATA:  Right back and chest pain for 3 days. EXAM: CHEST - 2 VIEW COMPARISON:  PA and lateral chest 03/28/2013. FINDINGS: The lungs are clear. Heart size is normal. No pneumothorax or pleural fluid. No acute or focal bony abnormality. IMPRESSION: Negative chest. Electronically Signed   By: Inge Rise M.D.   On: 04/11/2018 11:41   Ct Angio Chest Pe W And/or Wo Contrast  Result Date: 04/11/2018 CLINICAL DATA:  Chest pain EXAM: CT ANGIOGRAPHY CHEST WITH CONTRAST TECHNIQUE: Multidetector CT imaging of the chest was performed using the standard protocol during bolus administration of intravenous contrast. Multiplanar CT image reconstructions and MIPs were obtained to evaluate the vascular anatomy. CONTRAST:  141mL ISOVUE-370 IOPAMIDOL (ISOVUE-370) INJECTION 76% COMPARISON:  Chest radiograph April 11, 2018 FINDINGS: Cardiovascular: There is no demonstrable pulmonary embolus. There is no thoracic aortic aneurysm or dissection. The visualized great vessels appear unremarkable. Note that the right innominate and left  common carotid arteries arise as a common trunk, an anatomic variant. There are foci of coronary artery calcification. There is no pericardial effusion or pericardial thickening. Mediastinum/Nodes: Thyroid appears unremarkable. There is no appreciable thoracic adenopathy. No esophageal lesions are evident. Lungs/Pleura: There is no appreciable edema or consolidation. There is slight bibasilar atelectasis. Upper Abdomen: Visualized upper abdominal structures appear unremarkable. Musculoskeletal: There are foci of degenerative change in the thoracic spine. There are no blastic or lytic bone lesions. No intramuscular lesions are evident. Review of the MIP images confirms the above findings. IMPRESSION: 1. No demonstrable pulmonary embolus. No thoracic aortic aneurysm or dissection. There are foci of coronary artery calcification. 2.  Slight atelectatic change.  No edema or consolidation. 3.  No evident thoracic adenopathy. Electronically Signed   By: Lowella Grip III M.D.   On: 04/11/2018 17:08   US Abdomen Limited Ruq  Result Date: 04/11/2018 CLINICAL DATA:  Upper abdominal pain EXAM: ULTRASOUND ABDOMEN LIMITED RIGHT UPPER QUADRANT COMPARISON:  CT abdomen and pelvis March 01, 2017 FINDINGS: Gallbladder: No gallstones or wall thickening visualized. There is no  pericholecystic fluid. No sonographic Murphy sign noted by sonographer. Common bile duct: Diameter: 4 mm. No intrahepatic or extrahepatic biliary duct dilatation. Liver: No focal lesion identified. Liver echogenicity overall is mildly increased. Portal vein is patent on color Doppler imaging with normal direction of blood flow towards the liver. IMPRESSION: Mild increase in liver echotexture, a finding likely indicative of a degree of hepatic steatosis. No focal liver lesions are demonstrable on this study. Study otherwise unremarkable. Electronically Signed   By: Lowella Grip III M.D.   On: 04/11/2018 15:05     ____________________________________________   INITIAL IMPRESSION / ASSESSMENT AND PLAN / ED COURSE  Very atypical sounding chest pain for ACS I think that is unlikely at this point.  Could be her gallbladder she is been having diarrhea and some nausea with it and it did start in the right mid back and radiated around to the front.  Also consider possible pyelonephritis versus kidney stone however she does not have any pain radiating towards her lower pelvis.  Blood clot is a possibility however normal saturations normal oxygenation normal pulse normal EKG make that less likely.  No other risk factors either.  For now we will treat symptomatically and reevaluate for disposition.  Patient with some intermittent slightly low oxygen so we will get a CTA and repeat her troponin.  Heart score of 2.  Work-up unremarkable.  Unsure of the cause of her symptoms.  Will DC on anti-inflammatories and PCP follow-up.  Pertinent labs & imaging results that were available during my care of the patient were reviewed by me and considered in my medical decision making (see chart for details).  ____________________________________________  FINAL CLINICAL IMPRESSION(S) / ED DIAGNOSES  Final diagnoses:  Right-sided back pain     MEDICATIONS GIVEN DURING THIS VISIT:  Medications  HYDROmorphone (DILAUDID) injection 0.5 mg (0.5 mg Intravenous Given 04/11/18 1455)  iopamidol (ISOVUE-370) 76 % injection 150 mL (100 mLs Intravenous Contrast Given 04/11/18 1646)  ketorolac (TORADOL) 30 MG/ML injection 15 mg (15 mg Intravenous Given 04/11/18 1810)     NEW OUTPATIENT MEDICATIONS STARTED DURING THIS VISIT:  New Prescriptions   No medications on file    Note:  This note was prepared with assistance of Dragon voice recognition software. Occasional wrong-word or sound-a-like substitutions may have occurred due to the inherent limitations of voice recognition software.   Merrily Pew, MD 04/11/18 [phone]

## 2018-08-01 ENCOUNTER — Other Ambulatory Visit: Payer: Self-pay | Admitting: Physician Assistant

## 2018-08-01 ENCOUNTER — Other Ambulatory Visit (HOSPITAL_COMMUNITY)
Admission: RE | Admit: 2018-08-01 | Discharge: 2018-08-01 | Disposition: A | Discharge status: Home / Self Care | Payer: 59 | Source: Ambulatory Visit | Attending: Physician Assistant | Admitting: Physician Assistant

## 2018-08-01 ENCOUNTER — Ambulatory Visit: Payer: Self-pay | Admitting: Physician Assistant

## 2018-08-01 ENCOUNTER — Encounter: Payer: Self-pay | Admitting: Physician Assistant

## 2018-08-01 ENCOUNTER — Other Ambulatory Visit: Payer: Self-pay

## 2018-08-01 VITALS — BP 136/80 | HR 67 | Temp 97.3°F | Wt 168.1 lb

## 2018-08-01 DIAGNOSIS — Z1211 Encounter for screening for malignant neoplasm of colon: Secondary | ICD-10-CM

## 2018-08-01 DIAGNOSIS — E1140 Type 2 diabetes mellitus with diabetic neuropathy, unspecified: Secondary | ICD-10-CM | POA: Insufficient documentation

## 2018-08-01 DIAGNOSIS — Z789 Other specified health status: Secondary | ICD-10-CM

## 2018-08-01 DIAGNOSIS — E669 Obesity, unspecified: Secondary | ICD-10-CM

## 2018-08-01 DIAGNOSIS — Z7689 Persons encountering health services in other specified circumstances: Secondary | ICD-10-CM

## 2018-08-01 DIAGNOSIS — I10 Essential (primary) hypertension: Secondary | ICD-10-CM

## 2018-08-01 DIAGNOSIS — E785 Hyperlipidemia, unspecified: Secondary | ICD-10-CM

## 2018-08-01 DIAGNOSIS — E1139 Type 2 diabetes mellitus with other diabetic ophthalmic complication: Secondary | ICD-10-CM

## 2018-08-01 LAB — COMPREHENSIVE METABOLIC PANEL
ALT: 19 U/L (ref 0–44)
AST: 18 U/L (ref 15–41)
Albumin: 3.9 g/dL (ref 3.5–5.0)
Alkaline Phosphatase: 84 U/L (ref 38–126)
Anion gap: 8 (ref 5–15)
BUN: 17 mg/dL (ref 6–20)
CO2: 28 mmol/L (ref 22–32)
Calcium: 9.4 mg/dL (ref 8.9–10.3)
Chloride: 105 mmol/L (ref 98–111)
Creatinine, Ser: 0.74 mg/dL (ref 0.44–1.00)
GFR calc Af Amer: >60 mL/min (ref >60)
GFR calc non Af Amer: >60 mL/min (ref >60)
Glucose, Bld: 124 mg/dL — ABNORMAL HIGH (ref 70–99)
Potassium: 4.8 mmol/L (ref 3.5–5.1)
Sodium: 141 mmol/L (ref 135–145)
Total Bilirubin: 0.7 mg/dL (ref 0.3–1.2)
Total Protein: 7.3 g/dL (ref 6.5–8.1)

## 2018-08-01 LAB — LIPID PANEL
Cholesterol: 214 mg/dL — ABNORMAL HIGH (ref 0–200)
HDL: 46 mg/dL (ref >40)
LDL Cholesterol: 138 mg/dL — ABNORMAL HIGH (ref 0–99)
Total CHOL/HDL Ratio: 4.7 RATIO
Triglycerides: 148 mg/dL (ref <150)
VLDL: 30 mg/dL (ref 0–40)

## 2018-08-01 LAB — HEMOGLOBIN A1C
Hgb A1c MFr Bld: 8.5 % — ABNORMAL HIGH (ref 4.8–5.6)
Mean Plasma Glucose: 197.25 mg/dL

## 2018-08-01 MED ORDER — LOSARTAN POTASSIUM 50 MG PO TABS: ORAL_TABLET | ORAL | 0 refills | Status: DC

## 2018-08-01 MED ORDER — METFORMIN HCL 1000 MG PO TABS: 1000.0000 mg | ORAL_TABLET | Freq: Two times a day (BID) | ORAL | 0 refills | Status: DC

## 2018-08-01 MED ORDER — NOVOLIN 70/30 (70-30) 100 UNIT/ML ~~LOC~~ SUSP: SUBCUTANEOUS | 11 refills | Status: DC

## 2018-08-01 MED ORDER — GABAPENTIN 300 MG PO CAPS: 300.0000 mg | ORAL_CAPSULE | Freq: Two times a day (BID) | ORAL | 1 refills | Status: DC

## 2018-08-01 MED ORDER — ATORVASTATIN CALCIUM 20 MG PO TABS: ORAL_TABLET | ORAL | 1 refills | Status: DC

## 2018-08-01 NOTE — Patient Instructions (Signed)
Para [place] [facility]: -formulario 1040 de los impuestos del ano 2019 -Carta de apoyo llenada por su hija

## 2018-08-01 NOTE — Progress Notes (Signed)
BP 136/80   Pulse 67   Temp (!) 97.3 F (36.3 C)   Wt 168 lb 1.6 oz (76.2 kg)   SpO2 99%   BMI 32.83 kg/m    Subjective:    Patient ID: Amanda Zamora, female    DOB: 15-Dec-1960, 58 y.o.   MRN: [id number]  HPI: Amanda Zamora is a 58 y.o. female presenting on 08/01/2018 for New Patient (Initial Visit) (previous [facility] patient. pt was last seen at [facility] 02/2015. pt has been recieving care at [facility]. [place] in [place], [place]. pt can no longer afford to go there as she lost her insurance. pt was last seen at her old PCP 03/2018.)   HPI   Chief Complaint  Patient presents with  . New Patient (Initial Visit)    previous [facility] patient. pt was last seen at [facility] 02/2015. pt has been recieving care at [facility]. [place] in [place], [place]. pt can no longer afford to go there as she lost her insurance. pt was last seen at her old PCP 03/2018.     Pt had negative screeningquestionnairefor CV19    Pt says last saw dr Vallarie Mare in Feb.  She last saw her eye dr in 2 years ago .  She lost her insurance in march.  Pt didn't go to eye dr because her insurance didn't cover eye doctor.  She says she didn't have colonoscopy when she had insurance either.  She says because it was never offered.  Pt has not had mammogram in 2 years also.    Pt without complaint today  Pt says she is wearing a mask when she goes out in public  Relevant past medical, surgical, family and social history reviewed and updated as indicated. Interim medical history since our last visit reviewed. Allergies and medications reviewed and updated.   Current Outpatient Medications:  .  insulin degludec (TRESIBA) 100 UNIT/ML SOPN FlexTouch Pen, Inject 50 Units into the skin 1 day or 1 dose., Disp: , Rfl:  .  losartan (COZAAR) 50 MG tablet, TAKE 1 TABLET BY MOUTH EVERY DAY FOR BLOOD PRESSURE, Disp: 30 tablet, Rfl: 0 .  metFORMIN (GLUCOPHAGE) 1000 MG tablet, Take 1 tablet (1,000 mg total) by mouth 2 (two) times  daily with a meal. Tome una tableta por boca dos veces diarias con comida, Disp: 60 tablet, Rfl: 2 .  Dulaglutide (TRULICITY [place]), Inject into the skin once a week., Disp: , Rfl:  .  gabapentin (NEURONTIN) 300 MG capsule, Take 1 capsule (300 mg total) by mouth 2 (two) times daily. Tome una tableta por boca dos veces diarias (Patient not taking: Reported on 08/01/2018), Disp: 60 capsule, Rfl: 1 .  ibuprofen (ADVIL,MOTRIN) 800 MG tablet, Take 800 mg by mouth every 8 (eight) hours as needed., Disp: , Rfl:  .  loratadine (CLARITIN) 10 MG tablet, Take 10 mg by mouth daily., Disp: , Rfl:  .  lovastatin (MEVACOR) 20 MG tablet, Take 20 mg by mouth at bedtime., Disp: , Rfl:  .  meloxicam (MOBIC) 7.5 MG tablet, Take 1 tablet (7.5 mg total) by mouth daily. (Patient not taking: Reported on 08/01/2018), Disp: 10 tablet, Rfl: 0    Review of Systems  Per HPI unless specifically indicated above     Objective:    BP 136/80   Pulse 67   Temp (!) 97.3 F (36.3 C)   Wt 168 lb 1.6 oz (76.2 kg)   SpO2 99%   BMI 32.83 kg/m  Wt Readings from Last 3 Encounters:  08/01/18 168 lb 1.6 oz (76.2 kg)  04/11/18 158 lb (71.7 kg)  03/23/16 168 lb (76.2 kg)    Physical Exam Vitals signs reviewed.  Constitutional:      General: She is not in acute distress.    Appearance: She is well-developed. She is obese. She is not ill-appearing.  HENT:     Head: Normocephalic and atraumatic.  Eyes:     Conjunctiva/sclera: Conjunctivae normal.     Pupils: Pupils are equal, round, and reactive to light.  Neck:     Musculoskeletal: Neck supple.     Thyroid: No thyromegaly.  Cardiovascular:     Rate and Rhythm: Normal rate and regular rhythm.  Pulmonary:     Effort: Pulmonary effort is normal.     Breath sounds: Normal breath sounds.  Abdominal:     General: Bowel sounds are normal.     Palpations: Abdomen is soft. There is no mass.     Tenderness: There is no abdominal tenderness.  Musculoskeletal:     Right lower  leg: No edema.     Left lower leg: No edema.  Lymphadenopathy:     Cervical: No cervical adenopathy.  Skin:    General: Skin is warm and dry.  Neurological:     Mental Status: She is alert and oriented to person, place, and time.     Gait: Gait normal.  Psychiatric:        Attention and Perception: Attention normal.        Mood and Affect: Mood normal.        Speech: Speech normal.        Behavior: Behavior normal.           Assessment & Plan:    Encounter Diagnoses  Name Primary?  . Encounter to establish care   . Type 2 diabetes mellitus with other ophthalmic complication, unspecified whether long term insulin use ([place]) Yes  . Essential hypertension   . Hyperlipidemia, unspecified hyperlipidemia type   . Obesity, unspecified classification, unspecified obesity type, unspecified whether serious comorbidity present   . Type 2 diabetes mellitus with diabetic neuropathy, unspecified whether long term insulin use ([place])   . Not proficient in [place] language   . Screening for colon cancer       -will Update labs, screening mammogram, iFOBT, Diabetic eye exam -Pt will need to go back on novolin 70/30 when she runs out of tresiba as there is no way to get it for her for free and it is very expensive.  Pt is counseled to monitor her bs and call office for fbs < 70 or > 300 -pt is signed up for [facility] for her statin, ARB and metformin -pt is encouraged to wear mask when in public to reduce risk of CV19 -pt to follow-up 1 month to review bs and labs

## 2018-08-01 NOTE — Progress Notes (Unsigned)
ifob

## 2018-08-02 ENCOUNTER — Other Ambulatory Visit: Payer: Self-pay | Admitting: Physician Assistant

## 2018-08-02 DIAGNOSIS — Z1239 Encounter for other screening for malignant neoplasm of breast: Secondary | ICD-10-CM

## 2018-08-02 DIAGNOSIS — Z1211 Encounter for screening for malignant neoplasm of colon: Secondary | ICD-10-CM

## 2018-08-02 LAB — MICROALBUMIN, URINE: Microalb, Ur: 9.4 ug/mL — ABNORMAL HIGH

## 2018-08-08 LAB — IFOBT (OCCULT BLOOD): IFOBT: NEGATIVE

## 2018-08-13 ENCOUNTER — Other Ambulatory Visit: Payer: Self-pay

## 2018-08-13 ENCOUNTER — Ambulatory Visit (HOSPITAL_COMMUNITY)
Admission: RE | Admit: 2018-08-13 | Discharge: 2018-08-13 | Disposition: A | Discharge status: Home / Self Care | Payer: Self-pay | Source: Ambulatory Visit | Attending: Physician Assistant | Admitting: Physician Assistant

## 2018-08-13 DIAGNOSIS — Z1239 Encounter for other screening for malignant neoplasm of breast: Secondary | ICD-10-CM | POA: Insufficient documentation

## 2018-08-14 ENCOUNTER — Other Ambulatory Visit (HOSPITAL_COMMUNITY): Payer: Self-pay | Admitting: Physician Assistant

## 2018-08-14 DIAGNOSIS — R928 Other abnormal and inconclusive findings on diagnostic imaging of breast: Secondary | ICD-10-CM

## 2018-08-15 ENCOUNTER — Other Ambulatory Visit: Payer: Self-pay | Admitting: Physician Assistant

## 2018-08-28 ENCOUNTER — Ambulatory Visit (HOSPITAL_COMMUNITY)
Admission: RE | Admit: 2018-08-28 | Discharge: 2018-08-28 | Disposition: A | Discharge status: Home / Self Care | Payer: Self-pay | Source: Ambulatory Visit | Attending: Physician Assistant | Admitting: Physician Assistant

## 2018-08-28 ENCOUNTER — Ambulatory Visit (HOSPITAL_COMMUNITY): Admission: RE | Admit: 2018-08-28 | Payer: Self-pay | Source: Ambulatory Visit

## 2018-08-28 ENCOUNTER — Other Ambulatory Visit: Payer: Self-pay

## 2018-08-28 DIAGNOSIS — R928 Other abnormal and inconclusive findings on diagnostic imaging of breast: Secondary | ICD-10-CM | POA: Insufficient documentation

## 2018-08-29 ENCOUNTER — Encounter: Payer: Self-pay | Admitting: Physician Assistant

## 2018-08-29 ENCOUNTER — Ambulatory Visit: Payer: Self-pay | Admitting: Physician Assistant

## 2018-09-03 ENCOUNTER — Ambulatory Visit: Payer: Self-pay | Admitting: Physician Assistant

## 2018-10-09 ENCOUNTER — Ambulatory Visit: Payer: Self-pay | Admitting: Physician Assistant

## 2018-10-09 ENCOUNTER — Encounter: Payer: Self-pay | Admitting: Physician Assistant

## 2018-10-09 DIAGNOSIS — Z789 Other specified health status: Secondary | ICD-10-CM

## 2018-10-09 DIAGNOSIS — I10 Essential (primary) hypertension: Secondary | ICD-10-CM

## 2018-10-09 DIAGNOSIS — E1165 Type 2 diabetes mellitus with hyperglycemia: Secondary | ICD-10-CM

## 2018-10-09 DIAGNOSIS — R928 Other abnormal and inconclusive findings on diagnostic imaging of breast: Secondary | ICD-10-CM

## 2018-10-09 DIAGNOSIS — E785 Hyperlipidemia, unspecified: Secondary | ICD-10-CM

## 2018-10-09 NOTE — Progress Notes (Signed)
There were no vitals taken for this visit.   Subjective:    Patient ID: Amanda Zamora, female    DOB: 1960/08/13, 58 y.o.   MRN: [id number]  HPI: Amanda Zamora is a 58 y.o. female presenting on 10/09/2018 for Diabetes   HPI   This is a telemedicine appointment due to coronavirus pandemic.  It is via telephone as pt does not have a video enabled device  I connected with  Amanda Zamora on 10/09/18 by a video enabled telemedicine application and verified that I am speaking with the correct person using two identifiers.   I discussed the limitations of evaluation and management by telemedicine. The patient expressed understanding and agreed to proceed.  Pt is at work.  Provider is at office.     She has gone back to using 70/30 insulin.  She is supposed to be using 50 units bid.  Last week her bs was 426 one time- the highest.  Lowest 193.    She is taking her insulin between 9am-12 noon which is her morning meal.  She has been using only 50  units in the morning.  Last time she took 50 units at night  she had nocturnal bs 56.    She is taking her evening dose after she has her supper which is around 5:30pm.  She is checking her bs in the morning around 9am before her insulin.  And then around 3pm.   She uses    25 Units insulin in the everning.  Pt is a difficult historian   This morning her bs was 191.    She had mammogram and needed diagnostic.  Recommended to get follow up diagnostic mammogram in 6 months.      She bought a bp machine- she doesn't know what her bp is running lately because that information is at home and she is at work.        Relevant past medical, surgical, family and social history reviewed and updated as indicated. Interim medical history since our last visit reviewed. Allergies and medications reviewed and updated.    Current Outpatient Medications:  .  atorvastatin (LIPITOR) 20 MG tablet, 1 po qd. Tome una tableta por boca diaria, Disp: 90  tablet, Rfl: 1 .  gabapentin (NEURONTIN) 300 MG capsule, Take 1 capsule (300 mg total) by mouth 2 (two) times daily. Tome una tableta por [employer] veces diarias, Disp: 60 capsule, Rfl: 1 .  insulin NPH-regular Human (NOVOLIN 70/30) (70-30) 100 UNIT/ML injection, Inject 50 units SQ twice daily before meal, Injecte 50 unidades subcutaneamente dos veces al dia con el alimento, Disp: 10 mL, Rfl: 11 .  losartan (COZAAR) 50 MG tablet, 1 po qd.  Tome una tableta por boca diaria, Disp: 90 tablet, Rfl: 0 .  metFORMIN (GLUCOPHAGE) 1000 MG tablet, Take 1 tablet (1,000 mg total) by mouth 2 (two) times daily with a meal. Tome una tableta por boca dos veces diarias con comida, Disp: 180 tablet, Rfl: 0     Review of Systems  Per HPI unless specifically indicated above     Objective:    There were no vitals taken for this visit.  Wt Readings from Last 3 Encounters:  08/01/18 168 lb 1.6 oz (76.2 kg)  04/11/18 158 lb (71.7 kg)  03/23/16 168 lb (76.2 kg)    Physical Exam Pulmonary:     Effort: No respiratory distress.  Neurological:     Mental Status: She is alert and oriented to person, place,  and time.  Psychiatric:        Attention and Perception: Attention normal.        Speech: Speech normal.        Behavior: Behavior is cooperative.     Results for orders placed or performed during the hospital encounter of 08/01/18  Microalbumin, urine  Result Value Ref Range   Microalb, Ur 9.4 (H) Not Estab. ug/mL  Hemoglobin A1c  Result Value Ref Range   Hgb A1c MFr Bld 8.5 (H) 4.8 - 5.6 %   Mean Plasma Glucose 197.25 mg/dL  Lipid panel  Result Value Ref Range   Cholesterol 214 (H) 0 - 200 mg/dL   Triglycerides 148 <150 mg/dL   HDL 46 >40 mg/dL   Total CHOL/HDL Ratio 4.7 RATIO   VLDL 30 0 - 40 mg/dL   LDL Cholesterol 138 (H) 0 - 99 mg/dL  Comprehensive metabolic panel  Result Value Ref Range   Sodium 141 135 - 145 mmol/L   Potassium 4.8 3.5 - 5.1 mmol/L   Chloride 105 98 - 111 mmol/L    CO2 28 22 - 32 mmol/L   Glucose, Bld 124 (H) 70 - 99 mg/dL   BUN 17 6 - 20 mg/dL   Creatinine, Ser 0.74 0.44 - 1.00 mg/dL   Calcium 9.4 8.9 - 10.3 mg/dL   Total Protein 7.3 6.5 - 8.1 g/dL   Albumin 3.9 3.5 - 5.0 g/dL   AST 18 15 - 41 U/L   ALT 19 0 - 44 U/L   Alkaline Phosphatase 84 38 - 126 U/L   Total Bilirubin 0.7 0.3 - 1.2 mg/dL   GFR calc non Af Amer >60 >60 mL/min   GFR calc Af Amer >60 >60 mL/min   Anion gap 8 5 - 15      Assessment & Plan:    Encounter Diagnoses  Name Primary?  Amanda Zamora Kitchen Uncontrolled type 2 diabetes mellitus with hyperglycemia ([place]) Yes  . Hyperlipidemia, unspecified hyperlipidemia type   . Essential hypertension   . Abnormal mammogram   . Not proficient in [place] language      -reviewed labs with pt   -pt to Continue novolin 70/30 50 un qam and Increase evening dose to 30 units  -she is to Monitor fbs.  She is reminded to call office for fbs < 70 or > 300  -pt is to monior her bp at home.  She is to have this information with her at her next appointment  -pt has already had her statin changed from lovastatin to atorvastatin so that should improve her lipids.  She is reminded to follow lowfat diet  -reviewed recommendation to repeat diagnostic mammogram in 6 months with pt  -follow up appointment with evisit 1 month.  She is to contact office sooner prn

## 2018-10-10 DIAGNOSIS — R928 Other abnormal and inconclusive findings on diagnostic imaging of breast: Secondary | ICD-10-CM | POA: Insufficient documentation

## 2018-10-10 MED ORDER — NOVOLIN 70/30 (70-30) 100 UNIT/ML ~~LOC~~ SUSP: SUBCUTANEOUS | 11 refills | Status: DC

## 2018-11-08 ENCOUNTER — Encounter: Payer: Self-pay | Admitting: Physician Assistant

## 2018-11-08 ENCOUNTER — Ambulatory Visit: Payer: Self-pay | Admitting: Physician Assistant

## 2018-11-08 DIAGNOSIS — E785 Hyperlipidemia, unspecified: Secondary | ICD-10-CM

## 2018-11-08 DIAGNOSIS — E1165 Type 2 diabetes mellitus with hyperglycemia: Secondary | ICD-10-CM

## 2018-11-08 DIAGNOSIS — I10 Essential (primary) hypertension: Secondary | ICD-10-CM

## 2018-11-08 DIAGNOSIS — Z789 Other specified health status: Secondary | ICD-10-CM

## 2018-11-08 MED ORDER — LOSARTAN POTASSIUM 100 MG PO TABS: ORAL_TABLET | ORAL | 3 refills | Status: DC

## 2018-11-08 NOTE — Progress Notes (Signed)
There were no vitals taken for this visit.   Subjective:    Patient ID: Amanda Zamora, female    DOB: [birth year], 58 y.o.   MRN: [id number]  HPI: Amanda Zamora is a 58 y.o. female presenting on 11/08/2018 for Diabetes and Follow-up (pt last checked her bp on 11-06-18 143/83)   HPI   This is a telemedicine appointment due to coronavirus pandemic.  It is via Telephone as pt does not have a video enable device.  I connected with  Amanda Zamora on 11/08/18 by a video enabled telemedicine application and verified that I am speaking with the correct person using two identifiers.   I discussed the limitations of evaluation and management by telemedicine. The patient expressed understanding and agreed to proceed.  Pt is at work.  Provider is at office.       Pt stopped her cholesterol medication - she was thinking that the medication would make her sweat and feel anxious.    She says it happened 3 times and since she stopped it she has not had it happened since she stopped the rx.    She checks bp every other day and it is usually in the 140s.    Other readings: 155, 133, 138, 143, 0000000 for the systolic values  She is checking her bs and those are running  With 278 is the highest and  96 is the lowest.    She is using 50units and 30 units of 70/30 insulin.     She is feeling pretty good and has no complaints.     Relevant past medical, surgical, family and social history reviewed and updated as indicated. Interim medical history since our last visit reviewed. Allergies and medications reviewed and updated.   Current Outpatient Medications:  .  gabapentin (NEURONTIN) 300 MG capsule, Take 1 capsule (300 mg total) by mouth 2 (two) times daily. Tome una tableta por [employer] veces diarias (Patient taking differently: Take 300 mg by mouth at bedtime. Tome una tableta por boca dos veces diarias), Disp: 60 capsule, Rfl: 1 .  insulin NPH-regular Human (NOVOLIN 70/30) (70-30) 100 UNIT/ML  injection, Inject 50 units SQ before breakfast and 30 units before evening meal.   Injecte 50 unidades subcutaneamente dos veces al dia con el alimento y 30 unidades antes de el alimento de la tarde, Disp: 10 mL, Rfl: 11 .  losartan (COZAAR) 50 MG tablet, 1 po qd.  Tome una tableta por boca diaria, Disp: 90 tablet, Rfl: 0 .  metFORMIN (GLUCOPHAGE) 1000 MG tablet, Take 1 tablet (1,000 mg total) by mouth 2 (two) times daily with a meal. Tome una tableta por boca dos veces diarias con comida, Disp: 180 tablet, Rfl: 0 .  Omega-3 Fatty Acids (FISH OIL) 1000 MG CAPS, Take by mouth., Disp: , Rfl:  .  atorvastatin (LIPITOR) 20 MG tablet, 1 po qd. Tome una tableta por boca diaria (Patient not taking: Reported on 11/08/2018), Disp: 90 tablet, Rfl: 1      Review of Systems  Per HPI unless specifically indicated above     Objective:    There were no vitals taken for this visit.  Wt Readings from Last 3 Encounters:  08/01/18 168 lb 1.6 oz (76.2 kg)  04/11/18 158 lb (71.7 kg)  03/23/16 168 lb (76.2 kg)    Physical Exam Pulmonary:     Effort: No respiratory distress.  Neurological:     Mental Status: She is alert and oriented to person, place, and  time.  Psychiatric:        Attention and Perception: Attention normal.        Speech: Speech normal.        Behavior: Behavior is cooperative.           Assessment & Plan:     Encounter Diagnoses  Name Primary?  Amanda Zamora Uncontrolled type 2 diabetes mellitus with hyperglycemia ([place]) Yes  . Hyperlipidemia, unspecified hyperlipidemia type   . Essential hypertension   . Not proficient in [place] language       -pt counseled to Restart statin.  Discussed that symptoms she described are unlikely to have been caused by her medication -will Increase losartan -she is to watch diabetic diet.  She is to continue to monitor the blood sugars -pt will follow up 1 month with bs log.  Will update PAP at that appointment.  Pt to contact office sooner  prn

## 2018-11-26 ENCOUNTER — Other Ambulatory Visit (HOSPITAL_COMMUNITY)
Admission: RE | Admit: 2018-11-26 | Discharge: 2018-11-26 | Disposition: A | Discharge status: Home / Self Care | Payer: Self-pay | Source: Ambulatory Visit | Attending: Physician Assistant | Admitting: Physician Assistant

## 2018-11-26 DIAGNOSIS — I10 Essential (primary) hypertension: Secondary | ICD-10-CM

## 2018-11-26 DIAGNOSIS — E1165 Type 2 diabetes mellitus with hyperglycemia: Secondary | ICD-10-CM

## 2018-11-26 DIAGNOSIS — E785 Hyperlipidemia, unspecified: Secondary | ICD-10-CM

## 2018-11-26 LAB — COMPREHENSIVE METABOLIC PANEL
ALT: 16 U/L (ref 0–44)
AST: 16 U/L (ref 15–41)
Albumin: 3.9 g/dL (ref 3.5–5.0)
Alkaline Phosphatase: 75 U/L (ref 38–126)
Anion gap: 11 (ref 5–15)
BUN: 18 mg/dL (ref 6–20)
CO2: 26 mmol/L (ref 22–32)
Calcium: 9.1 mg/dL (ref 8.9–10.3)
Chloride: 101 mmol/L (ref 98–111)
Creatinine, Ser: 0.85 mg/dL (ref 0.44–1.00)
GFR calc Af Amer: >60 mL/min (ref >60)
GFR calc non Af Amer: >60 mL/min (ref >60)
Glucose, Bld: 219 mg/dL — ABNORMAL HIGH (ref 70–99)
Potassium: 4.4 mmol/L (ref 3.5–5.1)
Sodium: 138 mmol/L (ref 135–145)
Total Bilirubin: 0.4 mg/dL (ref 0.3–1.2)
Total Protein: 7.5 g/dL (ref 6.5–8.1)

## 2018-11-26 LAB — LIPID PANEL
Cholesterol: 152 mg/dL (ref 0–200)
HDL: 43 mg/dL (ref >40)
LDL Cholesterol: 75 mg/dL (ref 0–99)
Total CHOL/HDL Ratio: 3.5 RATIO
Triglycerides: 170 mg/dL — ABNORMAL HIGH (ref <150)
VLDL: 34 mg/dL (ref 0–40)

## 2018-11-26 LAB — HEMOGLOBIN A1C
Hgb A1c MFr Bld: 8.7 % — ABNORMAL HIGH (ref 4.8–5.6)
Mean Plasma Glucose: 202.99 mg/dL

## 2018-11-28 ENCOUNTER — Ambulatory Visit: Payer: Self-pay | Admitting: Physician Assistant

## 2018-11-28 ENCOUNTER — Other Ambulatory Visit: Payer: Self-pay

## 2018-11-28 ENCOUNTER — Encounter: Payer: Self-pay | Admitting: Physician Assistant

## 2018-11-28 VITALS — BP 150/80 | HR 67 | Temp 97.7°F | Ht <= 58 in | Wt 168.0 lb

## 2018-11-28 DIAGNOSIS — I10 Essential (primary) hypertension: Secondary | ICD-10-CM

## 2018-11-28 DIAGNOSIS — E1165 Type 2 diabetes mellitus with hyperglycemia: Secondary | ICD-10-CM

## 2018-11-28 DIAGNOSIS — E669 Obesity, unspecified: Secondary | ICD-10-CM

## 2018-11-28 DIAGNOSIS — S39012A Strain of muscle, fascia and tendon of lower back, initial encounter: Secondary | ICD-10-CM

## 2018-11-28 DIAGNOSIS — Z789 Other specified health status: Secondary | ICD-10-CM

## 2018-11-28 DIAGNOSIS — E785 Hyperlipidemia, unspecified: Secondary | ICD-10-CM

## 2018-11-28 MED ORDER — CYCLOBENZAPRINE HCL 10 MG PO TABS: ORAL_TABLET | ORAL | 0 refills | Status: DC

## 2018-11-28 MED ORDER — DICLOFENAC SODIUM 75 MG PO TBEC: DELAYED_RELEASE_TABLET | ORAL | 0 refills | Status: DC

## 2018-11-28 MED ORDER — METHYLPREDNISOLONE ACETATE 80 MG/ML IJ SUSP
80.0000 mg | Freq: Once | INTRAMUSCULAR | Status: AC
  Administered 2018-11-28: 09:00:00 80 mg via INTRAMUSCULAR

## 2018-11-28 NOTE — Progress Notes (Signed)
BP (!) 150/80   Pulse 67   Temp 97.7 F (36.5 C)   Ht 4\' 9"  (1.448 m)   Wt 168 lb (76.2 kg)   SpO2 98%   BMI 36.35 kg/m    Subjective:    Patient ID: Amanda Zamora, female    DOB: 1960-02-28, 58 y.o.   MRN: [id number]  HPI: Amanda Zamora is a 58 y.o. female presenting on 11/28/2018 for No chief complaint on file.   HPI  Pt had a negative covid 63 screening questionnaire  Patient is here today to follow-up on diabetes and hypertension.  She is also scheduled to update her Pap smear.  Patient states that her back is hurting very badly.  That she does not want to get her Pap smear updated today.  Patient states back pain started Friday after putting some boxes in her car at work.    The pain started about 10 minutes after doing that task.  Patient says the pain is radiating downwards into both lower extremities, but does not go past the thighs.  She is having no numbness or tingling.  She is having no weakness.     Relevant past medical, surgical, family and social history reviewed and updated as indicated. Interim medical history since our last visit reviewed. Allergies and medications reviewed and updated.   Current Outpatient Medications:  .  atorvastatin (LIPITOR) 20 MG tablet, 1 po qd. Tome una tableta por boca diaria, Disp: 90 tablet, Rfl: 1 .  gabapentin (NEURONTIN) 300 MG capsule, Take 1 capsule (300 mg total) by mouth 2 (two) times daily. Tome una tableta por [employer] veces diarias (Patient taking differently: Take 300 mg by mouth at bedtime. Tome una tableta por boca dos veces diarias), Disp: 60 capsule, Rfl: 1 .  insulin NPH-regular Human (NOVOLIN 70/30) (70-30) 100 UNIT/ML injection, Inject 50 units SQ before breakfast and 30 units before evening meal.   Injecte 50 unidades subcutaneamente dos veces al dia con el alimento y 30 unidades antes de el alimento de la tarde, Disp: 10 mL, Rfl: 11 .  losartan (COZAAR) 100 MG tablet, 1 po qd.  Tome una tableta por boca  diaria, Disp: 30 tablet, Rfl: 3 .  metFORMIN (GLUCOPHAGE) 1000 MG tablet, Take 1 tablet (1,000 mg total) by mouth 2 (two) times daily with a meal. Tome una tableta por boca dos veces diarias con comida, Disp: 180 tablet, Rfl: 0 .  Omega-3 Fatty Acids (FISH OIL) 1000 MG CAPS, Take by mouth., Disp: , Rfl:    Review of Systems  Per HPI unless specifically indicated above     Objective:    BP (!) 150/80   Pulse 67   Temp 97.7 F (36.5 C)   Ht 4\' 9"  (1.448 m)   Wt 168 lb (76.2 kg)   SpO2 98%   BMI 36.35 kg/m   Wt Readings from Last 3 Encounters:  11/28/18 168 lb (76.2 kg)  08/01/18 168 lb 1.6 oz (76.2 kg)  04/11/18 158 lb (71.7 kg)    Physical Exam Vitals signs reviewed.  Constitutional:      Appearance: She is well-developed. She is obese. She is not ill-appearing or toxic-appearing.     Comments: Patient is in obvious discomfort.  This is increased with movement.  HENT:     Head: Normocephalic and atraumatic.  Neck:     Musculoskeletal: Neck supple.  Cardiovascular:     Rate and Rhythm: Normal rate and regular rhythm.  Pulmonary:  Effort: Pulmonary effort is normal.     Breath sounds: Normal breath sounds.  Abdominal:     General: Bowel sounds are normal.     Palpations: Abdomen is soft. There is no mass.     Tenderness: There is no abdominal tenderness.  Musculoskeletal:     Lumbar back: She exhibits decreased range of motion and tenderness. She exhibits no swelling and no deformity.     Right lower leg: No edema.     Left lower leg: No edema.     Comments: + SLR bilaterally.  Lower back is only mildly tender to palpation but patient does report significant pain in the area.  Lymphadenopathy:     Cervical: No cervical adenopathy.  Skin:    General: Skin is warm and dry.  Neurological:     Mental Status: She is alert and oriented to person, place, and time.  Psychiatric:        Attention and Perception: Attention normal.        Speech: Speech normal.         Behavior: Behavior normal. Behavior is cooperative.     Results for orders placed or performed during the hospital encounter of 11/26/18  Lipid panel  Result Value Ref Range   Cholesterol 152 0 - 200 mg/dL   Triglycerides 170 (H) <150 mg/dL   HDL 43 >40 mg/dL   Total CHOL/HDL Ratio 3.5 RATIO   VLDL 34 0 - 40 mg/dL   LDL Cholesterol 75 0 - 99 mg/dL  Comprehensive metabolic panel  Result Value Ref Range   Sodium 138 135 - 145 mmol/L   Potassium 4.4 3.5 - 5.1 mmol/L   Chloride 101 98 - 111 mmol/L   CO2 26 22 - 32 mmol/L   Glucose, Bld 219 (H) 70 - 99 mg/dL   BUN 18 6 - 20 mg/dL   Creatinine, Ser 0.85 0.44 - 1.00 mg/dL   Calcium 9.1 8.9 - 10.3 mg/dL   Total Protein 7.5 6.5 - 8.1 g/dL   Albumin 3.9 3.5 - 5.0 g/dL   AST 16 15 - 41 U/L   ALT 16 0 - 44 U/L   Alkaline Phosphatase 75 38 - 126 U/L   Total Bilirubin 0.4 0.3 - 1.2 mg/dL   GFR calc non Af Amer >60 >60 mL/min   GFR calc Af Amer >60 >60 mL/min   Anion gap 11 5 - 15  Hemoglobin A1c  Result Value Ref Range   Hgb A1c MFr Bld 8.7 (H) 4.8 - 5.6 %   Mean Plasma Glucose 202.99 mg/dL      Assessment & Plan:    1.  Uncontrolled diabetes  Reviewed labs with patient. Will Increase insulin Novolin 70/30 (currently using 50 and 30) to 55 and 35 units.  Patient is counseled to continue to monitor her blood sugars.  Patient is reminded to call office for fasting blood sugars less than 70 or over 300.Marland Kitchen  Patient is to continue her Metformin.  Patient has been referred for diabetic eye exam and is awaiting appointment.  2.  Hypertension  Suspect elevation of blood pressure today related to significant back pain that patient is experiencing today.  Patient to new current blood pressure medications and will recheck at subsequent office visit.  3.  Dyslipidemia  Patient to continue current atorvastatin and low-fat diet.  4.  Acute back strain  Patient is prescribed Flexeril muscle relaxer and diclofenac.  Patient is counseled to  use ice/heat on the area.  She is counseled to avoid overexertion.  Will not place patient on prednisone at this current time due to her diabetes.  Patient is given note for work  From Saturday to be out for 2 weeks   Patient is to follow-up in 3 months.  We will plan to update her Pap at that time.  Patient is counseled to call the office if her back pain worsens or persists or she develops any new symptoms.  Patient is in agreement with plan.

## 2018-11-29 ENCOUNTER — Ambulatory Visit: Payer: Self-pay | Admitting: Physician Assistant

## 2018-12-03 ENCOUNTER — Ambulatory Visit: Payer: Self-pay | Admitting: Physician Assistant

## 2018-12-17 ENCOUNTER — Other Ambulatory Visit: Payer: Self-pay | Admitting: Physician Assistant

## 2018-12-17 MED ORDER — GABAPENTIN 300 MG PO CAPS: 300.0000 mg | ORAL_CAPSULE | Freq: Two times a day (BID) | ORAL | 0 refills | Status: DC

## 2019-01-07 ENCOUNTER — Other Ambulatory Visit: Payer: Self-pay | Admitting: Physician Assistant

## 2019-01-07 MED ORDER — METFORMIN HCL 1000 MG PO TABS: 1000.0000 mg | ORAL_TABLET | Freq: Two times a day (BID) | ORAL | 3 refills | Status: DC

## 2019-02-22 ENCOUNTER — Other Ambulatory Visit (HOSPITAL_COMMUNITY)
Admission: RE | Admit: 2019-02-22 | Discharge: 2019-02-22 | Disposition: A | Discharge status: Home / Self Care | Payer: Self-pay | Source: Ambulatory Visit | Attending: Physician Assistant | Admitting: Physician Assistant

## 2019-02-22 DIAGNOSIS — E1165 Type 2 diabetes mellitus with hyperglycemia: Secondary | ICD-10-CM | POA: Insufficient documentation

## 2019-02-22 DIAGNOSIS — E785 Hyperlipidemia, unspecified: Secondary | ICD-10-CM | POA: Insufficient documentation

## 2019-02-22 DIAGNOSIS — I10 Essential (primary) hypertension: Secondary | ICD-10-CM | POA: Insufficient documentation

## 2019-02-22 LAB — COMPREHENSIVE METABOLIC PANEL
ALT: 18 U/L (ref 0–44)
AST: 17 U/L (ref 15–41)
Albumin: 3.7 g/dL (ref 3.5–5.0)
Alkaline Phosphatase: 76 U/L (ref 38–126)
Anion gap: 7 (ref 5–15)
BUN: 16 mg/dL (ref 6–20)
CO2: 26 mmol/L (ref 22–32)
Calcium: 8.9 mg/dL (ref 8.9–10.3)
Chloride: 105 mmol/L (ref 98–111)
Creatinine, Ser: 0.77 mg/dL (ref 0.44–1.00)
GFR calc Af Amer: >60 mL/min (ref >60)
GFR calc non Af Amer: >60 mL/min (ref >60)
Glucose, Bld: 168 mg/dL — ABNORMAL HIGH (ref 70–99)
Potassium: 4.4 mmol/L (ref 3.5–5.1)
Sodium: 138 mmol/L (ref 135–145)
Total Bilirubin: 0.6 mg/dL (ref 0.3–1.2)
Total Protein: 7.2 g/dL (ref 6.5–8.1)

## 2019-02-22 LAB — CBC
HCT: 35.3 % — ABNORMAL LOW (ref 36.0–46.0)
Hemoglobin: 11.4 g/dL — ABNORMAL LOW (ref 12.0–15.0)
MCH: 29.7 pg (ref 26.0–34.0)
MCHC: 32.3 g/dL (ref 30.0–36.0)
MCV: 91.9 fL (ref 80.0–100.0)
Platelets: 269 10*3/uL (ref 150–400)
RBC: 3.84 MIL/uL — ABNORMAL LOW (ref 3.87–5.11)
RDW: 14.5 % (ref 11.5–15.5)
WBC: 5.8 10*3/uL (ref 4.0–10.5)
nRBC: 0 % (ref 0.0–0.2)

## 2019-02-22 LAB — HEMOGLOBIN A1C
Hgb A1c MFr Bld: 8.8 % — ABNORMAL HIGH (ref 4.8–5.6)
Mean Plasma Glucose: 205.86 mg/dL

## 2019-02-22 LAB — LIPID PANEL
Cholesterol: 221 mg/dL — ABNORMAL HIGH (ref 0–200)
HDL: 42 mg/dL (ref >40)
LDL Cholesterol: 128 mg/dL — ABNORMAL HIGH (ref 0–99)
Total CHOL/HDL Ratio: 5.3 RATIO
Triglycerides: 254 mg/dL — ABNORMAL HIGH (ref <150)
VLDL: 51 mg/dL — ABNORMAL HIGH (ref 0–40)

## 2019-02-28 ENCOUNTER — Other Ambulatory Visit (HOSPITAL_COMMUNITY)
Admission: RE | Admit: 2019-02-28 | Discharge: 2019-02-28 | Disposition: A | Discharge status: Home / Self Care | Payer: Self-pay | Source: Ambulatory Visit | Attending: Physician Assistant | Admitting: Physician Assistant

## 2019-02-28 ENCOUNTER — Other Ambulatory Visit: Payer: Self-pay

## 2019-02-28 ENCOUNTER — Encounter: Payer: Self-pay | Admitting: Physician Assistant

## 2019-02-28 ENCOUNTER — Ambulatory Visit: Payer: Self-pay | Admitting: Physician Assistant

## 2019-02-28 VITALS — BP 130/70 | HR 69 | Temp 97.3°F | Wt 168.6 lb

## 2019-02-28 DIAGNOSIS — Z789 Other specified health status: Secondary | ICD-10-CM

## 2019-02-28 DIAGNOSIS — I10 Essential (primary) hypertension: Secondary | ICD-10-CM

## 2019-02-28 DIAGNOSIS — Z124 Encounter for screening for malignant neoplasm of cervix: Secondary | ICD-10-CM | POA: Insufficient documentation

## 2019-02-28 DIAGNOSIS — D649 Anemia, unspecified: Secondary | ICD-10-CM

## 2019-02-28 DIAGNOSIS — E1165 Type 2 diabetes mellitus with hyperglycemia: Secondary | ICD-10-CM

## 2019-02-28 DIAGNOSIS — R928 Other abnormal and inconclusive findings on diagnostic imaging of breast: Secondary | ICD-10-CM

## 2019-02-28 DIAGNOSIS — E669 Obesity, unspecified: Secondary | ICD-10-CM

## 2019-02-28 DIAGNOSIS — E785 Hyperlipidemia, unspecified: Secondary | ICD-10-CM

## 2019-02-28 NOTE — Progress Notes (Signed)
BP 130/70   Pulse 69   Temp (!) 97.3 F (36.3 C)   Wt 168 lb 9.6 oz (76.5 kg)   LMP 02/15/2011 (Approximate)   SpO2 99%   BMI 36.48 kg/m    Subjective:    Patient ID: Amanda Zamora, female    DOB: 1960-10-23, 59 y.o.   MRN: [id number]  HPI: Amanda Zamora is a 59 y.o. female presenting on 02/28/2019 for Diabetes and Gynecologic Exam   HPI  Pt had a negative covid 19 screening questionnaire.   Pt is a 59yo F who presents to office today to follow up on DM and to update her PAP.  She says she is feeling well.  She is currently Using 55 and 35 units of novolin 70/30 insulin.  Reviewed bs log.  She says lowest 140ish.  No lows bs log- high 467. Low 103 (back in September) Pt is aware that her diabetes is uncontrolled.      Relevant past medical, surgical, family and social history reviewed and updated as indicated. Interim medical history since our last visit reviewed. Allergies and medications reviewed and updated.    Current Outpatient Medications:  .  atorvastatin (LIPITOR) 20 MG tablet, 1 po qd. Tome una tableta por boca diaria, Disp: 90 tablet, Rfl: 1 .  cyclobenzaprine (FLEXERIL) 10 MG tablet, 1 po q 8 hour prn.  Tome una tableta por boca cada 8 horas cuando sea necesario, Disp: 30 tablet, Rfl: 0 .  gabapentin (NEURONTIN) 300 MG capsule, Take 1 capsule by mouth twice daily, Disp: 60 capsule, Rfl: 0 .  insulin NPH-regular Human (NOVOLIN 70/30) (70-30) 100 UNIT/ML injection, Inject 50 units SQ before breakfast and 30 units before evening meal.   Injecte 50 unidades subcutaneamente dos veces al dia con el alimento y 30 unidades antes de el alimento de la tarde, Disp: 10 mL, Rfl: 11 .  losartan (COZAAR) 100 MG tablet, 1 po qd.  Tome una tableta por boca diaria, Disp: 30 tablet, Rfl: 3 .  metFORMIN (GLUCOPHAGE) 1000 MG tablet, Take 1 tablet (1,000 mg total) by mouth 2 (two) times daily with a meal. Tome una tableta por boca dos veces diarias con comida, Disp: 60  tablet, Rfl: 3 .  Omega-3 Fatty Acids (FISH OIL) 1000 MG CAPS, Take by mouth., Disp: , Rfl:  .  diclofenac (VOLTAREN) 75 MG EC tablet, 1 po bid with food.  Tome una tableta por boca dos veces diarias con comida (Patient not taking: Reported on 02/28/2019), Disp: 60 tablet, Rfl: 0   Review of Systems  Per HPI unless specifically indicated above     Objective:    BP 130/70   Pulse 69   Temp (!) 97.3 F (36.3 C)   Wt 168 lb 9.6 oz (76.5 kg)   LMP 02/15/2011 (Approximate)   SpO2 99%   BMI 36.48 kg/m   Wt Readings from Last 3 Encounters:  02/28/19 168 lb 9.6 oz (76.5 kg)  11/28/18 168 lb (76.2 kg)  08/01/18 168 lb 1.6 oz (76.2 kg)    Physical Exam Vitals and nursing note reviewed.  Constitutional:      General: She is not in acute distress.    Appearance: Normal appearance. She is well-developed. She is obese. She is not ill-appearing.  HENT:     Head: Normocephalic and atraumatic.  Cardiovascular:     Rate and Rhythm: Normal rate and regular rhythm.  Pulmonary:     Effort: Pulmonary effort is normal.  Breath sounds: Normal breath sounds.  Abdominal:     General: Bowel sounds are normal.     Palpations: Abdomen is soft. There is no mass.     Tenderness: There is no abdominal tenderness. There is no guarding or rebound.  Genitourinary:    Labia:        Right: No rash, tenderness or lesion.        Left: No rash, tenderness or lesion.      Vagina: Normal.     Cervix: No cervical motion tenderness, discharge or friability.     Adnexa:        Right: No mass, tenderness or fullness.         Left: No mass, tenderness or fullness.       Comments: (nurse Berenice assisted) Musculoskeletal:     Cervical back: Neck supple.     Right lower leg: No edema.     Left lower leg: No edema.  Lymphadenopathy:     Cervical: No cervical adenopathy.  Skin:    General: Skin is warm and dry.  Neurological:     Mental Status: She is alert and oriented to person, place, and time.   Psychiatric:        Behavior: Behavior normal.     Results for orders placed or performed during the hospital encounter of 02/22/19  CBC  Result Value Ref Range   WBC 5.8 4.0 - 10.5 K/uL   RBC 3.84 (L) 3.87 - 5.11 MIL/uL   Hemoglobin 11.4 (L) 12.0 - 15.0 g/dL   HCT 35.3 (L) 36.0 - 46.0 %   MCV 91.9 80.0 - 100.0 fL   MCH 29.7 26.0 - 34.0 pg   MCHC 32.3 30.0 - 36.0 g/dL   RDW 14.5 11.5 - 15.5 %   Platelets 269 150 - 400 K/uL   nRBC 0.0 0.0 - 0.2 %  Lipid panel  Result Value Ref Range   Cholesterol 221 (H) 0 - 200 mg/dL   Triglycerides 254 (H) <150 mg/dL   HDL 42 >40 mg/dL   Total CHOL/HDL Ratio 5.3 RATIO   VLDL 51 (H) 0 - 40 mg/dL   LDL Cholesterol 128 (H) 0 - 99 mg/dL  Comprehensive metabolic panel  Result Value Ref Range   Sodium 138 135 - 145 mmol/L   Potassium 4.4 3.5 - 5.1 mmol/L   Chloride 105 98 - 111 mmol/L   CO2 26 22 - 32 mmol/L   Glucose, Bld 168 (H) 70 - 99 mg/dL   BUN 16 6 - 20 mg/dL   Creatinine, Ser 0.77 0.44 - 1.00 mg/dL   Calcium 8.9 8.9 - 10.3 mg/dL   Total Protein 7.2 6.5 - 8.1 g/dL   Albumin 3.7 3.5 - 5.0 g/dL   AST 17 15 - 41 U/L   ALT 18 0 - 44 U/L   Alkaline Phosphatase 76 38 - 126 U/L   Total Bilirubin 0.6 0.3 - 1.2 mg/dL   GFR calc non Af Amer >60 >60 mL/min   GFR calc Af Amer >60 >60 mL/min   Anion gap 7 5 - 15  Hemoglobin A1c  Result Value Ref Range   Hgb A1c MFr Bld 8.8 (H) 4.8 - 5.6 %   Mean Plasma Glucose 205.86 mg/dL      Assessment & Plan:    Encounter Diagnoses  Name Primary?  . Routine Papanicolaou smear Yes  . Uncontrolled type 2 diabetes mellitus with hyperglycemia ([place])   . Hyperlipidemia, unspecified hyperlipidemia type   .  Essential hypertension   . Not proficient in [place] language   . Obesity, unspecified classification, unspecified obesity type, unspecified whether serious comorbidity present   . Abnormal mammogram   . Anemia, unspecified type        1. DM -reviewed labs with pt  -discussed diabetic diet  with pt and she thinks that discussing with dietician would be helpful.  Will Refer to RD for further teaching.  Will Increase insulin to 60 and 40 units.  She is to continue to monitor her blood sugars and she is reminded to call office for fbs < 70 or > 300.    Pt has diabetic eye exam on next week.  Her diabetic foot exam was updated today.  2. HTN Pt's bp is well controlled.  She is to continue current medications  3. Dyslipidemia Pt's lipids are up quite a bit from 3 months ago.  Pt to continue current medications and she is encouraged to follow lowfat diet and exercise regularly  4. HCM PAP updated today Colon cancer screening are  UTD  Pt due to have repeat diagnostic mammogram.  Will order.  Pt is to follow up in 87months.  She is to contact office sooner prn

## 2019-03-04 LAB — CYTOLOGY - PAP
Adequacy: ABSENT
Comment: NEGATIVE
Diagnosis: NEGATIVE
High risk HPV: NEGATIVE

## 2019-03-05 ENCOUNTER — Other Ambulatory Visit (HOSPITAL_COMMUNITY): Payer: Self-pay | Admitting: Physician Assistant

## 2019-03-05 DIAGNOSIS — Z09 Encounter for follow-up examination after completed treatment for conditions other than malignant neoplasm: Secondary | ICD-10-CM

## 2019-03-20 ENCOUNTER — Other Ambulatory Visit: Payer: Self-pay | Admitting: Physician Assistant

## 2019-03-28 ENCOUNTER — Other Ambulatory Visit (HOSPITAL_COMMUNITY): Payer: Self-pay | Admitting: Physician Assistant

## 2019-03-28 DIAGNOSIS — R921 Mammographic calcification found on diagnostic imaging of breast: Secondary | ICD-10-CM

## 2019-04-09 ENCOUNTER — Other Ambulatory Visit: Payer: Self-pay

## 2019-04-09 ENCOUNTER — Ambulatory Visit (HOSPITAL_COMMUNITY): Admission: RE | Admit: 2019-04-09 | Payer: Self-pay | Source: Ambulatory Visit

## 2019-04-09 ENCOUNTER — Ambulatory Visit (HOSPITAL_COMMUNITY)
Admission: RE | Admit: 2019-04-09 | Discharge: 2019-04-09 | Disposition: A | Discharge status: Home / Self Care | Payer: Self-pay | Source: Ambulatory Visit | Attending: Physician Assistant | Admitting: Physician Assistant

## 2019-04-09 DIAGNOSIS — Z09 Encounter for follow-up examination after completed treatment for conditions other than malignant neoplasm: Secondary | ICD-10-CM | POA: Insufficient documentation

## 2019-04-15 ENCOUNTER — Other Ambulatory Visit: Payer: Self-pay | Admitting: Physician Assistant

## 2019-04-16 ENCOUNTER — Other Ambulatory Visit: Payer: Self-pay | Admitting: Physician Assistant

## 2019-04-16 MED ORDER — DICLOFENAC SODIUM 75 MG PO TBEC: DELAYED_RELEASE_TABLET | ORAL | 0 refills | Status: DC

## 2019-05-14 ENCOUNTER — Other Ambulatory Visit: Payer: Self-pay | Admitting: Physician Assistant

## 2019-05-29 ENCOUNTER — Ambulatory Visit: Payer: Self-pay | Admitting: Physician Assistant

## 2019-06-05 ENCOUNTER — Other Ambulatory Visit: Payer: Self-pay | Admitting: Physician Assistant

## 2019-06-13 ENCOUNTER — Ambulatory Visit: Payer: Self-pay | Admitting: Physician Assistant

## 2019-06-17 ENCOUNTER — Other Ambulatory Visit (HOSPITAL_COMMUNITY)
Admission: RE | Admit: 2019-06-17 | Discharge: 2019-06-17 | Disposition: A | Discharge status: Home / Self Care | Payer: Self-pay | Source: Ambulatory Visit | Attending: Physician Assistant | Admitting: Physician Assistant

## 2019-06-17 DIAGNOSIS — I10 Essential (primary) hypertension: Secondary | ICD-10-CM | POA: Insufficient documentation

## 2019-06-17 DIAGNOSIS — E785 Hyperlipidemia, unspecified: Secondary | ICD-10-CM | POA: Insufficient documentation

## 2019-06-17 DIAGNOSIS — E1165 Type 2 diabetes mellitus with hyperglycemia: Secondary | ICD-10-CM | POA: Insufficient documentation

## 2019-06-17 LAB — COMPREHENSIVE METABOLIC PANEL
ALT: 23 U/L (ref 0–44)
AST: 21 U/L (ref 15–41)
Albumin: 4.1 g/dL (ref 3.5–5.0)
Alkaline Phosphatase: 79 U/L (ref 38–126)
Anion gap: 12 (ref 5–15)
BUN: 24 mg/dL — ABNORMAL HIGH (ref 6–20)
CO2: 25 mmol/L (ref 22–32)
Calcium: 9.2 mg/dL (ref 8.9–10.3)
Chloride: 98 mmol/L (ref 98–111)
Creatinine, Ser: 0.96 mg/dL (ref 0.44–1.00)
GFR calc Af Amer: >60 mL/min (ref >60)
GFR calc non Af Amer: >60 mL/min (ref >60)
Glucose, Bld: 172 mg/dL — ABNORMAL HIGH (ref 70–99)
Potassium: 4.9 mmol/L (ref 3.5–5.1)
Sodium: 135 mmol/L (ref 135–145)
Total Bilirubin: 0.8 mg/dL (ref 0.3–1.2)
Total Protein: 7.6 g/dL (ref 6.5–8.1)

## 2019-06-17 LAB — HEMOGLOBIN A1C
Hgb A1c MFr Bld: 8 % — ABNORMAL HIGH (ref 4.8–5.6)
Mean Plasma Glucose: 182.9 mg/dL

## 2019-06-17 LAB — LIPID PANEL
Cholesterol: 207 mg/dL — ABNORMAL HIGH (ref 0–200)
HDL: 44 mg/dL (ref >40)
LDL Cholesterol: 108 mg/dL — ABNORMAL HIGH (ref 0–99)
Total CHOL/HDL Ratio: 4.7 RATIO
Triglycerides: 276 mg/dL — ABNORMAL HIGH (ref <150)
VLDL: 55 mg/dL — ABNORMAL HIGH (ref 0–40)

## 2019-06-18 ENCOUNTER — Ambulatory Visit: Payer: Self-pay | Admitting: Physician Assistant

## 2019-06-23 ENCOUNTER — Other Ambulatory Visit: Payer: Self-pay | Admitting: Physician Assistant

## 2019-06-26 ENCOUNTER — Encounter: Payer: Self-pay | Admitting: Physician Assistant

## 2019-06-26 ENCOUNTER — Ambulatory Visit: Payer: Self-pay | Admitting: Physician Assistant

## 2019-06-26 DIAGNOSIS — E669 Obesity, unspecified: Secondary | ICD-10-CM

## 2019-06-26 DIAGNOSIS — I10 Essential (primary) hypertension: Secondary | ICD-10-CM

## 2019-06-26 DIAGNOSIS — Z789 Other specified health status: Secondary | ICD-10-CM

## 2019-06-26 DIAGNOSIS — E1165 Type 2 diabetes mellitus with hyperglycemia: Secondary | ICD-10-CM

## 2019-06-26 DIAGNOSIS — R928 Other abnormal and inconclusive findings on diagnostic imaging of breast: Secondary | ICD-10-CM

## 2019-06-26 DIAGNOSIS — E785 Hyperlipidemia, unspecified: Secondary | ICD-10-CM

## 2019-06-26 MED ORDER — ATORVASTATIN CALCIUM 40 MG PO TABS: ORAL_TABLET | ORAL | 4 refills | Status: DC

## 2019-06-26 NOTE — Progress Notes (Signed)
There were no vitals taken for this visit.   Subjective:    Patient ID: Amanda Zamora, female    DOB: 08/10/1960, 59 y.o.   MRN: [id number]  HPI: Amanda Zamora is a 59 y.o. female presenting on 06/26/2019 for No chief complaint on file.   HPI   This is a telemedicine appointment through [employer] due to coronavirus pandemic.  I connected with  Arielle Armacost on 06/26/19 by a video enabled telemedicine application and verified that I am speaking with the correct person using two identifiers.   I discussed the limitations of evaluation and management by telemedicine. The patient expressed understanding and agreed to proceed.  Pt is at home.  Provider and translator are at office.    Pt is a 87yoF with DM, HTN and dyslipidemia.  Pt had recent eye surgery at [facility].  She is still using some eye drops from the eye dr.   She monitors at home and says bs and bp are good  She says she is doing well and her only issues currently are with her recent eye surgery.      Relevant past medical, surgical, family and social history reviewed and updated as indicated. Interim medical history since our last visit reviewed. Allergies and medications reviewed and updated.   Current Outpatient Medications:  .  atorvastatin (LIPITOR) 20 MG tablet, 1 po qd. Tome una tableta por boca diaria, Disp: 90 tablet, Rfl: 1 .  diclofenac (VOLTAREN) 75 MG EC tablet, 1 po bid with food.  Tome una tableta por boca dos veces diarias con comida, Disp: 60 tablet, Rfl: 0 .  gabapentin (NEURONTIN) 300 MG capsule, Take 1 capsule by mouth twice daily, Disp: 60 capsule, Rfl: 0 .  insulin NPH-regular Human (NOVOLIN 70/30) (70-30) 100 UNIT/ML injection, Inject 50 units SQ before breakfast and 30 units before evening meal.   Injecte 50 unidades subcutaneamente dos veces al dia con el alimento y 30 unidades antes de el alimento de la tarde, Disp: 10 mL, Rfl: 11 .  losartan (COZAAR) 100 MG tablet, Take 1 tablet by mouth  once daily, Disp: 30 tablet, Rfl: 1 .  metFORMIN (GLUCOPHAGE) 1000 MG tablet, TAKE 1 TABLET BY MOUTH TWICE DAILY WITH A MEAL, Disp: 60 tablet, Rfl: 0 .  Omega-3 Fatty Acids (FISH OIL) 1000 MG CAPS, Take by mouth., Disp: , Rfl:  .  cyclobenzaprine (FLEXERIL) 10 MG tablet, 1 po q 8 hour prn.  Tome una tableta por boca cada 8 horas cuando sea necesario (Patient not taking: Reported on 06/26/2019), Disp: 30 tablet, Rfl: 0     Review of Systems  Per HPI unless specifically indicated above     Objective:    There were no vitals taken for this visit.  Wt Readings from Last 3 Encounters:  02/28/19 168 lb 9.6 oz (76.5 kg)  11/28/18 168 lb (76.2 kg)  08/01/18 168 lb 1.6 oz (76.2 kg)    Physical Exam Constitutional:      General: She is not in acute distress.    Appearance: She is not ill-appearing.  HENT:     Head: Normocephalic and atraumatic.  Eyes:     Comments: There is some puffiness around her eye where she had her recent surgery.  There is no redness or indication of infection.  Pulmonary:     Effort: No respiratory distress.  Neurological:     Mental Status: She is alert and oriented to person, place, and time.  Psychiatric:  Attention and Perception: Attention normal.        Mood and Affect: Mood normal.        Speech: Speech normal.        Behavior: Behavior normal. Behavior is cooperative.     Results for orders placed or performed during the hospital encounter of 06/17/19  Lipid panel  Result Value Ref Range   Cholesterol 207 (H) 0 - 200 mg/dL   Triglycerides 276 (H) <150 mg/dL   HDL 44 >40 mg/dL   Total CHOL/HDL Ratio 4.7 RATIO   VLDL 55 (H) 0 - 40 mg/dL   LDL Cholesterol 108 (H) 0 - 99 mg/dL  Comprehensive metabolic panel  Result Value Ref Range   Sodium 135 135 - 145 mmol/L   Potassium 4.9 3.5 - 5.1 mmol/L   Chloride 98 98 - 111 mmol/L   CO2 25 22 - 32 mmol/L   Glucose, Bld 172 (H) 70 - 99 mg/dL   BUN 24 (H) 6 - 20 mg/dL   Creatinine, Ser 0.96 0.44  - 1.00 mg/dL   Calcium 9.2 8.9 - 10.3 mg/dL   Total Protein 7.6 6.5 - 8.1 g/dL   Albumin 4.1 3.5 - 5.0 g/dL   AST 21 15 - 41 U/L   ALT 23 0 - 44 U/L   Alkaline Phosphatase 79 38 - 126 U/L   Total Bilirubin 0.8 0.3 - 1.2 mg/dL   GFR calc non Af Amer >60 >60 mL/min   GFR calc Af Amer >60 >60 mL/min   Anion gap 12 5 - 15  Hemoglobin A1c  Result Value Ref Range   Hgb A1c MFr Bld 8.0 (H) 4.8 - 5.6 %   Mean Plasma Glucose 182.9 mg/dL      Assessment & Plan:   Encounter Diagnoses  Name Primary?  Marland Kitchen Uncontrolled type 2 diabetes mellitus with hyperglycemia ([place]) Yes  . Essential hypertension   . Hyperlipidemia, unspecified hyperlipidemia type   . Obesity, unspecified classification, unspecified obesity type, unspecified whether serious comorbidity present   . Not proficient in [place] language   . Abnormal mammogram      -reviewed labs with pt  -will Increase atorvastatin and fish oil.  Pt to continue lowfat diet. -pt counseled to Watch DM diet and will continue current insulin -pt is Due for diagnostic mammoram in June -pt to follow up 3 months.  Se is to contact office sooner prn

## 2019-07-18 ENCOUNTER — Encounter: Payer: Self-pay | Admitting: Physician Assistant

## 2019-07-19 ENCOUNTER — Other Ambulatory Visit: Payer: Self-pay | Admitting: Physician Assistant

## 2019-08-08 ENCOUNTER — Other Ambulatory Visit: Payer: Self-pay | Admitting: Student

## 2019-08-13 ENCOUNTER — Other Ambulatory Visit (HOSPITAL_COMMUNITY): Payer: Self-pay | Admitting: Physician Assistant

## 2019-08-13 DIAGNOSIS — R928 Other abnormal and inconclusive findings on diagnostic imaging of breast: Secondary | ICD-10-CM

## 2019-08-22 ENCOUNTER — Other Ambulatory Visit: Payer: Self-pay | Admitting: Physician Assistant

## 2019-09-10 ENCOUNTER — Ambulatory Visit (HOSPITAL_COMMUNITY)
Admission: RE | Admit: 2019-09-10 | Discharge: 2019-09-10 | Disposition: A | Discharge status: Home / Self Care | Payer: Self-pay | Source: Ambulatory Visit | Attending: Physician Assistant | Admitting: Physician Assistant

## 2019-09-10 ENCOUNTER — Other Ambulatory Visit: Payer: Self-pay

## 2019-09-10 ENCOUNTER — Other Ambulatory Visit (HOSPITAL_COMMUNITY): Payer: Self-pay

## 2019-09-10 DIAGNOSIS — R928 Other abnormal and inconclusive findings on diagnostic imaging of breast: Secondary | ICD-10-CM

## 2019-09-26 ENCOUNTER — Other Ambulatory Visit (HOSPITAL_COMMUNITY)
Admission: RE | Admit: 2019-09-26 | Discharge: 2019-09-26 | Disposition: A | Discharge status: Home / Self Care | Payer: Self-pay | Source: Ambulatory Visit | Attending: Physician Assistant | Admitting: Physician Assistant

## 2019-09-26 ENCOUNTER — Other Ambulatory Visit: Payer: Self-pay

## 2019-09-26 DIAGNOSIS — E1165 Type 2 diabetes mellitus with hyperglycemia: Secondary | ICD-10-CM | POA: Insufficient documentation

## 2019-09-26 DIAGNOSIS — E785 Hyperlipidemia, unspecified: Secondary | ICD-10-CM | POA: Insufficient documentation

## 2019-09-26 DIAGNOSIS — I10 Essential (primary) hypertension: Secondary | ICD-10-CM | POA: Insufficient documentation

## 2019-09-26 LAB — COMPREHENSIVE METABOLIC PANEL
ALT: 18 U/L (ref 0–44)
AST: 16 U/L (ref 15–41)
Albumin: 3.9 g/dL (ref 3.5–5.0)
Alkaline Phosphatase: 65 U/L (ref 38–126)
Anion gap: 8 (ref 5–15)
BUN: 19 mg/dL (ref 6–20)
CO2: 28 mmol/L (ref 22–32)
Calcium: 9.1 mg/dL (ref 8.9–10.3)
Chloride: 100 mmol/L (ref 98–111)
Creatinine, Ser: 0.81 mg/dL (ref 0.44–1.00)
GFR calc Af Amer: >60 mL/min (ref >60)
GFR calc non Af Amer: >60 mL/min (ref >60)
Glucose, Bld: 149 mg/dL — ABNORMAL HIGH (ref 70–99)
Potassium: 4.6 mmol/L (ref 3.5–5.1)
Sodium: 136 mmol/L (ref 135–145)
Total Bilirubin: 0.7 mg/dL (ref 0.3–1.2)
Total Protein: 7.6 g/dL (ref 6.5–8.1)

## 2019-09-26 LAB — LIPID PANEL
Cholesterol: 225 mg/dL — ABNORMAL HIGH (ref 0–200)
HDL: 41 mg/dL (ref >40)
LDL Cholesterol: 135 mg/dL — ABNORMAL HIGH (ref 0–99)
Total CHOL/HDL Ratio: 5.5 RATIO
Triglycerides: 246 mg/dL — ABNORMAL HIGH (ref <150)
VLDL: 49 mg/dL — ABNORMAL HIGH (ref 0–40)

## 2019-09-26 LAB — HEMOGLOBIN A1C
Hgb A1c MFr Bld: 8 % — ABNORMAL HIGH (ref 4.8–5.6)
Mean Plasma Glucose: 182.9 mg/dL

## 2019-09-27 LAB — MICROALBUMIN, URINE: Microalb, Ur: 12.9 ug/mL — ABNORMAL HIGH

## 2019-09-30 ENCOUNTER — Encounter: Payer: Self-pay | Admitting: Physician Assistant

## 2019-09-30 ENCOUNTER — Ambulatory Visit: Payer: Self-pay | Admitting: Physician Assistant

## 2019-09-30 VITALS — BP 126/70 | HR 67 | Temp 97.3°F | Ht 60.75 in | Wt 165.0 lb

## 2019-09-30 DIAGNOSIS — E11319 Type 2 diabetes mellitus with unspecified diabetic retinopathy without macular edema: Secondary | ICD-10-CM

## 2019-09-30 DIAGNOSIS — Z789 Other specified health status: Secondary | ICD-10-CM

## 2019-09-30 DIAGNOSIS — E1165 Type 2 diabetes mellitus with hyperglycemia: Secondary | ICD-10-CM

## 2019-09-30 DIAGNOSIS — Z23 Encounter for immunization: Secondary | ICD-10-CM

## 2019-09-30 DIAGNOSIS — E669 Obesity, unspecified: Secondary | ICD-10-CM

## 2019-09-30 DIAGNOSIS — I10 Essential (primary) hypertension: Secondary | ICD-10-CM

## 2019-09-30 DIAGNOSIS — E785 Hyperlipidemia, unspecified: Secondary | ICD-10-CM

## 2019-09-30 NOTE — Patient Instructions (Signed)
https://www.cdc.gov/vaccines/hcp/vis/vis-statements/tdap.pdf">  °Vacuna Tdap (tétanos, difteria y tos ferina): lo que debe saber °Tdap (Tetanus, Diphtheria, Pertussis) Vaccine: What You Need to Know °1. ¿Por qué vacunarse? °La vacuna Tdap puede prevenir el tétanos, la difteria y la tos ferina. °La difteria y la tos ferina se contagian de persona a persona. El tétanos ingresa al organismo a través de cortes o heridas. °· El TÉTANOS (T) provoca rigidez dolorosa en los músculos. El tétanos puede causar graves problemas de salud, como no poder abrir la boca, tener dificultad para tragar y respirar, o la muerte. °· La DIFTERIA (D) puede causar dificultad para respirar, insuficiencia cardíaca, parálisis o muerte. °· La TOS FERINA (aP) también conocida como “tos convulsa” puede causar tos violenta e incontrolable lo que hace difícil respirar, comer o beber. La tos ferina puede ser muy grave en los bebés y en los niños pequeños, y causar neumonía, convulsiones, daño cerebral o la muerte. En adolescentes y adultos, puede causar pérdida de peso, pérdida del control de la vejiga, desmayos y fracturas de costillas al toser de manera intensa. °2. Vacuna Tdap °La vacuna Tdap es solo para niños de 7 años en adelante, adolescentes y adultos.  °Los adolescentes debe recibir una dosis única de la vacuna Tdap, preferentemente a los 11 o 12 años. °Las mujeres embarazadas deben recibir una dosis de la vacuna Tdap en cada embarazo para proteger al recién nacido de la tos ferina. Los bebés tienen mayor riesgo de sufrir complicaciones graves y potencialmente mortales debido a la tos ferina. °Los adultos que nunca recibieron la vacuna Tdap deben recibir una dosis. °Además, los adultos deben recibir una dosis de refuerzo cada 10 años, o antes si la persona sufre una quemadura o una herida grave y sucia. Las dosis de refuerzo pueden ser de la vacuna Tdap o la Td (una vacuna diferente que protege contra el tétanos y la difteria, pero no contra  la tos ferina). °La vacuna Tdap puede ser administrada al mismo tiempo que otras vacunas. °3. Hable con el médico °Comuníquese con la persona que le coloca las vacunas si la persona que la recibe: °· Ha tenido una reacción alérgica después de una dosis anterior de cualquier vacuna contra el tétanos, la difteria o la tos ferina, o cualquier alergia grave, potencialmente mortal. °· Ha tenido un coma, disminución del nivel de la conciencia o convulsiones prolongadas dentro de los 7 días posteriores a una dosis anterior de cualquier vacuna contra la tos ferina (DTP, DTaP o Tdap). °· Tiene convulsiones u otro problema del sistema nervioso. °· Alguna vez tuvo síndrome de Guillain-Barré (también llamado SGB). °· Ha tenido dolor intenso o hinchazón después de una dosis anterior de cualquier vacuna contra el tétanos o la difteria. °En algunos casos, es posible que el médico decida posponer la aplicación de la vacuna Tdap para una visita en el futuro.  °Las personas que sufren trastornos menores, como un resfrío, pueden vacunarse. Las personas que tienen enfermedades moderadas o graves generalmente deben esperar hasta recuperarse para poder recibir la vacuna Tdap.  °Su médico puede darle más información. °4. Riesgos de una reacción a la vacuna °· Después de recibir la vacuna Tdap a veces se puede tener dolor, enrojecimiento o hinchazón en el lugar donde se aplicó la inyección, fiebre leve, dolor de cabeza, sensación de cansancio y náuseas, vómitos, diarrea o dolor de estómago. °Las personas a veces se desmayan después de procedimientos médicos, incluida la vacunación. Informe al médico si se siente mareado, tiene cambios en la visión o zumbidos   en los oídos.  °Al igual que con cualquier medicamento, existe una probabilidad muy remota de que una vacuna cause una reacción alérgica grave, otra lesión grave o la muerte. °5. ¿Qué pasa si se presenta un problema grave? °Podría producirse una reacción alérgica después de que la  persona vacunada abandone la clínica. Si observa signos de una reacción alérgica grave (ronchas, hinchazón de la cara y la garganta, dificultad para respirar, latidos cardíacos acelerados, mareos o debilidad), llame al 9-1-1 y lleve a la persona al hospital más cercano. °Si se presentan otros signos que le preocupan, comuníquese con su médico.  °Las reacciones adversas deben informarse al Sistema de Informe de Eventos Adversos de Vacunas (Vaccine Adverse Event Reporting System, VAERS). Por lo general, el médico presenta este informe o puede hacerlo usted mismo. Visite el sitio web del VAERS en www.vaers.hhs.gov o llame al 1-800-822-7967. El VAERS es solo para informar reacciones; su personal no proporciona asesoramiento médico. °6. Programa Nacional de Compensación de Daños por Vacunas °El Programa Nacional de Compensación de Daños por Vacunas (National Vaccine Injury Compensation Program, VICP) es un programa federal que fue creado para compensar a las personas que puedan haber sufrido daños al recibir ciertas vacunas. Visite el sitio web del VICP en www.hrsa.gov/vaccinecompensation o llame al 1-800-338-2382 para obtener más información acerca del programa y de cómo presentar un reclamo. Hay un límite de tiempo para presentar un reclamo de compensación. °7. ¿Cómo puedo obtener más información? °· Pregúntele a su médico. °· Comuníquese con el servicio de salud de su localidad o su estado. °· Comuníquese con los Centers for Disease Control and Prevention, CDC (Centros para el Control y la Prevención de Enfermedades): °? Llame al 1-800-232-4636 (1-800-CDC-INFO) o °? Visite el sitio web de los CDC en www.cdc.gov/vaccines °Declaración de información de la vacuna Tdap (tétanos, difteria y tos ferina) (02/17/2018) °Esta información no tiene como fin reemplazar el consejo del médico. Asegúrese de hacerle al médico cualquier pregunta que tenga. °Document Revised: 06/12/2018 Document Reviewed: 06/12/2018 °Elsevier Patient  Education © 2020 [employer] Inc. ° ° °

## 2019-09-30 NOTE — Progress Notes (Signed)
BP 126/70    Pulse 67    Temp (!) 97.3 F (36.3 C)    Ht 5' 0.75" (1.543 m)    Wt 165 lb (74.8 kg)    SpO2 98%    BMI 31.43 kg/m    Subjective:    Patient ID: Amanda Zamora, female    DOB: Aug 25, 1960, 59 y.o.   MRN: [id number]  HPI: Amanda Zamora is a 58 y.o. female presenting on 09/30/2019 for Diabetes, Hyperlipidemia, and Hypertension   HPI   Pt had a negative covid 19 screening questionnaire.    Pt is 60yoF with DM, HTN, dyslipidemia.    She is Using insulin 50 am 30 evening.  She Checks her bs sometimes.  Most recent check it was 142.  She has No lows at night since cutting back evening dose of insulin.  Pt works at a [employer].  She is Having eye surgery again next week    Relevant past medical, surgical, family and social history reviewed and updated as indicated. Interim medical history since our last visit reviewed. Allergies and medications reviewed and updated.   Current Outpatient Medications:    atorvastatin (LIPITOR) 40 MG tablet, Tome una tableta por boca diaria, Disp: 30 tablet, Rfl: 4   cyclobenzaprine (FLEXERIL) 10 MG tablet, 1 po q 8 hour prn.  Tome una tableta por boca cada 8 horas cuando sea necesario, Disp: 30 tablet, Rfl: 0   diclofenac (VOLTAREN) 75 MG EC tablet, 1 po bid with food.  Tome una tableta por boca dos veces diarias con comida, Disp: 60 tablet, Rfl: 0   gabapentin (NEURONTIN) 300 MG capsule, Take 1 capsule by mouth twice daily, Disp: 60 capsule, Rfl: 2   insulin NPH-regular Human (NOVOLIN 70/30) (70-30) 100 UNIT/ML injection, Inject 50 units SQ before breakfast and 30 units before evening meal.   Injecte 50 unidades subcutaneamente dos veces al dia con el alimento y 30 unidades antes de el alimento de la tarde (Patient taking differently: Inject 50 units SQ before breakfast and 30-50 units before evening meal.   Injecte 50 unidades subcutaneamente dos veces al dia con el alimento y 30-50 unidades antes de el alimento de la  tarde), Disp: 10 mL, Rfl: 11   losartan (COZAAR) 100 MG tablet, Take 1 tablet by mouth once daily, Disp: 30 tablet, Rfl: 1   metFORMIN (GLUCOPHAGE) 1000 MG tablet, TAKE 1 TABLET BY MOUTH TWICE DAILY WITH A MEAL, Disp: 60 tablet, Rfl: 2   Omega-3 Fatty Acids (FISH OIL) 1000 MG CAPS, Take by mouth., Disp: , Rfl:     Review of Systems  Per HPI unless specifically indicated above     Objective:    BP 126/70    Pulse 67    Temp (!) 97.3 F (36.3 C)    Ht 5' 0.75" (1.543 m)    Wt 165 lb (74.8 kg)    SpO2 98%    BMI 31.43 kg/m   Wt Readings from Last 3 Encounters:  09/30/19 165 lb (74.8 kg)  02/28/19 168 lb 9.6 oz (76.5 kg)  11/28/18 168 lb (76.2 kg)    Physical Exam Vitals reviewed.  Constitutional:      General: She is not in acute distress.    Appearance: She is well-developed. She is not ill-appearing.  HENT:     Head: Normocephalic and atraumatic.  Cardiovascular:     Rate and Rhythm: Normal rate and regular rhythm.  Pulmonary:     Effort: Pulmonary effort is normal.  Breath sounds: Normal breath sounds.  Abdominal:     General: Bowel sounds are normal.     Palpations: Abdomen is soft. There is no mass.     Tenderness: There is no abdominal tenderness.  Musculoskeletal:     Cervical back: Neck supple.     Right lower leg: No edema.     Left lower leg: No edema.  Lymphadenopathy:     Cervical: No cervical adenopathy.  Skin:    General: Skin is warm and dry.  Neurological:     Mental Status: She is alert and oriented to person, place, and time.  Psychiatric:        Behavior: Behavior normal.     Results for orders placed or performed during the hospital encounter of 09/26/19  Hemoglobin A1c  Result Value Ref Range   Hgb A1c MFr Bld 8.0 (H) 4.8 - 5.6 %   Mean Plasma Glucose 182.9 mg/dL  Microalbumin, urine  Result Value Ref Range   Microalb, Ur 12.9 (H) Not Estab. ug/mL  Lipid panel  Result Value Ref Range   Cholesterol 225 (H) 0 - 200 mg/dL    Triglycerides 246 (H) <150 mg/dL   HDL 41 >40 mg/dL   Total CHOL/HDL Ratio 5.5 RATIO   VLDL 49 (H) 0 - 40 mg/dL   LDL Cholesterol 135 (H) 0 - 99 mg/dL  Comprehensive metabolic panel  Result Value Ref Range   Sodium 136 135 - 145 mmol/L   Potassium 4.6 3.5 - 5.1 mmol/L   Chloride 100 98 - 111 mmol/L   CO2 28 22 - 32 mmol/L   Glucose, Bld 149 (H) 70 - 99 mg/dL   BUN 19 6 - 20 mg/dL   Creatinine, Ser 0.81 0.44 - 1.00 mg/dL   Calcium 9.1 8.9 - 10.3 mg/dL   Total Protein 7.6 6.5 - 8.1 g/dL   Albumin 3.9 3.5 - 5.0 g/dL   AST 16 15 - 41 U/L   ALT 18 0 - 44 U/L   Alkaline Phosphatase 65 38 - 126 U/L   Total Bilirubin 0.7 0.3 - 1.2 mg/dL   GFR calc non Af Amer >60 >60 mL/min   GFR calc Af Amer >60 >60 mL/min   Anion gap 8 5 - 15      Assessment & Plan:   Encounter Diagnoses  Name Primary?   Uncontrolled type 2 diabetes mellitus with hyperglycemia ([place]) Yes   Essential hypertension    Hyperlipidemia, unspecified hyperlipidemia type    Obesity, unspecified classification, unspecified obesity type, unspecified whether serious comorbidity present    Not proficient in English language    Diabetic retinopathy associated with type 2 diabetes mellitus, macular edema presence unspecified, unspecified laterality, unspecified retinopathy severity ([place])    Need for Tdap vaccination      -reviewed labs with pt -pt to Continue cuureent medicatios -pt counseled to watch lowfat diet and DM diet -Updated Tdap -pt to follow up 3 months.  She is to contact office sooner prn

## 2019-10-02 ENCOUNTER — Other Ambulatory Visit: Payer: Self-pay | Admitting: Physician Assistant

## 2019-10-02 DIAGNOSIS — Z1211 Encounter for screening for malignant neoplasm of colon: Secondary | ICD-10-CM

## 2019-10-02 LAB — IFOBT (OCCULT BLOOD): IFOBT: NEGATIVE

## 2019-10-10 ENCOUNTER — Other Ambulatory Visit: Payer: Self-pay | Admitting: Physician Assistant

## 2019-10-10 MED ORDER — DICLOFENAC SODIUM 75 MG PO TBEC: DELAYED_RELEASE_TABLET | ORAL | 0 refills | Status: DC

## 2019-10-24 ENCOUNTER — Other Ambulatory Visit: Payer: Self-pay | Admitting: Physician Assistant

## 2019-11-21 ENCOUNTER — Other Ambulatory Visit: Payer: Self-pay | Admitting: Physician Assistant

## 2019-12-03 ENCOUNTER — Encounter (HOSPITAL_COMMUNITY): Payer: Self-pay | Admitting: *Deleted

## 2019-12-03 ENCOUNTER — Other Ambulatory Visit: Payer: Self-pay

## 2019-12-03 DIAGNOSIS — E1140 Type 2 diabetes mellitus with diabetic neuropathy, unspecified: Secondary | ICD-10-CM | POA: Insufficient documentation

## 2019-12-03 DIAGNOSIS — Z7984 Long term (current) use of oral hypoglycemic drugs: Secondary | ICD-10-CM | POA: Insufficient documentation

## 2019-12-03 DIAGNOSIS — I10 Essential (primary) hypertension: Secondary | ICD-10-CM | POA: Insufficient documentation

## 2019-12-03 DIAGNOSIS — R1084 Generalized abdominal pain: Secondary | ICD-10-CM | POA: Insufficient documentation

## 2019-12-03 DIAGNOSIS — Z794 Long term (current) use of insulin: Secondary | ICD-10-CM | POA: Insufficient documentation

## 2019-12-03 DIAGNOSIS — R110 Nausea: Secondary | ICD-10-CM | POA: Insufficient documentation

## 2019-12-03 DIAGNOSIS — Z79899 Other long term (current) drug therapy: Secondary | ICD-10-CM | POA: Insufficient documentation

## 2019-12-03 LAB — CBC WITH DIFFERENTIAL/PLATELET
Abs Immature Granulocytes: 0.03 10*3/uL (ref 0.00–0.07)
Basophils Absolute: 0 10*3/uL (ref 0.0–0.1)
Basophils Relative: 1 %
Eosinophils Absolute: 0.3 10*3/uL (ref 0.0–0.5)
Eosinophils Relative: 4 %
HCT: 36.3 % (ref 36.0–46.0)
Hemoglobin: 11.8 g/dL — ABNORMAL LOW (ref 12.0–15.0)
Immature Granulocytes: 0 %
Lymphocytes Relative: 27 %
Lymphs Abs: 2.2 10*3/uL (ref 0.7–4.0)
MCH: 30.5 pg (ref 26.0–34.0)
MCHC: 32.5 g/dL (ref 30.0–36.0)
MCV: 93.8 fL (ref 80.0–100.0)
Monocytes Absolute: 0.5 10*3/uL (ref 0.1–1.0)
Monocytes Relative: 7 %
Neutro Abs: 5.2 10*3/uL (ref 1.7–7.7)
Neutrophils Relative %: 61 %
Platelets: 293 10*3/uL (ref 150–400)
RBC: 3.87 MIL/uL (ref 3.87–5.11)
RDW: 13.9 % (ref 11.5–15.5)
WBC: 8.3 10*3/uL (ref 4.0–10.5)
nRBC: 0 % (ref 0.0–0.2)

## 2019-12-03 LAB — URINALYSIS, MICROSCOPIC (REFLEX): RBC / HPF: NONE SEEN RBC/hpf (ref 0–5)

## 2019-12-03 LAB — URINALYSIS, ROUTINE W REFLEX MICROSCOPIC
Bilirubin Urine: NEGATIVE
Glucose, UA: NEGATIVE mg/dL
Hgb urine dipstick: NEGATIVE
Ketones, ur: NEGATIVE mg/dL
Nitrite: NEGATIVE
Protein, ur: NEGATIVE mg/dL
Specific Gravity, Urine: 1.02 (ref 1.005–1.030)
pH: 7.5 (ref 5.0–8.0)

## 2019-12-03 LAB — BASIC METABOLIC PANEL
Anion gap: 11 (ref 5–15)
BUN: 16 mg/dL (ref 6–20)
CO2: 26 mmol/L (ref 22–32)
Calcium: 9.6 mg/dL (ref 8.9–10.3)
Chloride: 102 mmol/L (ref 98–111)
Creatinine, Ser: 0.89 mg/dL (ref 0.44–1.00)
GFR, Estimated: >60 mL/min (ref >60)
Glucose, Bld: 108 mg/dL — ABNORMAL HIGH (ref 70–99)
Potassium: 4 mmol/L (ref 3.5–5.1)
Sodium: 139 mmol/L (ref 135–145)

## 2019-12-03 NOTE — ED Triage Notes (Signed)
Pt with right sided abd pain that radiates around to back. + nausea and diarrhea, but denies emesis. Pt denies any burning with urination.

## 2019-12-04 ENCOUNTER — Emergency Department (HOSPITAL_COMMUNITY): Payer: Self-pay

## 2019-12-04 ENCOUNTER — Emergency Department (HOSPITAL_COMMUNITY)
Admission: EM | Admit: 2019-12-04 | Discharge: 2019-12-04 | Disposition: A | Discharge status: Home / Self Care | Payer: Self-pay | Attending: Emergency Medicine | Admitting: Emergency Medicine

## 2019-12-04 DIAGNOSIS — R1084 Generalized abdominal pain: Secondary | ICD-10-CM

## 2019-12-04 DIAGNOSIS — R1011 Right upper quadrant pain: Secondary | ICD-10-CM

## 2019-12-04 HISTORY — DX: Serous retinal detachment, unspecified eye: H33.20

## 2019-12-04 LAB — LIPASE, BLOOD: Lipase: 26 U/L (ref 11–51)

## 2019-12-04 LAB — HEPATIC FUNCTION PANEL
ALT: 28 U/L (ref 0–44)
AST: 24 U/L (ref 15–41)
Albumin: 4.1 g/dL (ref 3.5–5.0)
Alkaline Phosphatase: 75 U/L (ref 38–126)
Bilirubin, Direct: <0.1 mg/dL (ref 0.0–0.2)
Total Bilirubin: 0.5 mg/dL (ref 0.3–1.2)
Total Protein: 8.1 g/dL (ref 6.5–8.1)

## 2019-12-04 MED ORDER — OMEPRAZOLE 20 MG PO CPDR: 20.0000 mg | DELAYED_RELEASE_CAPSULE | Freq: Every day | ORAL | 1 refills | Status: DC

## 2019-12-04 MED ORDER — IOHEXOL 300 MG/ML  SOLN
100.0000 mL | Freq: Once | INTRAMUSCULAR | Status: AC | PRN
  Administered 2019-12-04: 100 mL via INTRAVENOUS

## 2019-12-04 MED ORDER — SODIUM CHLORIDE 0.9 % IV BOLUS
500.0000 mL | Freq: Once | INTRAVENOUS | Status: AC
  Administered 2019-12-04: 500 mL via INTRAVENOUS

## 2019-12-04 MED ORDER — ONDANSETRON HCL 4 MG/2ML IJ SOLN
4.0000 mg | Freq: Once | INTRAMUSCULAR | Status: AC
  Administered 2019-12-04: 4 mg via INTRAVENOUS
  Filled 2019-12-04: qty 2

## 2019-12-04 MED ORDER — ALUM & MAG HYDROXIDE-SIMETH 200-200-20 MG/5ML PO SUSP
30.0000 mL | Freq: Once | ORAL | Status: AC
  Administered 2019-12-04: 30 mL via ORAL
  Filled 2019-12-04: qty 30

## 2019-12-04 MED ORDER — MORPHINE SULFATE (PF) 4 MG/ML IV SOLN
4.0000 mg | Freq: Once | INTRAVENOUS | Status: AC
  Administered 2019-12-04: 4 mg via INTRAVENOUS
  Filled 2019-12-04: qty 1

## 2019-12-04 NOTE — ED Notes (Signed)
Patient returns from CT

## 2019-12-04 NOTE — Discharge Instructions (Addendum)
You were seen in the emergency department for abdominal pain.  Your lab work did not show any serious findings.  Your CAT scan also did not show an obvious explanation for your pain.  You were seen by Dr. Constance Haw general surgery and she wants you to see her tomorrow in the office for continued work-up of your symptoms.  We are prescribing you some acid medication.  Please return to the emergency department if any high fever or worsening symptoms.

## 2019-12-04 NOTE — ED Notes (Signed)
Pt states her abdominal pain is still present after Maalox

## 2019-12-04 NOTE — ED Provider Notes (Signed)
[facility] EMERGENCY DEPARTMENT Provider Note   CSN: [id number] Arrival date & time: 12/03/19  2106     History Chief Complaint  Patient presents with  . Abdominal Pain    Amanda Zamora is a 59 y.o. female.  59 year old female diabetic here with 2 weeks of on and off right-sided abdominal pain mostly right upper quadrant associated with some nausea.  Denies any urinary symptoms.  No known fevers.  Denies chest pain or shortness of breath.  No vomiting.  States bowels were normal.  No prior surgical history.  Rates the pain as severe  The history is provided by the patient.  Abdominal Pain Pain location:  RUQ Pain quality: aching   Pain radiates to:  Back Pain severity:  Severe Onset quality:  Gradual Duration:  2 weeks Timing:  Intermittent Progression:  Waxing and waning Chronicity:  New Context: not recent travel and not trauma   Relieved by:  Nothing Worsened by:  Nothing Ineffective treatments:  None tried Associated symptoms: nausea   Associated symptoms: no chest pain, no chills, no constipation, no cough, no dysuria, no fever, no hematemesis, no hematochezia, no hematuria, no shortness of breath, no sore throat and no vomiting        Past Medical History:  Diagnosis Date  . Detached retina   . GERD (gastroesophageal reflux disease)   . History of pyelonephritis   . Type 2 diabetes mellitus [facility])     Patient Active Problem List   Diagnosis Date Noted  . Abnormal mammogram 10/10/2018  . Type 2 diabetes mellitus with diabetic neuropathy, unspecified ([place]) 08/01/2018  . Not proficient in [place] language 08/01/2018  . Screening for colon cancer 08/01/2018  . Hyperlipidemia 03/10/2015  . HTN (hypertension) 03/10/2015  . Obesity, unspecified 03/10/2015  . Esophageal reflux 03/10/2015  . Precordial pain 03/28/2013  . Type 2 diabetes mellitus ([place]) 03/28/2013    Past Surgical History:  Procedure Laterality Date  . EYE SURGERY    . TUBAL LIGATION        OB History   No obstetric history on file.     Family History  Problem Relation Age of Onset  . Breast cancer Sister   . Diabetes Sister     Social History   Tobacco Use  . Smoking status: Never Smoker  . Smokeless tobacco: Never Used  Vaping Use  . Vaping Use: Never used  Substance Use Topics  . Alcohol use: Not Currently    Comment: previously would drink occ. beer last drink 06/2017  . Drug use: No    Home Medications Prior to Admission medications   Medication Sig Start Date End Date Taking? Authorizing Provider  atorvastatin (LIPITOR) 40 MG tablet Tome una tableta por boca diaria 06/26/19   Soyla Dryer, PA-C  cyclobenzaprine (FLEXERIL) 10 MG tablet 1 po q 8 hour prn.  Tome una tableta por boca cada 8 horas cuando sea necesario 11/28/18   Soyla Dryer, PA-C  diclofenac (VOLTAREN) 75 MG EC tablet 1 po bid with food.  Tome una tableta por boca dos veces diarias con comida 10/10/19   Soyla Dryer, PA-C  gabapentin (NEURONTIN) 300 MG capsule Take 1 capsule by mouth twice daily 07/20/19   Soyla Dryer, PA-C  insulin NPH-regular Human (NOVOLIN 70/30 RELION) (70-30) 100 UNIT/ML injection Inject 50 units SQ before breakfast and 30-50 units before evening meal.   Injecte 50 unidades subcutaneamente dos veces al dia con el alimento y 30-50 unidades antes de el alimento de la tarde  10/24/19   Soyla Dryer, PA-C  losartan (COZAAR) 100 MG tablet Take 1 tablet by mouth once daily 11/21/19   Soyla Dryer, PA-C  metFORMIN (GLUCOPHAGE) 1000 MG tablet TAKE 1 TABLET BY MOUTH TWICE DAILY WITH A MEAL 10/24/19   Soyla Dryer, PA-C  Omega-3 Fatty Acids (FISH OIL) 1000 MG CAPS Take by mouth.    [provider]    Allergies    Patient has no known allergies.  Review of Systems   Review of Systems  Constitutional: Negative for chills and fever.  HENT: Negative for sore throat.   Eyes: Negative for visual disturbance.  Respiratory: Negative for cough and  shortness of breath.   Cardiovascular: Negative for chest pain.  Gastrointestinal: Positive for abdominal pain and nausea. Negative for constipation, hematemesis, hematochezia and vomiting.  Genitourinary: Negative for dysuria and hematuria.  Musculoskeletal: Negative for neck pain.  Skin: Negative for rash.  Neurological: Negative for headaches.    Physical Exam Updated Vital Signs BP (!) 170/75   Pulse 76   Temp 98.3 F (36.8 C) (Oral)   Resp 20   Ht 5\' 2"  (1.575 m)   Wt 72.6 kg   SpO2 100%   BMI 29.26 kg/m   Physical Exam Vitals and nursing note reviewed.  Constitutional:      General: She is not in acute distress.    Appearance: She is well-developed. She is obese.  HENT:     Head: Normocephalic and atraumatic.  Eyes:     Conjunctiva/sclera: Conjunctivae normal.  Cardiovascular:     Rate and Rhythm: Normal rate and regular rhythm.     Heart sounds: No murmur heard.   Pulmonary:     Effort: Pulmonary effort is normal. No respiratory distress.     Breath sounds: Normal breath sounds.  Abdominal:     Palpations: Abdomen is soft.     Tenderness: There is abdominal tenderness in the right upper quadrant. There is no guarding or rebound.  Musculoskeletal:        General: No deformity or signs of injury. Normal range of motion.     Cervical back: Neck supple.  Skin:    General: Skin is warm and dry.     Capillary Refill: Capillary refill takes less than 2 seconds.  Neurological:     General: No focal deficit present.     Mental Status: She is alert.     ED Results / Procedures / Treatments   Labs (all labs ordered are listed, but only abnormal results are displayed) Labs Reviewed  URINALYSIS, ROUTINE W REFLEX MICROSCOPIC - Abnormal; Notable for the following components:      Result Value   Leukocytes,Ua SMALL (*)    All other components within normal limits  CBC WITH DIFFERENTIAL/PLATELET - Abnormal; Notable for the following components:   Hemoglobin 11.8  (*)    All other components within normal limits  BASIC METABOLIC PANEL - Abnormal; Notable for the following components:   Glucose, Bld 108 (*)    All other components within normal limits  URINALYSIS, MICROSCOPIC (REFLEX) - Abnormal; Notable for the following components:   Bacteria, UA RARE (*)    All other components within normal limits  HEPATIC FUNCTION PANEL  LIPASE, BLOOD    EKG None  Radiology CT Abdomen Pelvis W Contrast  Result Date: 12/04/2019 CLINICAL DATA:  Abdominal pain with nausea EXAM: CT ABDOMEN AND PELVIS WITH CONTRAST TECHNIQUE: Multidetector CT imaging of the abdomen and pelvis was performed using the standard protocol  following bolus administration of intravenous contrast. CONTRAST:  171mL OMNIPAQUE IOHEXOL 300 MG/ML  SOLN COMPARISON:  March 01, 2017 FINDINGS: Lower chest: There is mild scarring in the lung bases. There are foci of coronary artery calcification. Hepatobiliary: There is hepatic steatosis. No focal liver lesions are appreciable. The gallbladder wall is not appreciably thickened. There is no biliary duct dilatation. Pancreas: No pancreatic mass or inflammatory focus. Spleen: No splenic lesions are evident. Adrenals/Urinary Tract: Adrenals bilaterally appear unremarkable. There is a cyst arising from the posterior lower pole of the right kidney measuring 2.2 x 2.2 cm. There is a 7 x 7 mm cyst in the mid right kidney nearby. There is no appreciable hydronephrosis on either side. There is no renal or ureteral calculus on either side. Urinary bladder is midline with wall thickness within normal limits. Stomach/Bowel: There is no appreciable bowel wall or mesenteric thickening. Terminal ileum appears normal. There is no evident bowel obstruction. There is no free air or portal venous air. Vascular/Lymphatic: No abdominal aortic aneurysm. There are foci of aortic atherosclerosis. Major venous structures appear patent. There is no evident adenopathy in the abdomen or  pelvis. Reproductive: The uterus is midline and canted to the left. No evident adnexal mass. Other: The appendix measures 7 mm which is borderline prominent in its midportion. On axial and to a lesser degree sagittal images, there is suggestion of slight mesenteric thickening along the mid appendiceal region. This finding is not readily confirmed on coronal images. There is no fluid surrounding the appendix. This appearance is somewhat equivocal for earliest changes of acute appendiceal inflammation in the mid appendiceal region. No abscess or ascites evident in the abdomen pelvis. There is mild fat in the umbilicus. Musculoskeletal: There are foci of degenerative change in the lumbar spine. No blastic or lytic bone lesions. No intramuscular lesions. IMPRESSION: 1. Appendix is at the upper normal in size with questionable subtle mesenteric thickening in the mid appendiceal region which is appreciable on axial images and subtle on the sagittal images but not clearly seen on coronal images. Appendix otherwise appears normal. This appearance may indicate earliest changes of appendicitis. This appearance warrants close clinical assessment and assessment with respect to white blood cell count. Surgical consultation for assessment may be warranted. Depending on clinical assessment, short interval imaging surveillance may be warranted. Appendix: Location: Appendix arises medially from the cecum extending to just to the right of midline at the level of the upper right iliac crest. Diameter: 7 mm, upper normal Appendicolith: None Mucosal hyper-enhancement: None Extraluminal gas: None Periappendiceal collection: No fluid collection. Slight stranding along the mid appendix on axial and to a lesser extent sagittal images but not confirmed on coronal images. 2.  No bowel obstruction.  No abscess in the abdomen or pelvis. 3.  No evident renal or ureteral calculus.  No hydronephrosis. 4.  Hepatic steatosis. 5. Aortic  Atherosclerosis (ICD10-I70.0). There are foci of coronary artery calcification. Critical Value/emergent results were called by telephone at the time of interpretation on 12/04/2019 at 9:11 am to provider [facility] , who verbally acknowledged these results. Electronically Signed   By: Lowella Grip III M.D.   On: 12/04/2019 09:11   US Abdomen Limited RUQ (LIVER/GB)  Result Date: 12/04/2019 CLINICAL DATA:  Right upper quadrant abdominal pain EXAM: ULTRASOUND ABDOMEN LIMITED RIGHT UPPER QUADRANT COMPARISON:  CT 12/04/2019.  Ultrasound 04/11/2018 FINDINGS: Gallbladder: No gallstones or wall thickening visualized. No sonographic Murphy sign noted by sonographer. Common bile duct: Diameter: 3 mm. Liver:  No focal lesion identified. Diffusely increased hepatic parenchymal echogenicity. Portal vein is patent on color Doppler imaging with normal direction of blood flow towards the liver. Other: None. IMPRESSION: The echogenicity of the liver is increased. This is a nonspecific finding but is most commonly seen with fatty infiltration of the liver. There are no obvious focal liver lesions. Electronically Signed   By: Davina Poke D.O.   On: 12/04/2019 12:48    Procedures Procedures (including critical care time)  Medications Ordered in ED Medications  morphine 4 MG/ML injection 4 mg (has no administration in time range)  sodium chloride 0.9 % bolus 500 mL (has no administration in time range)  ondansetron (ZOFRAN) injection 4 mg (has no administration in time range)    ED Course  I have reviewed the triage vital signs and the nursing notes.  Pertinent labs & imaging results that were available during my care of the patient were reviewed by me and considered in my medical decision making (see chart for details).  Clinical Course as of Dec 03 1817  Wed Dec 04, 2019  1139 Reviewed results with Dr. Constance Haw general surgery.  She said she would come down and evaluate the patient and make a  decision.   [MB]  1236 Dr. Constance Haw seen the patient and she feels this is more likely gallbladder if anything.  She is ordered an ultrasound right upper quadrant will reevaluate after this was been done.   [MB]    Clinical Course User Index [MB] Hayden Rasmussen, MD   MDM Rules/Calculators/A&P                         This patient complains of generalized abdominal pain right greater than left; this involves an extensive number of treatment Options and is a complaint that carries with it a high risk of complications and Morbidity. The differential includes cholecystitis, cholelithiasis, appendicitis, constipation, obstruction, diverticulitis  I ordered, reviewed and interpreted labs, which included CBC with normal white count normal hemoglobin, chemistries normal other than mildly elevated glucose, LFTs and lipase normal, urinalysis equivocal I ordered medication IV fluids pain and nausea medication with improvement in her symptoms I ordered imaging studies which included CT abdomen and pelvis and right upper quadrant ultrasound and I independently    visualized and interpreted imaging which showed fatty liver, no evidence of cholecystitis or appendicitis  Previous records obtained and reviewed in epic, no recent admissions I consulted Dr. Constance Haw general surgery and discussed lab and imaging findings  Critical Interventions: None  After the interventions stated above, I reevaluated the patient and found patient symptoms to be improved.  Patient has been seen and cleared by Dr. Constance Haw to follow-up with her outpatient tomorrow.  Reviewed results with patient and she is comfortable plan for outpatient treatment.  Return instructions discussed.   Final Clinical Impression(s) / ED Diagnoses Final diagnoses:  Generalized abdominal pain    Rx / DC Orders ED Discharge Orders         Ordered    omeprazole (PRILOSEC) 20 MG capsule  Daily        12/04/19 1340           Hayden Rasmussen, MD 12/04/19 1821

## 2019-12-04 NOTE — Consult Note (Addendum)
I was present with the medical student for this service. I personally verified the history of present illness, performed the physical exam, and made the plan for this encounter. I have verified the medical student's documentation and made modifications where appropriately. I have personally documented in my own words a brief history, physical, and plan below.     Patient with 2 weeks of abdominal pain. Has constipation and also diarrhea. Most recently diarrhea. Says her pain is in the RUQ and is worse with fatty /greasy foods.   Personally reviewed CT  CT with appendix that is on the larger end of normal, no stranding Normal labs and no leukocytosis or shift  RUQ recommended and normal with fatty liver and no stones.  Soft, nondistended, RUQ tenderness, no RLQ tenderness  No acute appendicitis. Precautions given to return to ED if worsening pain or fever.  GI cocktail? Possible gastritis versus Possible GI bug versus some biliary dyskinesia.  Will see back in the office tomorrow to reassess. May need HIDA scan if continues to have issues.  Curlene Labrum, MD [facility] [address] [place], [place] [address] [phone] (office)   [facility] Consult  Reason for Consult: Concern for Appendicitis Referring Physician: ED  Chief Complaint    Abdominal Pain      HPI: Amanda Zamora is a 59 y.o. female with PMH DM, HTN, and dyslipidemia who presents with 2 weeks RUQ and epigastric abdominal pain with associated nausea and intermittent loose stools.  She reports a longstanding hx of constipation and for last 2 weeks has experienced loose stools up to 6 times/day alternating w/ days of no BMs. She describes an intense RUQ abdominal pain that comes in waves and is followed by a persistent burning sensation. Her pain is associated with eating, espicially greasy foods. Denies fever, chills, and urinary sx.  She reports minimal relief  w/ OTC gas medicine and no relief from Tylenol. Pt reports 4 months daily diclofenac use. Family hx significant for cholecystectomy in pt's daughter.    Past Medical History:  Diagnosis Date  . Detached retina   . GERD (gastroesophageal reflux disease)   . History of pyelonephritis   . Type 2 diabetes mellitus ([place])     Past Surgical History:  Procedure Laterality Date  . EYE SURGERY    . TUBAL LIGATION      Family History  Problem Relation Age of Onset  . Breast cancer Sister   . Diabetes Sister     Social History   Tobacco Use  . Smoking status: Never Smoker  . Smokeless tobacco: Never Used  Vaping Use  . Vaping Use: Never used  Substance Use Topics  . Alcohol use: Not Currently    Comment: previously would drink occ. beer last drink 06/2017  . Drug use: No    Medications: I have reviewed the patient's current medications. No current facility-administered medications for this encounter.   Current Outpatient Medications  Medication Sig Dispense Refill Last Dose  . atorvastatin (LIPITOR) 40 MG tablet Tome una tableta por boca diaria 30 tablet 4   . cyclobenzaprine (FLEXERIL) 10 MG tablet 1 po q 8 hour prn.  Tome una tableta por boca cada 8 horas cuando sea necesario 30 tablet 0   . diclofenac (VOLTAREN) 75 MG EC tablet 1 po bid with food.  Tome una tableta por boca dos veces diarias con comida 60 tablet 0   . gabapentin (NEURONTIN) 300 MG capsule Take 1 capsule by  mouth twice daily 60 capsule 2   . insulin NPH-regular Human (NOVOLIN 70/30 RELION) (70-30) 100 UNIT/ML injection Inject 50 units SQ before breakfast and 30-50 units before evening meal.   Injecte 50 unidades subcutaneamente dos veces al dia con el alimento y 30-50 unidades antes de el alimento de la tarde 10 mL 0   . losartan (COZAAR) 100 MG tablet Take 1 tablet by mouth once daily 30 tablet 1   . metFORMIN (GLUCOPHAGE) 1000 MG tablet TAKE 1 TABLET BY MOUTH TWICE DAILY WITH A MEAL 180 tablet 0   . Omega-3  Fatty Acids (FISH OIL) 1000 MG CAPS Take by mouth.     Marland Kitchen omeprazole (PRILOSEC) 20 MG capsule Take 1 capsule (20 mg total) by mouth daily. 30 capsule 1    No Known Allergies   ROS:  Pertinent items noted in HPI and remainder of comprehensive ROS otherwise negative.  Blood pressure 136/78, pulse 86, temperature 98.3 F (36.8 C), temperature source Oral, resp. rate 18, height 5\' 2"  (1.575 m), weight 72.6 kg, SpO2 98 %. Physical Exam Constitutional:      Appearance: She is well-developed.  HENT:     Head: Normocephalic.  Cardiovascular:     Rate and Rhythm: Normal rate.  Pulmonary:     Effort: Pulmonary effort is normal.  Abdominal:     General: There is distension.     Palpations: Abdomen is soft.     Tenderness: There is abdominal tenderness in the right upper quadrant and epigastric area.  Skin:    General: Skin is warm and dry.  Neurological:     General: No focal deficit present.     Mental Status: She is alert and oriented to person, place, and time.  Psychiatric:        Mood and Affect: Mood normal.        Behavior: Behavior normal.      Results: Results for orders placed or performed during the hospital encounter of 12/04/19 (from the past 48 hour(s))  Urinalysis, Routine w reflex microscopic Urine, Clean Catch     Status: Abnormal   Collection Time: 12/03/19  9:32 PM  Result Value Ref Range   Color, Urine YELLOW YELLOW   APPearance CLEAR CLEAR   Specific Gravity, Urine 1.020 1.005 - 1.030   pH 7.5 5.0 - 8.0   Glucose, UA NEGATIVE NEGATIVE mg/dL   Hgb urine dipstick NEGATIVE NEGATIVE   Bilirubin Urine NEGATIVE NEGATIVE   Ketones, ur NEGATIVE NEGATIVE mg/dL   Protein, ur NEGATIVE NEGATIVE mg/dL   Nitrite NEGATIVE NEGATIVE   Leukocytes,Ua SMALL (A) NEGATIVE    Comment: Performed at [facility], [address], [place], [place] [address]  Urinalysis, Microscopic (reflex)     Status: Abnormal   Collection Time: 12/03/19  9:32 PM  Result Value Ref Range   RBC /  HPF NONE SEEN 0 - 5 RBC/hpf   WBC, UA 6-10 0 - 5 WBC/hpf   Bacteria, UA RARE (A) NONE SEEN   Squamous Epithelial / LPF 0-5 0 - 5    Comment: Performed at [facility], [address] [place], [place] [address]  CBC with Differential     Status: Abnormal   Collection Time: 12/03/19 10:10 PM  Result Value Ref Range   WBC 8.3 4.0 - 10.5 K/uL   RBC 3.87 3.87 - 5.11 MIL/uL   Hemoglobin 11.8 (L) 12.0 - 15.0 g/dL   HCT 36.3 36 - 46 %   MCV 93.8 80.0 - 100.0 fL  MCH 30.5 26.0 - 34.0 pg   MCHC 32.5 30.0 - 36.0 g/dL   RDW 13.9 11.5 - 15.5 %   Platelets 293 150 - 400 K/uL   nRBC 0.0 0.0 - 0.2 %   Neutrophils Relative % 61 %   Neutro Abs 5.2 1.7 - 7.7 K/uL   Lymphocytes Relative 27 %   Lymphs Abs 2.2 0.7 - 4.0 K/uL   Monocytes Relative 7 %   Monocytes Absolute 0.5 0.1 - 1.0 K/uL   Eosinophils Relative 4 %   Eosinophils Absolute 0.3 0.0 - 0.5 K/uL   Basophils Relative 1 %   Basophils Absolute 0.0 0.0 - 0.1 K/uL   Immature Granulocytes 0 %   Abs Immature Granulocytes 0.03 0.00 - 0.07 K/uL    Comment: Performed at [facility], [address], [place], [place] [address]  Basic metabolic panel     Status: Abnormal   Collection Time: 12/03/19 10:10 PM  Result Value Ref Range   Sodium 139 135 - 145 mmol/L   Potassium 4.0 3.5 - 5.1 mmol/L   Chloride 102 98 - 111 mmol/L   CO2 26 22 - 32 mmol/L   Glucose, Bld 108 (H) 70 - 99 mg/dL    Comment: Glucose reference range applies only to samples taken after fasting for at least 8 hours.   BUN 16 6 - 20 mg/dL   Creatinine, Ser 0.89 0.44 - 1.00 mg/dL   Calcium 9.6 8.9 - 10.3 mg/dL   GFR, Estimated >60 >60 mL/min   Anion gap 11 5 - 15    Comment: Performed at [facility], [address], [place], [place] [address]  Hepatic function panel     Status: None   Collection Time: 12/03/19 10:10 PM  Result Value Ref Range   Total Protein 8.1 6.5 - 8.1 g/dL   Albumin 4.1 3.5 - 5.0 g/dL   AST 24 15 - 41 U/L   ALT 28 0 - 44 U/L   Alkaline Phosphatase 75  38 - 126 U/L   Total Bilirubin 0.5 0.3 - 1.2 mg/dL   Bilirubin, Direct <0.1 0.0 - 0.2 mg/dL   Indirect Bilirubin NOT CALCULATED 0.3 - 0.9 mg/dL    Comment: Performed at [facility], [address] [place], [place] [address]  Lipase, blood     Status: None   Collection Time: 12/03/19 10:10 PM  Result Value Ref Range   Lipase 26 11 - 51 U/L    Comment: Performed at [facility], [address], [place], [place] [address]    CT Abdomen Pelvis W Contrast  Result Date: 12/04/2019 CLINICAL DATA:  Abdominal pain with nausea EXAM: CT ABDOMEN AND PELVIS WITH CONTRAST TECHNIQUE: Multidetector CT imaging of the abdomen and pelvis was performed using the standard protocol following bolus administration of intravenous contrast. CONTRAST:  192mL OMNIPAQUE IOHEXOL 300 MG/ML  SOLN COMPARISON:  March 01, 2017 FINDINGS: Lower chest: There is mild scarring in the lung bases. There are foci of coronary artery calcification. Hepatobiliary: There is hepatic steatosis. No focal liver lesions are appreciable. The gallbladder wall is not appreciably thickened. There is no biliary duct dilatation. Pancreas: No pancreatic mass or inflammatory focus. Spleen: No splenic lesions are evident. Adrenals/Urinary Tract: Adrenals bilaterally appear unremarkable. There is a cyst arising from the posterior lower pole of the right kidney measuring 2.2 x 2.2 cm. There is a 7 x 7 mm cyst in the mid right kidney nearby. There is no appreciable hydronephrosis on either side. There is  no renal or ureteral calculus on either side. Urinary bladder is midline with wall thickness within normal limits. Stomach/Bowel: There is no appreciable bowel wall or mesenteric thickening. Terminal ileum appears normal. There is no evident bowel obstruction. There is no free air or portal venous air. Vascular/Lymphatic: No abdominal aortic aneurysm. There are foci of aortic atherosclerosis. Major venous structures appear patent. There is no evident adenopathy  in the abdomen or pelvis. Reproductive: The uterus is midline and canted to the left. No evident adnexal mass. Other: The appendix measures 7 mm which is borderline prominent in its midportion. On axial and to a lesser degree sagittal images, there is suggestion of slight mesenteric thickening along the mid appendiceal region. This finding is not readily confirmed on coronal images. There is no fluid surrounding the appendix. This appearance is somewhat equivocal for earliest changes of acute appendiceal inflammation in the mid appendiceal region. No abscess or ascites evident in the abdomen pelvis. There is mild fat in the umbilicus. Musculoskeletal: There are foci of degenerative change in the lumbar spine. No blastic or lytic bone lesions. No intramuscular lesions. IMPRESSION: 1. Appendix is at the upper normal in size with questionable subtle mesenteric thickening in the mid appendiceal region which is appreciable on axial images and subtle on the sagittal images but not clearly seen on coronal images. Appendix otherwise appears normal. This appearance may indicate earliest changes of appendicitis. This appearance warrants close clinical assessment and assessment with respect to white blood cell count. Surgical consultation for assessment may be warranted. Depending on clinical assessment, short interval imaging surveillance may be warranted. Appendix: Location: Appendix arises medially from the cecum extending to just to the right of midline at the level of the upper right iliac crest. Diameter: 7 mm, upper normal Appendicolith: None Mucosal hyper-enhancement: None Extraluminal gas: None Periappendiceal collection: No fluid collection. Slight stranding along the mid appendix on axial and to a lesser extent sagittal images but not confirmed on coronal images. 2.  No bowel obstruction.  No abscess in the abdomen or pelvis. 3.  No evident renal or ureteral calculus.  No hydronephrosis. 4.  Hepatic steatosis. 5.  Aortic Atherosclerosis (ICD10-I70.0). There are foci of coronary artery calcification. Critical Value/emergent results were called by telephone at the time of interpretation on 12/04/2019 at 9:11 am to provider [facility] , who verbally acknowledged these results. Electronically Signed   By: Lowella Grip III M.D.   On: 12/04/2019 09:11   US Abdomen Limited RUQ (LIVER/GB)  Result Date: 12/04/2019 CLINICAL DATA:  Right upper quadrant abdominal pain EXAM: ULTRASOUND ABDOMEN LIMITED RIGHT UPPER QUADRANT COMPARISON:  CT 12/04/2019.  Ultrasound 04/11/2018 FINDINGS: Gallbladder: No gallstones or wall thickening visualized. No sonographic Murphy sign noted by sonographer. Common bile duct: Diameter: 3 mm. Liver: No focal lesion identified. Diffusely increased hepatic parenchymal echogenicity. Portal vein is patent on color Doppler imaging with normal direction of blood flow towards the liver. Other: None. IMPRESSION: The echogenicity of the liver is increased. This is a nonspecific finding but is most commonly seen with fatty infiltration of the liver. There are no obvious focal liver lesions. Electronically Signed   By: Davina Poke D.O.   On: 12/04/2019 12:48     Assessment & Plan:  Amanda Zamora is a 59 y.o. female with PMH DM, HTN, and dyslipidemia who presents w/ 2 weeks RUQ and epigastric abdominal pain, nausea, and intermittent loose stools who has a physical exam significant for TTP RUQ and positive Obturator and Murphy's signs  w/ reassuring labwork, CT showing a borderline large appendix and significant stool burden, and abdominal US w/ no signs of gallbladder pathology but increased liver echogenicity indicating possible fatty infiltration.  Differential includes gallbladder pathology, constipation, gastritis, and early appendicitis.  Gallbladder pathology is consistent w/ RUQ pain and TTP, worsened pain with fatty foods, and family hx of cholecystectomy but less likely given normal [place]  today. CT showing stool burden and pt's hx of constipation increase likelihood of constipation, and gastritis could be caused by pt's 4 months daily NSAID use and/or H. Pylori.  Low concern for surgical abdomen given 2 week course of pt's sx, CT w/ appendix close to but within normal limits of size, and normal [place].   Pain mgmt w/ gastric cocktail per ED. Plan to f/u as outpt w/ Dr. Constance Haw. Pt instructed to return to ED if pain worsens or she experiences fevers.   All questions were answered to the satisfaction of the patient.   Virl Cagey 12/04/2019, 3:05 PM

## 2019-12-05 ENCOUNTER — Other Ambulatory Visit: Payer: Self-pay

## 2019-12-05 ENCOUNTER — Ambulatory Visit (INDEPENDENT_AMBULATORY_CARE_PROVIDER_SITE_OTHER): Payer: Self-pay | Admitting: General Surgery

## 2019-12-05 ENCOUNTER — Encounter: Payer: Self-pay | Admitting: General Surgery

## 2019-12-05 VITALS — BP 124/73 | HR 80 | Temp 99.0°F | Resp 14 | Ht 59.84 in | Wt 166.0 lb

## 2019-12-05 DIAGNOSIS — R1011 Right upper quadrant pain: Secondary | ICD-10-CM

## 2019-12-05 NOTE — Patient Instructions (Signed)
Atencin de caridad = Ingrid @ [phone]  Gammagrafa de vescula biliar Gallbladder Nuclear Scan  Dannielle Karvonen de vescula biliar (gammagrafa hepatobiliar o gammagrafa HIDA) es un estudio de diagnstico por imgenes que controla la funcin del hgado, la vescula biliar y los conductos del hgado y de la vescula biliar. Estas partes conforman el sistema hepatobiliar. Puede necesitar esta gammagrafa si tiene sntomas de enfermedad heptica o de la vescula biliar. Para este estudio, se inyectar una sustancia radioactiva (radiomarcador) en el torrente sanguneo a travs de una va intravenosa. A medida que Tree surgeon se desplaza a travs del sistema hepatobiliar, una cmara detectar la energa que Environmental consultant. La cmara producir imgenes que mostrarn cun bien funciona su sistema hepatobiliar. Informe al mdico acerca de lo siguiente:  Cualquier alergia que tenga.  Todos los [employer], incluidos vitaminas, hierbas, gotas oftlmicas, cremas y medicamentos de venta libre.  Cualquier problema que usted o sus familiares hayan tenido con anestsicos.  Cualquier trastorno de la sangre que tenga.  Cirugas previas a las que se someti.  Cualquier afeccin mdica que tenga.  Si est embarazada o podra estarlo, o si est amamantando. Cules son los riesgos?  Una gammagrafa de vescula biliar es un procedimiento seguro. Sin embargo, se ver expuesto a una pequea cantidad de radiacin.  Existe el riesgo de Nurse, mental health a los medicamentos utilizados durante el [place]. Qu ocurre antes del procedimiento? Indicaciones generales  Siga las indicaciones del mdico respecto de las restricciones de comidas o bebidas. No podr comer ni beber nada durante las 4 horas previas al estudio.  Consulte a su mdico si debe cambiar o suspender los medicamentos que toma habitualmente. Qu ocurre durante el procedimiento?   Deber recostarse boca  arriba.  Le colocarn una va intravenosa en una vena de la mano o el brazo.  El Economist se Tour manager a travs de la va [place]. Cuando esto [place], puede tener una sensacin de fro.  Una cmara (cmara gamma) se deslizar sobre su abdomen.  Se tomarn imgenes. La cmara puede girar alrededor del cuerpo. Se le pedir que permanezca quieto o que se mueva a una posicin determinada.  Se le puede administrar un medicamento (colecistocinina, CCC) a travs de la va intravenosa. Este medicamento har que la vescula biliar se vace. Es posible que sienta nuseas o tenga clicos durante un tiempo breve.  Se tomarn ms imgenes.  Despus de haber tomado todas las imgenes, le quitarn la va [place]. Este procedimiento puede variar segn el mdico y el hospital. Sander Nephew ocurre despus del procedimiento?  Debe beber gran cantidad de lquidos para ayudar al organismo a Social research officer, government.  Es su responsabilidad retirar los [employer] de la prueba. Consulte al mdico o pregunte en el departamento donde se realiza la prueba cundo estarn [employer]. Resumen  Una gammagrafa de vescula biliar (gammagrafa hepatobiliar o gammagrafa HIDA) es un estudio de diagnstico por imgenes que controla la funcin del hgado, la vescula biliar y los conductos del hgado y de la vescula biliar.  Puede necesitar esta gammagrafa si tiene sntomas de enfermedad heptica o de la vescula biliar.  Para este estudio, se inyectar una sustancia radioactiva (radiomarcador) en el torrente sanguneo a travs de una va intravenosa.  La cmara producir imgenes que mostrarn cun bien funciona su sistema hepatobiliar. Esta informacin no tiene Marine scientist el consejo del mdico. Asegrese de hacerle al mdico cualquier pregunta que tenga. Document Revised: 03/11/2017 Document Reviewed: 03/11/2017 Elsevier Patient Education  [phone]  [place] abdominal en los  adultos Abdominal Pain, Adult El dolor en el abdomen (dolor abdominal) puede tener muchas causas. A menudo, el dolor abdominal no es grave y [place] sin tratamiento o con tratamiento en la casa. Sin embargo, a Product/process development scientist abdominal es grave. El mdico le har preguntas sobre sus antecedentes mdicos y le har un examen fsico para tratar de Office manager causa del dolor abdominal. Siga estas instrucciones en su casa:  Medicamentos  Use los medicamentos de venta libre y los recetados solamente como se lo haya indicado el mdico.  No tome un laxante a menos que se lo haya indicado el mdico. Instrucciones generales  Controle su afeccin para detectar cualquier cambio.  Beba suficiente lquido como para Theatre manager la orina de color amarillo plido.  Concurra a todas las visitas de seguimiento como se lo haya indicado el mdico. Esto es importante. Comunquese con un mdico si:  El dolor abdominal cambia o empeora.  No tiene apetito o baja de peso sin proponrselo.  Est estreido o tiene diarrea durante ms de 2 o 3das.  Tiene dolor cuando orina o defeca.  El dolor abdominal lo despierta de noche.  El dolor empeora con las comidas, despus de comer o con determinados alimentos.  Tiene vmitos y no puede retener nada de lo que ingiere.  Tiene fiebre.  Observa sangre en la orina. Solicite ayuda inmediatamente si:  El dolor no desaparece tan pronto como el mdico le dijo que era esperable.  No puede dejar de vomitar.  El Social research officer, government se siente solo en zonas del abdomen, como el lado derecho o la parte inferior izquierda del abdomen. Si se localiza en la zona derecha, posiblemente podra tratarse de apendicitis.  Las heces son sanguinolentas o de color negro, o de aspecto alquitranado.  Tiene dolor intenso, clicos o distensin abdominal.  Tiene signos de deshidratacin, como los siguientes: ? Orina de color oscuro, muy escasa o falta de orina. ? Labios agrietados. ? Sequedad  de boca. ? Ojos hundidos. ? Somnolencia. ? Debilidad.  Tiene dificultad para respirar o Tourist information centre manager. Resumen  A menudo, el dolor abdominal no es grave y [place] sin tratamiento o con tratamiento en la casa. Sin embargo, a Product/process development scientist abdominal es grave.  Controle su afeccin para Actuary cambio.  Use los medicamentos de venta libre y los recetados solamente como se lo haya indicado el mdico.  Comunquese con un mdico si el dolor abdominal cambia o [place].  Busque ayuda de inmediato si tiene dolor intenso, clicos o distensin abdominal. Esta informacin no tiene Marine scientist el consejo del mdico. Asegrese de hacerle al mdico cualquier pregunta que tenga. Document Revised: 08/08/2018 Document Reviewed: 08/08/2018 Elsevier Patient Education  [place].

## 2019-12-06 NOTE — Progress Notes (Signed)
[facility] Note   HPI:  59 y.o. Female presents to clinic for follow-up evaluation after being in the hospital with pain. She is still having pain and it is mostly in the upper abdomen and on the right. She had a negative work up for gallbladder and her CT demonstrated a normal appendix that was on the larger side of normal. She had no leukocytosis or concerning feature and the pain had been going on for 2 weeks and was associated with food.   She ate a potatoe this AM and has pain again. She is having nausea too.   Review of Systems:  Nausea  Abdominal pain All other review of systems: otherwise negative   Vital Signs:  BP 124/73   Pulse 80   Temp 99 F (37.2 C) (Oral)   Resp 14   Ht 4' 11.84" (1.52 m)   Wt 166 lb (75.3 kg)   SpO2 97%   BMI 32.59 kg/m    Physical Exam:  Physical Exam Vitals reviewed.  Cardiovascular:     Rate and Rhythm: Normal rate.  Pulmonary:     Effort: Pulmonary effort is normal.  Abdominal:     General: There is distension.     Palpations: Abdomen is soft.     Tenderness: There is abdominal tenderness in the right upper quadrant and epigastric area.     Laboratory studies: Results for JESSALYNN, MCCOWAN (MRN [id number]) as of 12/06/2019 14:20  Ref. Range 12/03/2019 22:10  Sodium Latest Ref Range: 135 - 145 mmol/L 139  Potassium Latest Ref Range: 3.5 - 5.1 mmol/L 4.0  Chloride Latest Ref Range: 98 - 111 mmol/L 102  CO2 Latest Ref Range: 22 - 32 mmol/L 26  Glucose Latest Ref Range: 70 - 99 mg/dL 108 (H)  BUN Latest Ref Range: 6 - 20 mg/dL 16  Creatinine Latest Ref Range: 0.44 - 1.00 mg/dL 0.89  Calcium Latest Ref Range: 8.9 - 10.3 mg/dL 9.6  Anion gap Latest Ref Range: 5 - 15  11  Alkaline Phosphatase Latest Ref Range: 38 - 126 U/L 75  Albumin Latest Ref Range: 3.5 - 5.0 g/dL 4.1  Lipase Latest Ref Range: 11 - 51 U/L 26  AST Latest Ref Range: 15 - 41 U/L 24  ALT Latest Ref Range: 0 - 44 U/L 28  Total Protein Latest Ref  Range: 6.5 - 8.1 g/dL 8.1  Bilirubin, Direct Latest Ref Range: 0.0 - 0.2 mg/dL <0.1  Indirect Bilirubin Latest Ref Range: 0.3 - 0.9 mg/dL NOT CALCULATED  Total Bilirubin Latest Ref Range: 0.3 - 1.2 mg/dL 0.5  GFR, Estimated Latest Ref Range: >60 mL/min >60  WBC Latest Ref Range: 4.0 - 10.5 K/uL 8.3  RBC Latest Ref Range: 3.87 - 5.11 MIL/uL 3.87  Hemoglobin Latest Ref Range: 12.0 - 15.0 g/dL 11.8 (L)  HCT Latest Ref Range: 36 - 46 % 36.3  MCV Latest Ref Range: 80.0 - 100.0 fL 93.8  MCH Latest Ref Range: 26.0 - 34.0 pg 30.5  MCHC Latest Ref Range: 30.0 - 36.0 g/dL 32.5  RDW Latest Ref Range: 11.5 - 15.5 % 13.9  Platelets Latest Ref Range: 150 - 400 K/uL 293  nRBC Latest Ref Range: 0.0 - 0.2 % 0.0  Neutrophils Latest Units: % 61  Lymphocytes Latest Units: % 27  Monocytes Relative Latest Units: % 7  Eosinophil Latest Units: % 4  Basophil Latest Units: % 1  Immature Granulocytes Latest Units: % 0  NEUT# Latest Ref Range: 1.7 - 7.7 K/uL  5.2  Lymphocyte # Latest Ref Range: 0.7 - 4.0 K/uL 2.2  Monocyte # Latest Ref Range: 0.1 - 1.0 K/uL 0.5  Eosinophils Absolute Latest Ref Range: 0.0 - 0.5 K/uL 0.3  Basophils Absolute Latest Ref Range: 0.0 - 0.1 K/uL 0.0  Abs Immature Granulocytes Latest Ref Range: 0.00 - 0.07 K/uL 0.03   Imaging:  CLINICAL DATA:  Abdominal pain with nausea  EXAM: CT ABDOMEN AND PELVIS WITH CONTRAST  TECHNIQUE: Multidetector CT imaging of the abdomen and pelvis was performed using the standard protocol following bolus administration of intravenous contrast.  CONTRAST:  165mL OMNIPAQUE IOHEXOL 300 MG/ML  SOLN  COMPARISON:  March 01, 2017  FINDINGS: Lower chest: There is mild scarring in the lung bases. There are foci of coronary artery calcification.  Hepatobiliary: There is hepatic steatosis. No focal liver lesions are appreciable. The gallbladder wall is not appreciably thickened. There is no biliary duct dilatation.  Pancreas: No pancreatic mass  or inflammatory focus.  Spleen: No splenic lesions are evident.  Adrenals/Urinary Tract: Adrenals bilaterally appear unremarkable. There is a cyst arising from the posterior lower pole of the right kidney measuring 2.2 x 2.2 cm. There is a 7 x 7 mm cyst in the mid right kidney nearby. There is no appreciable hydronephrosis on either side. There is no renal or ureteral calculus on either side. Urinary bladder is midline with wall thickness within normal limits.  Stomach/Bowel: There is no appreciable bowel wall or mesenteric thickening. Terminal ileum appears normal. There is no evident bowel obstruction. There is no free air or portal venous air.  Vascular/Lymphatic: No abdominal aortic aneurysm. There are foci of aortic atherosclerosis. Major venous structures appear patent. There is no evident adenopathy in the abdomen or pelvis.  Reproductive: The uterus is midline and canted to the left. No evident adnexal mass.  Other: The appendix measures 7 mm which is borderline prominent in its midportion. On axial and to a lesser degree sagittal images, there is suggestion of slight mesenteric thickening along the mid appendiceal region. This finding is not readily confirmed on coronal images. There is no fluid surrounding the appendix. This appearance is somewhat equivocal for earliest changes of acute appendiceal inflammation in the mid appendiceal region.  No abscess or ascites evident in the abdomen pelvis. There is mild fat in the umbilicus.  Musculoskeletal: There are foci of degenerative change in the lumbar spine. No blastic or lytic bone lesions. No intramuscular lesions.  IMPRESSION: 1. Appendix is at the upper normal in size with questionable subtle mesenteric thickening in the mid appendiceal region which is appreciable on axial images and subtle on the sagittal images but not clearly seen on coronal images. Appendix otherwise appears normal. This appearance may  indicate earliest changes of appendicitis. This appearance warrants close clinical assessment and assessment with respect to white blood cell count. Surgical consultation for assessment may be warranted. Depending on clinical assessment, short interval imaging surveillance may be warranted.  Appendix: Location: Appendix arises medially from the cecum extending to just to the right of midline at the level of the upper right iliac crest.  Diameter: 7 mm, upper normal  Appendicolith: None  Mucosal hyper-enhancement: None  Extraluminal gas: None  Periappendiceal collection: No fluid collection. Slight stranding along the mid appendix on axial and to a lesser extent sagittal images but not confirmed on coronal images.  2.  No bowel obstruction.  No abscess in the abdomen or pelvis.  3.  No evident renal or ureteral calculus.  No hydronephrosis.  4.  Hepatic steatosis.  5. Aortic Atherosclerosis (ICD10-I70.0). There are foci of coronary artery calcification.  Critical Value/emergent results were called by telephone at the time of interpretation on 12/04/2019 at 9:11 am to provider [facility] , who verbally acknowledged these results.   Electronically Signed   By: Lowella Grip III M.D.   On: 12/04/2019 09:11  CLINICAL DATA:  Right upper quadrant abdominal pain  EXAM: ULTRASOUND ABDOMEN LIMITED RIGHT UPPER QUADRANT  COMPARISON:  CT 12/04/2019.  Ultrasound 04/11/2018  FINDINGS: Gallbladder:  No gallstones or wall thickening visualized. No sonographic Murphy sign noted by sonographer.  Common bile duct:  Diameter: 3 mm.  Liver:  No focal lesion identified. Diffusely increased hepatic parenchymal echogenicity. Portal vein is patent on color Doppler imaging with normal direction of blood flow towards the liver.  Other: None.  IMPRESSION: The echogenicity of the liver is increased. This is a nonspecific finding but is most commonly  seen with fatty infiltration of the liver. There are no obvious focal liver lesions.   Electronically Signed   By: Davina Poke D.O.   On: 12/04/2019 12:48  Assessment:  59 y.o. yo Female with continued abdominal pain. I am worried about biliary dyskinesia given the continued issues.   Plan:  - HIDA ordered, she is self pay so will need to refer to [facility]   - Will call with results  - PRN Follow up after HIDA  - Go to ED with worsening pain    Curlene Labrum, MD [facility] [address] [place], [place] [address] [phone] (office)

## 2019-12-09 ENCOUNTER — Telehealth: Payer: Self-pay | Admitting: General Surgery

## 2019-12-09 NOTE — Telephone Encounter (Signed)
[facility]  Besides the HIDA we were referring her to [facility]. Maybe she is talking about that?   Curlene Labrum, MD [facility] [address] [place], [place] [address] [phone] (office)

## 2019-12-09 NOTE — Telephone Encounter (Signed)
Patient called asking about an appointment to another office. I reviewed last office note which only stated that the patient needs to be set up for a HIDA scan. Please advise

## 2019-12-16 ENCOUNTER — Encounter (HOSPITAL_COMMUNITY)
Admission: RE | Admit: 2019-12-16 | Discharge: 2019-12-16 | Disposition: A | Discharge status: Home / Self Care | Payer: Self-pay | Source: Ambulatory Visit | Attending: General Surgery | Admitting: General Surgery

## 2019-12-16 ENCOUNTER — Other Ambulatory Visit: Payer: Self-pay

## 2019-12-16 DIAGNOSIS — R1011 Right upper quadrant pain: Secondary | ICD-10-CM | POA: Insufficient documentation

## 2019-12-16 MED ORDER — TECHNETIUM TC 99M MEBROFENIN IV KIT
5.0000 | PACK | Freq: Once | INTRAVENOUS | Status: AC | PRN
  Administered 2019-12-16: 5.4 via INTRAVENOUS

## 2019-12-16 MED ORDER — SINCALIDE 5 MCG IJ SOLR
INTRAMUSCULAR | Status: AC
  Administered 2019-12-16: 1.5 ug
  Filled 2019-12-16: qty 5

## 2019-12-16 MED ORDER — STERILE WATER FOR INJECTION IJ SOLN
INTRAMUSCULAR | Status: AC
  Administered 2019-12-16: 1.5 mL
  Filled 2019-12-16: qty 10

## 2019-12-17 ENCOUNTER — Other Ambulatory Visit: Payer: Self-pay | Admitting: Physician Assistant

## 2019-12-17 DIAGNOSIS — E1165 Type 2 diabetes mellitus with hyperglycemia: Secondary | ICD-10-CM

## 2019-12-17 DIAGNOSIS — E785 Hyperlipidemia, unspecified: Secondary | ICD-10-CM

## 2019-12-17 DIAGNOSIS — I10 Essential (primary) hypertension: Secondary | ICD-10-CM

## 2019-12-18 ENCOUNTER — Telehealth (INDEPENDENT_AMBULATORY_CARE_PROVIDER_SITE_OTHER): Payer: Self-pay | Admitting: General Surgery

## 2019-12-18 DIAGNOSIS — R1011 Right upper quadrant pain: Secondary | ICD-10-CM

## 2019-12-18 NOTE — Telephone Encounter (Signed)
Called daughter and apt made

## 2019-12-18 NOTE — Telephone Encounter (Signed)
[facility]  Attempted to call daughter, Verdis Frederickson to discuss results at patient's request to the office. Left a message that I will try her back.  CLINICAL DATA:  Recurrent biliary colic, suspected biliary dyskinesia, continued RIGHT upper quadrant pain, abdominal pain for 3 weeks  EXAM: NUCLEAR MEDICINE HEPATOBILIARY IMAGING WITH GALLBLADDER EF  TECHNIQUE: Sequential images of the abdomen were obtained out to 60 minutes following intravenous administration of radiopharmaceutical. After slow intravenous infusion of 1.5 micrograms Cholecystokinin, gallbladder ejection fraction was determined.  RADIOPHARMACEUTICALS:  5.4 mCi Tc-31m Choletec IV  COMPARISON:  None  FINDINGS: Normal tracer extraction from bloodstream indicating normal hepatocellular function.  Normal excretion of tracer into biliary tree.  Gallbladder visualized at 17 min.  Small bowel visualized at 10 min.  No hepatic retention of tracer.  Subjectively normal emptying of tracer from gallbladder following CCK administration.  Calculated gallbladder ejection fraction is 64%, normal.  Patient reported abdominal pain following CCK administration.  Normal gallbladder ejection fraction following CCK stimulation is greater than 40% at 1 hour.  IMPRESSION: Patent biliary tree with normal gallbladder ejection fraction of 64% following CCK stimulation.  Patient reported abdominal pain following CCK.   Electronically Signed   By: Lavonia Dana M.D.   On: 12/16/2019 16:43

## 2019-12-18 NOTE — Telephone Encounter (Signed)
[facility]  Spoke with daughter and told results. Patient is going to come tomorrow so we can consent for surgery. Sounds like her symptoms are worsening and she is unable to work.   Curlene Labrum, MD [facility] [address] [place],  [address] [phone] (office)

## 2019-12-19 ENCOUNTER — Other Ambulatory Visit: Payer: Self-pay

## 2019-12-19 ENCOUNTER — Ambulatory Visit (INDEPENDENT_AMBULATORY_CARE_PROVIDER_SITE_OTHER): Payer: Self-pay | Admitting: General Surgery

## 2019-12-19 VITALS — BP 115/56 | HR 77 | Temp 97.4°F | Resp 16 | Ht 59.8 in | Wt 167.0 lb

## 2019-12-19 DIAGNOSIS — K811 Chronic cholecystitis: Secondary | ICD-10-CM | POA: Insufficient documentation

## 2019-12-19 NOTE — Patient Instructions (Signed)
Colecistitis Cholecystitis  La colecistitis es la inflamacin de la vescula biliar. A menudo se la conoce como ataque de la vescula biliar. La vescula biliar es un rgano que tiene forma de pera y se encuentra debajo del hgado, del lado derecho del cuerpo. La vescula biliar almacena bilis, un lquido que ayuda al organismo a digerir las grasas. Si la bilis se acumula en la vescula biliar, esta se inflama. Esta afeccin se puede presentar de manera repentina. La colecistitis es una afeccin grave y requiere Clinical research associate. Cules son las causas? La causa ms frecuente de esta afeccin son los clculos biliares. Los clculos biliares pueden obstruir los conductos (vas biliares) que transportan la bilis desde la vescula biliar. Esto causa la acumulacin de bilis. Algunas otras causas son las siguientes:  Dao a la vescula biliar debido a una disminucin del flujo sanguneo.  Infecciones de los conductos biliares.  Cicatrices y obstrucciones en los conductos biliares.  Tumores en el hgado, el pncreas o la vescula biliar. Qu incrementa el riesgo? Es ms probable que contraiga esta afeccin si:  Tiene anemia drepanoctica.  Toma pldoras anticonceptivas o [place] estrgeno.  Tiene enfermedad heptica por alcoholismo.  Tiene cirrosis en el hgado.  Recibe alimentacin a travs de una vena (nutricin parenteral).  Est gravemente enfermo.  No come ni bebe durante un tiempo prolongado. Esto se conoce tambin como ayuno.  Es obeso.  Pierde peso demasiado rpido.  Est embarazada.  Tiene niveles altos de grasas (triglicridos) en Herbalist.  Tiene pancreatitis. Cules son los signos o sntomas? Los sntomas de esta afeccin incluyen:  Dolor en el abdomen, especialmente en la zona superior derecha del abdomen.  Sensibilidad o meteorismo en el abdomen.  Nuseas.  Vmitos.  Cristy Hilts.  Escalofros. Cmo se diagnostica? Esta afeccin se diagnostica mediante los  antecedentes mdicos y un examen fsico. Tambin pueden hacerle otras pruebas, incluidas las siguientes:  Pruebas de diagnstico por imgenes, por ejemplo: ? Ecografa de la vescula biliar. ? Exploracin por tomografa computarizada (TC) del abdomen. ? Gammagrafa hepatobiliar con cido iminodiactico (HIDA). Este estudio permite que el mdico observe el trnsito de la bilis desde el hgado a la vescula biliar y Waylan Boga el intestino delgado. ? Una resonancia magntica (RM).  Anlisis de [place], tales como: ? Un hemograma completo. La cantidad de glbulos blancos puede ser ms elevada de lo normal. ? Pruebas funcionales hepticas. Ciertos tipos de clculos biliares hacen que algunos resultados sean ms altos de lo normal. Cmo se trata? El tratamiento puede incluir:  [place] para extirpar la vescula biliar (colecistectoma).  Administracin de antibiticos, generalmente a travs de una va intravenosa.  Hacer ayuno durante cierto tiempo.  Administracin de lquidos intravenosos (i.v.).  Medicamentos para tratar Conservation officer, historic buildings o los vmitos. Siga estas indicaciones en su casa:  Si le realizaron [place], siga las indicaciones de su mdico acerca del cuidado en el hogar despus del procedimiento. Medicamentos   [employer] de venta libre y los recetados solamente como se lo haya indicado el mdico.  Si le recetaron un antibitico, tmelo como se lo haya indicado el mdico. No deje de tomar el antibitico, aunque comience a sentirse mejor. Indicaciones generales  Siga las indicaciones del mdico respecto de qu comer o beber. Cuando le permitan comer, no coma ni beba nada que desencadene los sntomas.  No levante ningn objeto que pese ms de 10libras (4,5kg) o el lmite de peso que le hayan indicado, hasta que el mdico le diga que puede [place].  No consuma  ningn producto que contenga nicotina o tabaco, como cigarrillos y Psychologist, sport and exercise. Si necesita ayuda  para dejar de fumar, consulte al mdico.  Concurra a todas las visitas de seguimiento como se lo haya indicado el mdico. Esto es importante. Comunquese con un mdico si:  El dolor no se alivia con los [employer].  Tiene fiebre. Solicite ayuda de inmediato si:  El dolor se desplaza a otra parte del abdomen o a la espalda.  Sigue teniendo sntomas o tiene sntomas nuevos, incluso con Dispensing optician. Resumen  La colecistitis es la inflamacin de la vescula biliar.  La causa ms frecuente de esta afeccin son los clculos biliares. Los clculos biliares pueden obstruir los conductos (vas biliares) que transportan la bilis desde la vescula biliar.  Los sntomas frecuentes son dolor en el abdomen, nuseas, vmitos, fiebre y escalofros.  Esta afeccin se trata con ciruga para extirpar la vescula biliar, medicamentos, ayuno y administracin de lquidos intravenosos (i.v.).  Siga las indicaciones de su mdico en relacin con las comidas y bebidas. No coma nada que desencadene los sntomas. Esta informacin no tiene Marine scientist el consejo del mdico. Asegrese de hacerle al mdico cualquier pregunta que tenga. Document Revised: 07/06/2017 Document Reviewed: 07/06/2017 Elsevier Patient Education  [place] Laparoscopic Cholecystectomy Una colecistectoma laparoscpica es un procedimiento que se realiza para extirpar la vescula biliar. La vescula biliar es un rgano que tiene forma de pera y se encuentra debajo del hgado, del lado derecho del cuerpo. La vescula biliar almacena bilis, un lquido que ayuda a [employer]. La colecistectoma se realiza con frecuencia debido a la inflamacin de la vescula biliar (colecistitis). Esta afeccin normalmente se debe a la acumulacin de clculos biliares (colelitiasis) en la vescula biliar. Estos clculos pueden obstruir el flujo de la bilis, lo que produce inflamacin y Social research officer, government. En los  [employer], podr ser [place]. Este procedimiento se realiza a travs de incisiones pequeas en el abdomen (ciruga laparoscpica). Se introduce un endoscopio delgado que tiene Public relations account executive (laparoscopio) a travs de una incisin. A travs de las otras incisiones, se introducen pequeos instrumentos quirrgicos. En algunos casos, un procedimiento de [place] laparoscpica puede convertirse en un tipo de ciruga que se realiza a travs de una incisin ms grande [place] Informe al mdico acerca de lo siguiente:  Cualquier alergia que tenga.  Todos los [employer], incluidos vitaminas, hierbas, gotas oftlmicas, cremas y medicamentos de venta libre.  Cualquier problema que usted o sus familiares hayan tenido con anestsicos.  Cualquier enfermedad de la sangre que tenga.  Cirugas a las que se someti.  Cualquier afeccin mdica que tenga.  Si est embarazada o podra estarlo. Cules son los riesgos? En general, se trata de un procedimiento seguro. Sin embargo, pueden ocurrir complicaciones, por ejemplo:  Infeccin.  Hemorragia.  Reacciones alrgicas a los medicamentos.  Daos a Catering manager u otros rganos.  Un clculo que queda en el conducto biliar comn (coldoco). El conducto coldoco transporta la bilis desde la vescula biliar hacia el intestino delgado.  Una filtracin de bilis del conducto cstico que se comprime cuando se extirpa la vescula biliar. Medicamentos  Consulte al mdico sobre: ? Quarry manager o suspender los medicamentos que toma habitualmente. Esto es muy importante si toma medicamentos para la diabetes o anticoagulantes. ? Tomar medicamentos como aspirina e ibuprofeno. Estos medicamentos pueden tener un efecto anticoagulante en la [place]. No tome estos medicamentos antes del procedimiento si su mdico le  indica que no lo haga.  Pueden indicarle un antibitico para ayudar a prevenir infecciones. Instrucciones  generales  Infrmele al mdico antes de la ciruga si se ha resfriado o si tiene una infeccin.  Haga que alguien lo lleve a su casa desde el hospital o la clnica.  Pregntele al mdico cmo se Scientist, clinical (histocompatibility and immunogenetics) o se Museum/gallery curator de la Leisure centre manager. Qu ocurre durante el procedimiento?   Para disminuir el riesgo de contraer una infeccin: ? El equipo mdico se lavar o se Transport planner. ? Le lavarn la piel con jabn. ? Pueden rasurarle la zona [place].  Pueden colocarle un tubo (catter) intravenoso en una de las venas.  Le administrarn uno o ms de los siguientes medicamentos: ? Un medicamento para ayudarlo a relajarse (sedante). ? Un medicamento que lo har dormir (anestesia general).  Le colocarn un tubo en la boca para que pueda respirar.  Su cirujano le har varios cortes pequeos (incisiones) en el abdomen.  El laparoscopio se introducir a travs de una de las pequeas incisiones. La cmara del laparoscopio enviar imgenes a una pantalla de televisin (monitor) que se encuentra en el quirfano. Esto permitir a su Adult nurse del abdomen.  Le inyectarn un gas similar al aire en el abdomen. Esto expandir el abdomen para que el cirujano tenga ms lugar para Chief of Staff.  El resto del instrumental necesario para el procedimiento se introducir a travs de las otras incisiones. Se extirpar la vescula biliar a travs de una de las incisiones.  Se puede examinar el conducto coldoco. Si se encuentran clculos en el conducto coldoco, tal vez deban extirparse.  Despus de la extirpacin de la vescula biliar, se cerrarn las incisiones con puntos (suturas), grapas o goma para cerrar la piel.  Las incisiones pueden cubrirse con una venda (vendaje). Este procedimiento puede variar segn el mdico y el hospital. Sander Nephew ocurre despus del procedimiento?  Le controlarn la presin arterial, la frecuencia cardaca, la frecuencia respiratoria y Retail buyer de  oxgeno en la sangre hasta que desaparezca el efecto de los medicamentos administrados.  Le darn analgsicos para Financial controller, si es necesario.  No conduzca durante 24horas si le administraron un sedante. Esta informacin no tiene Marine scientist el consejo del mdico. Asegrese de hacerle al mdico cualquier pregunta que tenga. Document Revised: 05/13/2017 Document Reviewed: 07/20/2015 Elsevier Patient Education  [place].

## 2019-12-19 NOTE — H&P (Signed)
[facility] History and Physical   Chief Complaint    Follow-up      Amanda Zamora is a 59 y.o. female.  HPI:  Amanda Zamora is a 59 yo who has DM, GERD who came to the ED with abdominal pain 2 weeks ago and was worked up with [place] and [place]. These were negative but she followed up as an outpatient and a HIDA scan was done. the patient had a normal EF and pain with CCK.  She says she has continued to RUQ pain that radiates to her back. She says that she has associated nausea. The pain is worse with meals and says that the pain is the same as the CCK administration. She has not been able to work because of the constant pain.  H&P obtained with an interpretor.   Past Medical History:  Diagnosis Date  . Detached retina   . GERD (gastroesophageal reflux disease)   . History of pyelonephritis   . Type 2 diabetes mellitus ([place])     Past Surgical History:  Procedure Laterality Date  . EYE SURGERY    . TUBAL LIGATION      Family History  Problem Relation Age of Onset  . Breast cancer Sister   . Diabetes Sister     Social History   Tobacco Use  . Smoking status: Never Smoker  . Smokeless tobacco: Never Used  Vaping Use  . Vaping Use: Never used  Substance Use Topics  . Alcohol use: Not Currently    Comment: previously would drink occ. beer last drink 06/2017  . Drug use: No    Medications: I have reviewed the patient's current medications. Allergies as of 12/19/2019   No Known Allergies     Medication List       Accurate as of December 19, 2019  2:06 PM. If you have any questions, ask your nurse or doctor.        atorvastatin 40 MG tablet Commonly known as: LIPITOR Tome una tableta por boca diaria   cyclobenzaprine 10 MG tablet Commonly known as: FLEXERIL 1 po q 8 hour prn.  Tome una tableta por boca cada 8 horas cuando sea necesario   diclofenac 75 MG EC tablet Commonly known as: VOLTAREN 1 po bid with food.  Tome una tableta por boca dos  veces diarias con comida   Fish Oil 1000 MG Caps Take by mouth.   gabapentin 300 MG capsule Commonly known as: NEURONTIN Take 1 capsule by mouth twice daily   losartan 100 MG tablet Commonly known as: COZAAR Take 1 tablet by mouth once daily   metFORMIN 1000 MG tablet Commonly known as: GLUCOPHAGE TAKE 1 TABLET BY MOUTH TWICE DAILY WITH A MEAL   NovoLIN 70/30 ReliOn (70-30) 100 UNIT/ML injection Generic drug: insulin NPH-regular Human Inject 50 units SQ before breakfast and 30-50 units before evening meal.   Injecte 50 unidades subcutaneamente dos veces al dia con el alimento y 30-50 unidades antes de el alimento de la tarde   omeprazole 20 MG capsule Commonly known as: PRILOSEC Take 1 capsule (20 mg total) by mouth daily.        ROS:  A comprehensive review of systems was negative except for: Gastrointestinal: positive for abdominal pain and nausea  Blood pressure (!) 115/56, pulse 77, temperature (!) 97.4 F (36.3 C), temperature source Other (Comment), resp. rate 16, height 4' 11.8" (1.519 m), weight 167 lb (75.8 kg), SpO2 99 %. Physical Exam Vitals reviewed.  Constitutional:  Appearance: She is normal weight.  HENT:     Head: Normocephalic.     Mouth/Throat:     Mouth: Mucous membranes are moist.  Eyes:     Extraocular Movements: Extraocular movements intact.  Cardiovascular:     Rate and Rhythm: Normal rate and regular rhythm.  Pulmonary:     Effort: Pulmonary effort is normal.     Breath sounds: Normal breath sounds.  Abdominal:     General: There is no distension.     Palpations: Abdomen is soft.     Tenderness: There is abdominal tenderness in the right upper quadrant.  Musculoskeletal:        General: No swelling. Normal range of motion.     Cervical back: Normal range of motion.  Skin:    General: Skin is warm and dry.  Neurological:     General: No focal deficit present.     Mental Status: She is alert and oriented to person, place, and time.   Psychiatric:        Mood and Affect: Mood normal.        Behavior: Behavior normal.        Thought Content: Thought content normal.        Judgment: Judgment normal.     Results: CLINICAL DATA:  Recurrent biliary colic, suspected biliary dyskinesia, continued RIGHT upper quadrant pain, abdominal pain for 3 weeks  EXAM: NUCLEAR MEDICINE HEPATOBILIARY IMAGING WITH GALLBLADDER EF  TECHNIQUE: Sequential images of the abdomen were obtained out to 60 minutes following intravenous administration of radiopharmaceutical. After slow intravenous infusion of 1.5 micrograms Cholecystokinin, gallbladder ejection fraction was determined.  RADIOPHARMACEUTICALS:  5.4 mCi Tc-7m Choletec IV  COMPARISON:  None  FINDINGS: Normal tracer extraction from bloodstream indicating normal hepatocellular function.  Normal excretion of tracer into biliary tree.  Gallbladder visualized at 17 min.  Small bowel visualized at 10 min.  No hepatic retention of tracer.  Subjectively normal emptying of tracer from gallbladder following CCK administration.  Calculated gallbladder ejection fraction is 64%, normal.  Patient reported abdominal pain following CCK administration.  Normal gallbladder ejection fraction following CCK stimulation is greater than 40% at 1 hour.  IMPRESSION: Patent biliary tree with normal gallbladder ejection fraction of 64% following CCK stimulation.  Patient reported abdominal pain following CCK.   Electronically Signed   By: Lavonia Dana M.D.   On: 12/16/2019 16:43  Assessment & Plan:  Amanda Zamora is a 59 y.o. female with what seems like some degree of biliary colic/ dysfunction versus chronic cholecystitis. She has had a thorough workup and continues to have pain. She had pain with CCK.  She wants to proceed with gallbladder removal. Discussed the possible enlarged appendix on CT and that we can look at the appendix while I am there and remove  if there is any question/ although unlike.   -PLAN: I counseled the patient about the indication, risks and benefits of laparoscopic cholecystectomy.  She understands there is a very small chance for bleeding, infection, injury to normal structures (including common bile duct), conversion to open surgery, persistent symptoms, evolution of postcholecystectomy diarrhea, need for secondary interventions, anesthesia reaction, cardiopulmonary issues and other risks not specifically detailed here. I described the expected recovery, the plan for follow-up and the restrictions during the recovery phase.  All questions were answered.  Discussed risk of bleeding, infection injury to organs with appendectomy if needed/ but unlikely to need.  Discussed preop COVID test.   Called her Ophthalmologist Dr. Amedeo Plenty and  confirmed that patient has gas bubble in right eye and the only thing for anesthesia to know is to NOT USE NITROUS OXIDE.   All questions were answered to the satisfaction of the patient.    Virl Cagey 12/19/2019, 2:06 PM

## 2019-12-19 NOTE — Patient Instructions (Signed)
Instrucciones Para Antes de la Ciruga   Su ciruga est programada para-12/23/2019 @ [place]     Por favor llame al [phone] si tiene algn problema en la maana de la ciruga.                   Recuerde   No coma alimentos ni tome lquidos, incluyendo agua, despus de la medianoche del 12/22/2019.    [employer] medicinas en la maana de la ciruga con un SORBITO de agua -flexeril, gabapentin, prilosec. DO NOT take any insulin or other medications for diabetes the morning of your surgery.   Puede cepillarse los dientes en la maana de la [place].    No use joyas, maquillaje de ojos, lpiz labial, crema para el cuerpo o esmalte de uas oscuro. (Do not wear jewelry, eye makeup, lipstick, body lotion, or dark fingernail polish)   No puede usar desodorante. (you may wear deodorant)   Si va a ser ingresado despues de la ciruga, deje la maleta en el carro hasta que se le haya asignado una habitacin.    A los pacientes que se les d de alta el mismo da no se les permitir manejar a casa.     Use ropa suelta y cmoda de regreso a casa.     Colecistectoma laparoscpica, cuidados posteriores Laparoscopic Cholecystectomy, Care After Lea esta informacin sobre cmo cuidarse despus del procedimiento. El mdico tambin podr darle indicaciones ms especficas. Si tiene problemas o preguntas, llame al mdico. Siga estas indicaciones en su casa: Cuidado de los cortes de la ciruga (incisiones)   Siga las indicaciones del mdico en lo que respecta al cuidado de los cortes de la [place]. Haga lo siguiente: ? Lvese las manos con agua y jabn antes de Quar[place] manager las vendas (vendaje). Use un desinfectante para manos si no dispone de [place] y [place]. ? Cambie el vendaje como se lo haya indicado el mdico. ? No retire los puntos (suturas), la goma para cerrar la piel o las tiras [place]. Tal vez deban dejarse  puestos en la piel durante 2semanas o ms tiempo. Si las tiras [place] se despegan y se enroscan, puede recortar los bordes sueltos. No retire las tiras [facility] por completo a menos que el mdico lo autorice.  No tome baos de inmersin, no practique natacin ni use el jacuzzi hasta que el mdico lo autorice. Pregntele al mdico si puede ducharse. Thurston Pounds solo le permitan tomar baos de [place].  Controle la zona alrededor del corte todos los das para detectar signos de infeccin. Est atento a los siguientes signos: ? Aumento del enrojecimiento, de la hinchazn o del dolor. ? Ms lquido Delorise Shiner. ? Calor. ? Pus o mal olor. Actividad  No conduzca ni use maquinaria pesada mientras toma analgsicos recetados.  No levante ningn objeto que pese ms de 10libras (4,5kg) hasta que el mdico lo autorice.  No practique deportes de contacto hasta que el mdico lo autorice.  No conduzca durante 24horas si le dieron un medicamento para ayudarlo a que se relaje (sedante).  Descanse todo lo que sea necesario. No retome el trabajo ni el estudio hasta que el mdico lo autorice. Instrucciones  generales  [employer] de venta libre y los recetados solamente como se lo haya indicado el mdico.  A fin de prevenir o tratar el estreimiento mientras toma analgsicos recetados, el mdico puede recomendarle lo siguiente: ? Electronics engineer suficiente lquido para Theatre manager el pis (orina) claro o de color amarillo plido. ? Tomar medicamentos recetados o de [employer]. ? Consumir alimentos ricos en fibra, como frutas y verduras frescas, cereales integrales y frijoles. ? Limitar el consumo de alimentos con alto contenido de grasas y azcares procesados, como alimentos fritos o dulces. Comunquese con un mdico si:  Le aparece una erupcin cutnea.  Tiene ms enrojecimiento, hinchazn o dolor alrededor [employer] cortes de la [place].  Aumenta la cantidad de lquido o sangre que sale de los  cortes.  Los cortes de la ciruga se sienten calientes al tacto.  Tiene pus o percibe mal olor que [employer] cortes.  Tiene fiebre.  Se abren uno o ms de los cortes. Solicite ayuda de inmediato si:  Tiene dificultad para respirar.  Siente dolor en el pecho.  Siente dolor que empeora en la zona de los hombros.  Se desmaya o se siente mareado al ponerse de pie.  Tiene dolor muy intenso de vientre (abdomen).  Tiene malestar estomacal (nuseas) que dura ms de Optician, dispensing.  Vomita durante ms de Optician, dispensing.  Siente dolor en la pierna. Esta informacin no tiene Marine scientist el consejo del mdico. Asegrese de hacerle al mdico cualquier pregunta que tenga. Document Revised: 05/09/2016 Document Reviewed: 07/20/2015 Elsevier Patient Education  [place] general en adultos, cuidados posteriores General Anesthesia, Adult, Care After Lea esta informacin sobre cmo cuidarse despus del procedimiento. El mdico tambin podr darle instrucciones ms especficas. Comunquese con su mdico si tiene problemas o preguntas. Qu puedo esperar despus del procedimiento? Luego del procedimiento, son comunes los siguiente efectos secundarios:  Dolor o Scientist, research (life sciences) en el lugar de la va intravenosa (i.v.).  Nuseas.  Vmitos.  Dolor de Investment banker, operational.  Dificultad para concentrarse.  Sentir fro o [employer].  Debilidad o cansancio.  Somnolencia y Programmer, applications.  Malestar y Hydrologist. Estos efectos secundarios pueden afectar partes del cuerpo que no estuvieron involucradas en la ciruga. Siga estas indicaciones en su casa:  Durante al menos 24horas despus del procedimiento:  Pdale a un adulto responsable que permanezca con usted. Es importante que alguien cuide de usted hasta que se despierte y Cabin crew.  Descanse todo lo que sea necesario.  No haga lo siguiente: ? Participar en actividades en las que podra caerse o lastimarse. ? Conducir. ? Operar  maquinarias pesadas. ? Beber alcohol. ? Tomar somnferos o medicamentos que causen somnolencia. ? Firmar documentos legales ni tomar [employer]. ? Cuidar a nios por su cuenta. Qu debe comer y beber  Siga las indicaciones del mdico respecto de las restricciones de comidas o bebidas.  Cuando [employer], comience a comer cantidades pequeas de alimentos que sean blandos y fciles de Publishing copy (livianos), como una tostada. Retome su dieta habitual de forma gradual.  Beba suficiente lquido como para mantener la orina de color amarillo plido.  Si vomita, rehidrtese tomando agua, jugo o caldo transparente. Instrucciones generales  Si tiene apnea del sueo, la [place] y ciertos medicamentos pueden aumentar el riesgo de problemas respiratorios. Siga las indicaciones del mdico respecto al uso de su dispositivo para dormir: ? Siempre que duerma, incluso durante las siestas que tome en el da. ? Mientras tome analgsicos recetados, medicamentos  para dormir o medicamentos que producen somnolencia.  Reanude sus actividades normales segn lo indicado por el mdico. Pregntele al mdico qu actividades son seguras para usted.  Tome los medicamentos de venta libre y los recetados solamente como se lo haya indicado el mdico.  Si fuma, no lo haga sin supervisin.  Concurra a todas las visitas de seguimiento como se lo haya indicado el mdico. Esto es importante. Comunquese con un mdico si:  Tiene nuseas o vmitos que no mejoran con medicamentos.  No puede comer ni beber sin vomitar.  El dolor no se alivia con medicamentos.  No puede orinar.  Tiene una erupcin cutnea.  Tiene fiebre.  Presenta enrojecimiento alrededor del lugar de la va intravenosa (i.v.) que empeora. Solicite ayuda de inmediato si:  Tiene dificultad para respirar.  Siente dolor en el pecho.  Observa sangre en la orina o heces, o vomita sangre. Resumen  Despus del procedimiento, es comn tener  dolor de garganta y nuseas. Tambin es comn sentirse cansado.  Pdale a un adulto responsable que permanezca con usted durante 24 horas despus de la anestesia general. Es importante que alguien cuide de usted hasta que se despierte y Cabin crew.  Cuando [employer], comience a comer cantidades pequeas de alimentos que sean blandos y fciles de Publishing copy (livianos), como una tostada. Retome su dieta habitual de forma gradual.  Beba suficiente lquido como para mantener la orina de color amarillo plido.  Reanude sus actividades normales segn lo indicado por el mdico. Pregntele al mdico qu actividades son seguras para usted. Esta informacin no tiene Marine scientist el consejo del mdico. Asegrese de hacerle al mdico cualquier pregunta que tenga. Document Revised: 11/28/2016 Document Reviewed: 11/28/2016 Elsevier Patient Education  [place] usar clorhexidina para baarse How to Use Chlorhexidine for Bathing El gluconato de clorhexidina (CHG) es una solucin desinfectante (antisptica) que se utiliza para limpiar la piel. Puede eliminar las bacterias que normalmente viven en la piel y mantenerlas alejadas durante aproximadamente 24 horas. Para limpiarse la piel con CHG, es posible que le den lo siguiente:  Una solucin de CHG para usar en la ducha o como parte de un bao de [place].  Un pao preenvasado que contenga CHG. Limpiar la piel con CHG puede ayudar a disminuir el riesgo de infeccin:  Mientras permanece en la unidad de cuidados intensivos del hospital.  Si tiene un acceso vascular, como una va central, para proporcionar acceso a corto o largo plazo a las venas.  Si tiene un catter para que drene orina de la vejiga.  Si tiene Animal nutritionist. El respirador es un aparato que lo ayuda a Ambulance person al hacer que el aire entre y salga de sus pulmones.  Despus de [place]. Cules son los riesgos? Los riesgos de usar de CHG incluyen los  siguientes:  Reacciones en la piel.  Prdida de la audicin si el CHG ingresa en los odos.  Lesin ocular si el CHG ingresa en los ojos y no se enjuaga.  El CHG es inflamable. Asegrese de no fumar ni acercarse al fuego luego de aplicarse CHG en la piel. No use CHG:  Si es alrgico a la clorhexidina o anteriormente tuvo una reaccin a la clorhexidina.  En bebs de menos de 2 meses. Cmo usar la solucin de CHG  Use CHG nicamente como se lo indique su mdico y siga las instrucciones de la etiqueta.  Use la cantidad de CHG que le indicaron. Usualmente, la medida es  una botella. Durante una ducha Siga estos pasos al usar la solucin de CHG durante una ducha (a menos que su mdico le brinde instrucciones diferentes): 1. Comience a ducharse. 2. Lvese la cara y el cabello con el jabn y el champ que utiliza habitualmente. [place] de abajo del agua. 4. Vierta el CHG en un pao limpio. No use ningn cepillo o esponja con bordes rugosos. 5. Comience en el cuello y colquese la espuma en todo el cuerpo [employer]. Asegrese de seguir estas instrucciones: ? Si le harn una ciruga, preste especial atencin a la parte del cuerpo Research officer, political party. Frote la zona durante al menos 1 minuto. ? No use el CHG en su cabeza o cara. Si la solucin [employer] odos o los ojos, enjuague bien con [place]. ? Evite la zona genital. ? Evite las zonas de la piel que estn lastimadas o tengan cortes o raspaduras. ? Frote su espalda y [employer]. Asegrese de lavar los pliegues de la piel. 6. Deje actuar la espuma por 1 o 2 minutos, o por el tiempo que le haya indicado el mdico. 7. Enjuguese todo el cuerpo bajo la ducha. Asegrese de enjuagar bien todos los pliegues y hendiduras de la piel. 8. Seque con una toalla limpia. Despus no aplique ninguna sustancia en el cuerpo, como polvo, locin o perfume, excepto que se lo indique el mdico. Solo use las lociones  recomendadas por el fabricante. 9. Pngase pijama o ropa limpia. 10. Si es la noche anterior a la ciruga, duerma en sbanas limpias.  Durante un bao de esponja Siga estos pasos al usar la solucin de CHG durante un bao de [place] (a menos que su mdico le brinde instrucciones diferentes): 1. Lvese la cara y el cabello con el jabn y el champ que utiliza habitualmente. 2. Vierta el CHG en un pao limpio. 3. Comience en el cuello y colquese la espuma en todo el cuerpo [employer]. Asegrese de seguir estas instrucciones: ? Si le harn una ciruga, preste especial atencin a la parte del cuerpo Research officer, political party. Frote la zona durante al menos 1 minuto. ? No use el CHG en su cabeza o cara. Si la solucin [employer] odos o los ojos, enjuague bien con [place]. ? Evite la zona genital. ? Evite las zonas de la piel que estn lastimadas o tengan cortes o raspaduras. ? Frote su espalda y [employer]. Asegrese de lavar los pliegues de la piel. 4. Deje actuar la espuma por 1 o 2 minutos, o por el tiempo que le haya indicado el mdico. 5. Con un pao limpio y hmedo diferente, enjuguese bien todo el cuerpo. Asegrese de enjuagar bien todos los pliegues y hendiduras de la piel. 6. Seque con una toalla limpia. Despus no aplique ninguna sustancia en el cuerpo, como polvo, locin o perfume, excepto que se lo indique el mdico. Solo use las lociones recomendadas por el fabricante. 7. Pngase pijama o ropa limpia. 8. Si es la noche anterior a la ciruga, duerma en sbanas limpias. [employer] paos preenvasados de CHG  Use los paos de CHG nicamente como se lo haya indicado el mdico y siga las instrucciones de la etiqueta.  Use los paos de CHG sobre la piel limpia y [place].  No use el pao de CHG en la cabeza o el rostro a menos que el mdico se lo indique.  Cuando lave  con el pao de CHG: ? Evite la zona genital. ? Evite las zonas de la piel que estn lastimadas o  tengan cortes o raspaduras. Antes de la ciruga Siga estos pasos al usar un pao de CHG para limpiar antes de una ciruga (a menos que su mdico le brinde instrucciones diferentes): 1. Con el pao de CHG, frtese con firmeza la parte del cuerpo donde le realizarn la ciruga. Frtese de un lado al otro durante 3 minutos. El rea del cuerpo debe estar completamente hmeda con CHG cuando termine de frotar. 2. No enjuague. Deseche el pao y deje que el rea se seque al aire. Despus no aplique ninguna sustancia sobre el rea, como polvos, lociones o perfume. 3. Pngase pijama o ropa limpia. 4. Si es la noche anterior a la ciruga, duerma en sbanas limpias.  Para baarse en general Siga estos pasos al usar el pao de CHG para baarse en general (a menos que su mdico le brinde instrucciones diferentes). 1. Use un pao de CHG diferente en cada rea del cuerpo. Asegrese de [employer] de la piel y [place] dedos de las manos y de los pies. Lvese el cuerpo en el siguiente orden, usando un pao nuevo despus de cada paso: ? La parte delantera del cuello, los hombros y [place]. ? Ambos brazos, debajo de los brazos y [place]. ? El estmago y la zona de la ingle, evitando los genitales. ? La pierna y el pie derechos. ? La pierna y el pie izquierdos. ? La parte posterior del cuello, la espalda y las nalgas. 2. No enjuague. Deseche el pao y deje que el rea se seque al aire. Despus no aplique ninguna sustancia en el cuerpo, como polvo, locin o perfume, excepto que se lo indique el mdico. Solo use las lociones recomendadas por el fabricante. 3. Pngase pijama o ropa limpia. Comunquese con un mdico si:  La piel se irrita despus de frotarla.  Tiene preguntas sobre cmo usar la solucin o el pao. Solicite ayuda inmediatamente si:  Los ojos se ponen muy rojos o Cytogeneticist.  Siente picazn intensa en los ojos.  Siente picazn intensa en la piel y est roja o  hinchada.  Nota cambios en su audicin.  Tiene dificultad para ver.  Tiene hinchazn u hormigueo en la garganta o la boca.  Tiene dificultad para respirar.  Traga un poco de clorhexidina. Resumen  El gluconato de clorhexidina (CHG) es una solucin desinfectante (antisptica) que se utiliza para limpiar la piel. Limpiar la piel con CHG puede ayudar a disminuir el riesgo de infeccin.  Es posible que le indiquen que utilice CHG para baarse. Esta solucin puede venir en botella o en un pao preenvasado para que use sobre su piel. Siga cuidadosamente las instrucciones del mdico y las instrucciones que se encuentran en la etiqueta del producto.  No use CHG si es alrgico a la clorhexidina.  Pngase en contacto con su mdico si se le irrita la piel luego de frotar. Esta informacin no tiene Marine scientist el consejo del mdico. Asegrese de hacerle al mdico cualquier pregunta que tenga. Document Revised: 05/22/2018 Document Reviewed: 03/04/2017 Elsevier Patient Education  [place].

## 2019-12-19 NOTE — Progress Notes (Signed)
[facility] History and Physical   Chief Complaint    Follow-up      Amanda Zamora is a 59 y.o. female.  HPI:  Amanda Zamora is a 59 yo who has DM, GERD who came to the ED with abdominal pain 2 weeks ago and was worked up with [place] and [place]. These were negative but she followed up as an outpatient and a HIDA scan was done. the patient had a normal EF and pain with CCK.  She says she has continued to RUQ pain that radiates to her back. She says that she has associated nausea. The pain is worse with meals and says that the pain is the same as the CCK administration. She has not been able to work because of the constant pain.  H&P obtained with an interpretor.   Past Medical History:  Diagnosis Date  . Detached retina   . GERD (gastroesophageal reflux disease)   . History of pyelonephritis   . Type 2 diabetes mellitus ([place])     Past Surgical History:  Procedure Laterality Date  . EYE SURGERY    . TUBAL LIGATION      Family History  Problem Relation Age of Onset  . Breast cancer Sister   . Diabetes Sister     Social History   Tobacco Use  . Smoking status: Never Smoker  . Smokeless tobacco: Never Used  Vaping Use  . Vaping Use: Never used  Substance Use Topics  . Alcohol use: Not Currently    Comment: previously would drink occ. beer last drink 06/2017  . Drug use: No    Medications: I have reviewed the patient's current medications. Allergies as of 12/19/2019   No Known Allergies     Medication List       Accurate as of December 19, 2019  2:06 PM. If you have any questions, ask your nurse or doctor.        atorvastatin 40 MG tablet Commonly known as: LIPITOR Tome una tableta por boca diaria   cyclobenzaprine 10 MG tablet Commonly known as: FLEXERIL 1 po q 8 hour prn.  Tome una tableta por boca cada 8 horas cuando sea necesario   diclofenac 75 MG EC tablet Commonly known as: VOLTAREN 1 po bid with food.  Tome una tableta por boca dos  veces diarias con comida   Fish Oil 1000 MG Caps Take by mouth.   gabapentin 300 MG capsule Commonly known as: NEURONTIN Take 1 capsule by mouth twice daily   losartan 100 MG tablet Commonly known as: COZAAR Take 1 tablet by mouth once daily   metFORMIN 1000 MG tablet Commonly known as: GLUCOPHAGE TAKE 1 TABLET BY MOUTH TWICE DAILY WITH A MEAL   NovoLIN 70/30 ReliOn (70-30) 100 UNIT/ML injection Generic drug: insulin NPH-regular Human Inject 50 units SQ before breakfast and 30-50 units before evening meal.   Injecte 50 unidades subcutaneamente dos veces al dia con el alimento y 30-50 unidades antes de el alimento de la tarde   omeprazole 20 MG capsule Commonly known as: PRILOSEC Take 1 capsule (20 mg total) by mouth daily.        ROS:  A comprehensive review of systems was negative except for: Gastrointestinal: positive for abdominal pain and nausea  Blood pressure (!) 115/56, pulse 77, temperature (!) 97.4 F (36.3 C), temperature source Other (Comment), resp. rate 16, height 4' 11.8" (1.519 m), weight 167 lb (75.8 kg), SpO2 99 %. Physical Exam Vitals reviewed.  Constitutional:  Appearance: She is normal weight.  HENT:     Head: Normocephalic.     Mouth/Throat:     Mouth: Mucous membranes are moist.  Eyes:     Extraocular Movements: Extraocular movements intact.  Cardiovascular:     Rate and Rhythm: Normal rate and regular rhythm.  Pulmonary:     Effort: Pulmonary effort is normal.     Breath sounds: Normal breath sounds.  Abdominal:     General: There is no distension.     Palpations: Abdomen is soft.     Tenderness: There is abdominal tenderness in the right upper quadrant.  Musculoskeletal:        General: No swelling. Normal range of motion.     Cervical back: Normal range of motion.  Skin:    General: Skin is warm and dry.  Neurological:     General: No focal deficit present.     Mental Status: She is alert and oriented to person, place, and time.   Psychiatric:        Mood and Affect: Mood normal.        Behavior: Behavior normal.        Thought Content: Thought content normal.        Judgment: Judgment normal.     Results: CLINICAL DATA:  Recurrent biliary colic, suspected biliary dyskinesia, continued RIGHT upper quadrant pain, abdominal pain for 3 weeks  EXAM: NUCLEAR MEDICINE HEPATOBILIARY IMAGING WITH GALLBLADDER EF  TECHNIQUE: Sequential images of the abdomen were obtained out to 60 minutes following intravenous administration of radiopharmaceutical. After slow intravenous infusion of 1.5 micrograms Cholecystokinin, gallbladder ejection fraction was determined.  RADIOPHARMACEUTICALS:  5.4 mCi Tc-64m Choletec IV  COMPARISON:  None  FINDINGS: Normal tracer extraction from bloodstream indicating normal hepatocellular function.  Normal excretion of tracer into biliary tree.  Gallbladder visualized at 17 min.  Small bowel visualized at 10 min.  No hepatic retention of tracer.  Subjectively normal emptying of tracer from gallbladder following CCK administration.  Calculated gallbladder ejection fraction is 64%, normal.  Patient reported abdominal pain following CCK administration.  Normal gallbladder ejection fraction following CCK stimulation is greater than 40% at 1 hour.  IMPRESSION: Patent biliary tree with normal gallbladder ejection fraction of 64% following CCK stimulation.  Patient reported abdominal pain following CCK.   Electronically Signed   By: Lavonia Dana M.D.   On: 12/16/2019 16:43  Assessment & Plan:  Amanda Zamora is a 59 y.o. female with what seems like some degree of biliary colic/ dysfunction versus chronic cholecystitis. She has had a thorough workup and continues to have pain. She had pain with CCK.  She wants to proceed with gallbladder removal. Discussed the possible enlarged appendix on CT and that we can look at the appendix while I am there and remove  if there is any question/ although unlike.   -PLAN: I counseled the patient about the indication, risks and benefits of laparoscopic cholecystectomy.  She understands there is a very small chance for bleeding, infection, injury to normal structures (including common bile duct), conversion to open surgery, persistent symptoms, evolution of postcholecystectomy diarrhea, need for secondary interventions, anesthesia reaction, cardiopulmonary issues and other risks not specifically detailed here. I described the expected recovery, the plan for follow-up and the restrictions during the recovery phase.  All questions were answered.  Discussed risk of bleeding, infection injury to organs with appendectomy if needed/ but unlikely to need.  Discussed preop COVID test.   Called her Ophthalmologist Dr. Amedeo Plenty and  confirmed that patient has gas bubble in right eye and the only thing for anesthesia to know is to NOT USE NITROUS OXIDE.   All questions were answered to the satisfaction of the patient.    Virl Cagey 12/19/2019, 2:06 PM

## 2019-12-20 ENCOUNTER — Encounter (HOSPITAL_COMMUNITY)
Admission: RE | Admit: 2019-12-20 | Discharge: 2019-12-20 | Disposition: A | Discharge status: Home / Self Care | Payer: Self-pay | Source: Ambulatory Visit | Attending: General Surgery | Admitting: General Surgery

## 2019-12-20 ENCOUNTER — Other Ambulatory Visit (HOSPITAL_COMMUNITY)
Admission: RE | Admit: 2019-12-20 | Discharge: 2019-12-20 | Disposition: A | Discharge status: Home / Self Care | Payer: HRSA Program | Source: Ambulatory Visit | Attending: General Surgery | Admitting: General Surgery

## 2019-12-20 ENCOUNTER — Encounter (HOSPITAL_COMMUNITY): Payer: Self-pay

## 2019-12-20 ENCOUNTER — Other Ambulatory Visit: Payer: Self-pay

## 2019-12-20 DIAGNOSIS — Z01812 Encounter for preprocedural laboratory examination: Secondary | ICD-10-CM | POA: Insufficient documentation

## 2019-12-20 DIAGNOSIS — Z20822 Contact with and (suspected) exposure to covid-19: Secondary | ICD-10-CM | POA: Insufficient documentation

## 2019-12-20 DIAGNOSIS — Z01810 Encounter for preprocedural cardiovascular examination: Secondary | ICD-10-CM | POA: Insufficient documentation

## 2019-12-20 HISTORY — DX: Other specified postprocedural states: Z98.890

## 2019-12-20 HISTORY — DX: Nausea with vomiting, unspecified: R11.2

## 2019-12-20 LAB — SARS CORONAVIRUS 2 (TAT 6-24 HRS): SARS Coronavirus 2: NEGATIVE

## 2019-12-23 ENCOUNTER — Ambulatory Visit (HOSPITAL_COMMUNITY)
Admission: RE | Admit: 2019-12-23 | Discharge: 2019-12-23 | Disposition: A | Discharge status: Home / Self Care | Payer: Self-pay | Attending: General Surgery | Admitting: General Surgery

## 2019-12-23 ENCOUNTER — Encounter (HOSPITAL_COMMUNITY): Payer: Self-pay | Admitting: General Surgery

## 2019-12-23 ENCOUNTER — Ambulatory Visit (HOSPITAL_COMMUNITY): Payer: Self-pay | Admitting: Anesthesiology

## 2019-12-23 ENCOUNTER — Encounter (HOSPITAL_COMMUNITY)
Admission: RE | Disposition: A | Discharge status: Home / Self Care | Payer: Self-pay | Source: Home / Self Care | Attending: General Surgery

## 2019-12-23 ENCOUNTER — Other Ambulatory Visit: Payer: Self-pay

## 2019-12-23 DIAGNOSIS — Z833 Family history of diabetes mellitus: Secondary | ICD-10-CM | POA: Insufficient documentation

## 2019-12-23 DIAGNOSIS — Z794 Long term (current) use of insulin: Secondary | ICD-10-CM | POA: Insufficient documentation

## 2019-12-23 DIAGNOSIS — K219 Gastro-esophageal reflux disease without esophagitis: Secondary | ICD-10-CM | POA: Insufficient documentation

## 2019-12-23 DIAGNOSIS — E119 Type 2 diabetes mellitus without complications: Secondary | ICD-10-CM | POA: Insufficient documentation

## 2019-12-23 DIAGNOSIS — Z79899 Other long term (current) drug therapy: Secondary | ICD-10-CM | POA: Insufficient documentation

## 2019-12-23 DIAGNOSIS — K660 Peritoneal adhesions (postprocedural) (postinfection): Secondary | ICD-10-CM | POA: Insufficient documentation

## 2019-12-23 DIAGNOSIS — K811 Chronic cholecystitis: Secondary | ICD-10-CM | POA: Diagnosis present

## 2019-12-23 DIAGNOSIS — Z803 Family history of malignant neoplasm of breast: Secondary | ICD-10-CM | POA: Insufficient documentation

## 2019-12-23 HISTORY — PX: CHOLECYSTECTOMY: SHX55

## 2019-12-23 LAB — GLUCOSE, CAPILLARY: Glucose-Capillary: 115 mg/dL — ABNORMAL HIGH (ref 70–99)

## 2019-12-23 SURGERY — LAPAROSCOPIC CHOLECYSTECTOMY
Anesthesia: General

## 2019-12-23 MED ORDER — DEXAMETHASONE SODIUM PHOSPHATE 10 MG/ML IJ SOLN
INTRAMUSCULAR | Status: DC | PRN
  Administered 2019-12-23: 5 mg via INTRAVENOUS

## 2019-12-23 MED ORDER — KETOROLAC TROMETHAMINE 30 MG/ML IJ SOLN
30.0000 mg | Freq: Once | INTRAMUSCULAR | Status: AC
  Administered 2019-12-23: 30 mg via INTRAVENOUS
  Filled 2019-12-23: qty 1

## 2019-12-23 MED ORDER — BUPIVACAINE HCL (PF) 0.5 % IJ SOLN
INTRAMUSCULAR | Status: DC | PRN
  Administered 2019-12-23: 10 mL

## 2019-12-23 MED ORDER — SODIUM CHLORIDE 0.9 % IR SOLN
Status: DC | PRN
  Administered 2019-12-23: 1000 mL

## 2019-12-23 MED ORDER — ROCURONIUM BROMIDE 10 MG/ML (PF) SYRINGE
PREFILLED_SYRINGE | INTRAVENOUS | Status: AC
  Filled 2019-12-23: qty 10

## 2019-12-23 MED ORDER — LIDOCAINE 2% (20 MG/ML) 5 ML SYRINGE
INTRAMUSCULAR | Status: AC
  Filled 2019-12-23: qty 5

## 2019-12-23 MED ORDER — CHLORHEXIDINE GLUCONATE 0.12 % MT SOLN
15.0000 mL | Freq: Once | OROMUCOSAL | Status: AC
  Administered 2019-12-23: 15 mL via OROMUCOSAL

## 2019-12-23 MED ORDER — LACTATED RINGERS IV SOLN: Freq: Once | INTRAVENOUS | Status: AC

## 2019-12-23 MED ORDER — LACTATED RINGERS IV SOLN: INTRAVENOUS | Status: DC | PRN

## 2019-12-23 MED ORDER — PROPOFOL 10 MG/ML IV BOLUS
INTRAVENOUS | Status: DC | PRN
  Administered 2019-12-23: 200 mg via INTRAVENOUS

## 2019-12-23 MED ORDER — GLYCOPYRROLATE PF 0.2 MG/ML IJ SOSY
PREFILLED_SYRINGE | INTRAMUSCULAR | Status: DC | PRN
  Administered 2019-12-23: .2 mg via INTRAVENOUS

## 2019-12-23 MED ORDER — CHLORHEXIDINE GLUCONATE CLOTH 2 % EX PADS: 6.0000 | MEDICATED_PAD | Freq: Once | CUTANEOUS | Status: DC

## 2019-12-23 MED ORDER — MIDAZOLAM HCL 2 MG/2ML IJ SOLN
INTRAMUSCULAR | Status: DC | PRN
  Administered 2019-12-23: 2 mg via INTRAVENOUS

## 2019-12-23 MED ORDER — DIPHENHYDRAMINE HCL 50 MG/ML IJ SOLN
INTRAMUSCULAR | Status: DC | PRN
  Administered 2019-12-23: 12.5 mg via INTRAVENOUS

## 2019-12-23 MED ORDER — LIDOCAINE 2% (20 MG/ML) 5 ML SYRINGE
INTRAMUSCULAR | Status: DC | PRN
  Administered 2019-12-23: 60 mg via INTRAVENOUS

## 2019-12-23 MED ORDER — SUGAMMADEX SODIUM 200 MG/2ML IV SOLN
INTRAVENOUS | Status: DC | PRN
  Administered 2019-12-23: 200 mg via INTRAVENOUS

## 2019-12-23 MED ORDER — SCOPOLAMINE 1 MG/3DAYS TD PT72
MEDICATED_PATCH | TRANSDERMAL | Status: AC
  Filled 2019-12-23: qty 1

## 2019-12-23 MED ORDER — DEXAMETHASONE SODIUM PHOSPHATE 10 MG/ML IJ SOLN
INTRAMUSCULAR | Status: AC
  Filled 2019-12-23: qty 1

## 2019-12-23 MED ORDER — ONDANSETRON HCL 4 MG/2ML IJ SOLN
INTRAMUSCULAR | Status: AC
  Filled 2019-12-23: qty 2

## 2019-12-23 MED ORDER — PROPOFOL 10 MG/ML IV BOLUS
INTRAVENOUS | Status: AC
  Filled 2019-12-23: qty 20

## 2019-12-23 MED ORDER — HYDROMORPHONE HCL 1 MG/ML IJ SOLN
0.2500 mg | INTRAMUSCULAR | Status: DC | PRN
  Administered 2019-12-23 (×2): 0.5 mg via INTRAVENOUS
  Filled 2019-12-23 (×2): qty 0.5

## 2019-12-23 MED ORDER — ORAL CARE MOUTH RINSE: 15.0000 mL | Freq: Once | OROMUCOSAL | Status: AC

## 2019-12-23 MED ORDER — ROCURONIUM BROMIDE 10 MG/ML (PF) SYRINGE
PREFILLED_SYRINGE | INTRAVENOUS | Status: DC | PRN
  Administered 2019-12-23: 4 mg via INTRAVENOUS

## 2019-12-23 MED ORDER — OXYCODONE HCL 5 MG PO TABS
5.0000 mg | ORAL_TABLET | ORAL | 0 refills | Status: DC | PRN
Start: 2019-12-23 — End: 2020-01-06

## 2019-12-23 MED ORDER — ONDANSETRON HCL 4 MG PO TABS: 4.0000 mg | ORAL_TABLET | Freq: Three times a day (TID) | ORAL | 1 refills | Status: DC | PRN

## 2019-12-23 MED ORDER — PROMETHAZINE HCL 25 MG/ML IJ SOLN: 6.2500 mg | INTRAMUSCULAR | Status: DC | PRN

## 2019-12-23 MED ORDER — MEPERIDINE HCL 50 MG/ML IJ SOLN: 6.2500 mg | INTRAMUSCULAR | Status: DC | PRN

## 2019-12-23 MED ORDER — FENTANYL CITRATE (PF) 100 MCG/2ML IJ SOLN
INTRAMUSCULAR | Status: AC
  Filled 2019-12-23: qty 2

## 2019-12-23 MED ORDER — DIPHENHYDRAMINE HCL 50 MG/ML IJ SOLN
INTRAMUSCULAR | Status: AC
  Filled 2019-12-23: qty 1

## 2019-12-23 MED ORDER — SCOPOLAMINE 1 MG/3DAYS TD PT72
MEDICATED_PATCH | TRANSDERMAL | Status: DC | PRN
  Administered 2019-12-23: 1 via TRANSDERMAL

## 2019-12-23 MED ORDER — GLYCOPYRROLATE PF 0.2 MG/ML IJ SOSY
PREFILLED_SYRINGE | INTRAMUSCULAR | Status: AC
  Filled 2019-12-23: qty 1

## 2019-12-23 MED ORDER — DOCUSATE SODIUM 100 MG PO CAPS: 100.0000 mg | ORAL_CAPSULE | Freq: Two times a day (BID) | ORAL | 0 refills | Status: DC | PRN

## 2019-12-23 MED ORDER — HEMOSTATIC AGENTS (NO CHARGE) OPTIME
TOPICAL | Status: DC | PRN
  Administered 2019-12-23: 1 via TOPICAL

## 2019-12-23 MED ORDER — BUPIVACAINE HCL (PF) 0.5 % IJ SOLN
INTRAMUSCULAR | Status: AC
  Filled 2019-12-23: qty 30

## 2019-12-23 MED ORDER — SODIUM CHLORIDE 0.9 % IV SOLN
2.0000 g | INTRAVENOUS | Status: AC
  Administered 2019-12-23: 2 g via INTRAVENOUS
  Filled 2019-12-23: qty 2

## 2019-12-23 MED ORDER — FENTANYL CITRATE (PF) 250 MCG/5ML IJ SOLN
INTRAMUSCULAR | Status: DC | PRN
  Administered 2019-12-23: 100 ug via INTRAVENOUS

## 2019-12-23 MED ORDER — MIDAZOLAM HCL 2 MG/2ML IJ SOLN
INTRAMUSCULAR | Status: AC
  Filled 2019-12-23: qty 2

## 2019-12-23 MED ORDER — ONDANSETRON HCL 4 MG/2ML IJ SOLN
INTRAMUSCULAR | Status: DC | PRN
  Administered 2019-12-23: 4 mg via INTRAVENOUS

## 2019-12-23 SURGICAL SUPPLY — 48 items
APPLIER CLIP ROT 10 11.4 M/L (STAPLE) ×3
BAG RETRIEVAL 10 (BASKET) ×1
BAG RETRIEVAL 10MM (BASKET) ×1
BLADE SURG 15 STRL LF DISP TIS (BLADE) ×1 IMPLANT
BLADE SURG 15 STRL SS (BLADE) ×3
CLIP APPLIE ROT 10 11.4 M/L (STAPLE) ×1 IMPLANT
CLOTH BEACON ORANGE TIMEOUT ST (SAFETY) ×3 IMPLANT
COVER LIGHT HANDLE STERIS (MISCELLANEOUS) ×6 IMPLANT
COVER WAND RF STERILE (DRAPES) ×3 IMPLANT
DECANTER SPIKE VIAL GLASS SM (MISCELLANEOUS) ×3 IMPLANT
DERMABOND ADVANCED (GAUZE/BANDAGES/DRESSINGS) ×2
DERMABOND ADVANCED .7 DNX12 (GAUZE/BANDAGES/DRESSINGS) ×1 IMPLANT
DURAPREP 26ML APPLICATOR (WOUND CARE) ×3 IMPLANT
ELECT REM PT RETURN 9FT ADLT (ELECTROSURGICAL) ×3
ELECTRODE REM PT RTRN 9FT ADLT (ELECTROSURGICAL) ×1 IMPLANT
GLOVE BIO SURGEON STRL SZ 6.5 (GLOVE) ×2 IMPLANT
GLOVE BIO SURGEONS STRL SZ 6.5 (GLOVE) ×1
GLOVE BIOGEL PI IND STRL 6.5 (GLOVE) ×1 IMPLANT
GLOVE BIOGEL PI IND STRL 7.0 (GLOVE) ×3 IMPLANT
GLOVE BIOGEL PI IND STRL 7.5 (GLOVE) ×1 IMPLANT
GLOVE BIOGEL PI INDICATOR 6.5 (GLOVE) ×2
GLOVE BIOGEL PI INDICATOR 7.0 (GLOVE) ×6
GLOVE BIOGEL PI INDICATOR 7.5 (GLOVE) ×2
GLOVE ECLIPSE 6.5 STRL STRAW (GLOVE) ×3 IMPLANT
GLOVE ECLIPSE 7.0 STRL STRAW (GLOVE) ×3 IMPLANT
GLOVE SURG SS PI 7.5 STRL IVOR (GLOVE) ×3 IMPLANT
GOWN STRL REUS W/TWL LRG LVL3 (GOWN DISPOSABLE) ×12 IMPLANT
HEMOSTAT SNOW SURGICEL 2X4 (HEMOSTASIS) ×3 IMPLANT
INST SET LAPROSCOPIC AP (KITS) ×3 IMPLANT
KIT TURNOVER KIT A (KITS) ×3 IMPLANT
MANIFOLD NEPTUNE II (INSTRUMENTS) ×3 IMPLANT
NEEDLE INSUFFLATION 14GA 120MM (NEEDLE) ×3 IMPLANT
NS IRRIG 1000ML POUR BTL (IV SOLUTION) ×3 IMPLANT
PACK LAP CHOLE LZT030E (CUSTOM PROCEDURE TRAY) ×3 IMPLANT
PAD ARMBOARD 7.5X6 YLW CONV (MISCELLANEOUS) ×3 IMPLANT
SET BASIN LINEN APH (SET/KITS/TRAYS/PACK) ×3 IMPLANT
SET TUBE SMOKE EVAC HIGH FLOW (TUBING) ×3 IMPLANT
SLEEVE ENDOPATH XCEL 5M (ENDOMECHANICALS) ×3 IMPLANT
SUT MNCRL AB 4-0 PS2 18 (SUTURE) ×6 IMPLANT
SUT VICRYL 0 UR6 27IN ABS (SUTURE) ×3 IMPLANT
SYS BAG RETRIEVAL 10MM (BASKET) ×1
SYSTEM BAG RETRIEVAL 10MM (BASKET) ×1 IMPLANT
TROCAR ENDO BLADELESS 11MM (ENDOMECHANICALS) ×3 IMPLANT
TROCAR XCEL NON-BLD 5MMX100MML (ENDOMECHANICALS) ×3 IMPLANT
TROCAR XCEL UNIV SLVE 11M 100M (ENDOMECHANICALS) ×3 IMPLANT
TUBE CONNECTING 12'X1/4 (SUCTIONS) ×1
TUBE CONNECTING 12X1/4 (SUCTIONS) ×2 IMPLANT
WARMER LAPAROSCOPE (MISCELLANEOUS) ×3 IMPLANT

## 2019-12-23 NOTE — Progress Notes (Signed)
[facility]  Patient's daughter Sonia Baller notified surgery completed. Appendix was normal. Rx sent to [facility] in [place].  Letter for work for [employer], Doctor, hospital written.  Curlene Labrum, MD [facility] [address] [place], [place] [address] [phone] (office)

## 2019-12-23 NOTE — Op Note (Signed)
Operative Note   Preoperative Diagnosis: Chronic cholecystitis , possible dilated appendix on CT    Postoperative Diagnosis: Chronic cholecystitis, normal appearing appendix    Procedure(s) Performed: Laparoscopic cholecystectomy   Surgeon: Lanell Matar. 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 General endotracheal anesthesia was induced. Intravenous antibiotics were administered per protocol. An orogastric tube positioned to decompress the stomach. The abdomen was prepared and draped in the usual sterile fashion.    A supraumbilical incision was made and a Veress technique was utilized to achieve pneumoperitoneum to 15 mmHg with carbon dioxide. A 11 mm optiview port was placed through the supraumbilical region, and a 10 mm 0-degree operative laparoscope was introduced. The area underlying the trocar and Veress needle were inspected and without evidence of injury.  Remaining trocars were placed under direct vision. Two 5 mm ports were placed in the right abdomen, between the anterior axillary and midclavicular line.  A final 11 mm port was placed through the mid-epigastrium, near the falciform ligament.  The patient was placed in Trendelenburg and right side up, and the appendix was visualized and was normal in appearance without inflammation.  She was then placed in reverse Trendelenburg.    Adhesions between the liver and abdominal wall consistent with Rochele Raring type adhesions were taken down as well as adhesions between the omentum and the gallbladder which is  consistent with some chronic inflammation.   The gallbladder fundus was elevated cephalad and the infundibulum was retracted to the patient's right. The gallbladder/cystic duct junction was skeletonized. The cystic artery noted in the triangle of Calot and was also skeletonized.  We then continued liberal medial and lateral dissection until the critical view of safety was achieved.    The cystic duct and cystic artery were doubly clipped and divided. The gallbladder was then dissected from the liver bed with electrocautery. The specimen was placed in an Endopouch and was retrieved through the epigastric site.   Final inspection revealed acceptable hemostasis. Surgical SNOW was placed in the gallbladder bed.  Trocars were removed and pneumoperitoneum was released.  The epigastric and umbilical port sites were smaller than my finger tip. Skin incisions were closed with 4-0 Monocryl subcuticular sutures and Dermabond. The patient was awakened from anesthesia and extubated without complication.    Curlene Labrum, MD [facility] [address] [place], [place] [address] [phone] (office)

## 2019-12-23 NOTE — Discharge Instructions (Signed)
Discharge Laparoscopic Surgery Instructions:  Common Complaints: Right shoulder pain is common after laparoscopic surgery. This is secondary to the gas used in the surgery being trapped under the diaphragm.  Walk to help your body absorb the gas. This will improve in a few days. Pain at the port sites are common, especially the larger port sites. This will improve with time.  Some nausea is common and poor appetite. The main goal is to stay hydrated the first few days after surgery.   Diet/ Activity: Diet as tolerated. You may not have an appetite, but it is important to stay hydrated. Drink 64 ounces of water a day. Your appetite will return with time.  Shower per your regular routine daily.  Do not take hot showers. Take warm showers that are less than 10 minutes. Rest and listen to your body, but do not remain in bed all day.  Walk everyday for at least 15-20 minutes. Deep cough and move around every 1-2 hours in the first few days after surgery.  Do not lift > 10 lbs, perform excessive bending, pushing, pulling, squatting for 1-2 weeks after surgery.  Do not pick at the dermabond glue on your incision sites.  This glue film will remain in place for 1-2 weeks and will start to peel off.  Do not place lotions or balms on your incision unless instructed to specifically by Dr. Takuma Cifelli.   Pain Expectations and Narcotics: -After surgery you will have pain associated with your incisions and this is normal. The pain is muscular and nerve pain, and will get better with time. -You are encouraged and expected to take non narcotic medications like tylenol and ibuprofen (when able) to treat pain as multiple modalities can aid with pain treatment. -Narcotics are only used when pain is severe or there is breakthrough pain. -You are not expected to have a pain score of 0 after surgery, as we cannot prevent pain. A pain score of 3-4 that allows you to be functional, move, walk, and tolerate some activity is  the goal. The pain will continue to improve over the days after surgery and is dependent on your surgery. -Due to [place] law, we are only able to give a certain amount of pain medication to treat post operative pain, and we only give additional narcotics on a patient by patient basis.  -For most laparoscopic surgery, studies have shown that the majority of patients only need 10-15 narcotic pills, and for open surgeries most patients only need 15-20.   -Having appropriate expectations of pain and knowledge of pain management with non narcotics is important as we do not want anyone to become addicted to narcotic pain medication.  -Using ice packs in the first 48 hours and heating pads after 48 hours, wearing an abdominal binder (when recommended), and using over the counter medications are all ways to help with pain management.   -Simple acts like meditation and mindfulness practices after surgery can also help with pain control and research has proven the benefit of these practices.  Medication: Take tylenol and ibuprofen as needed for pain control, alternating every 4-6 hours.  Example:  Tylenol 1000mg @ 6am, 12noon, 6pm, 12midnight (Do not exceed 4000mg of tylenol a day). Ibuprofen 800mg @ 9am, 3pm, 9pm, 3am (Do not exceed 3600mg of ibuprofen a day).  Take Roxicodone for breakthrough pain every 4 hours.  Take Colace for constipation related to narcotic pain medication. If you do not have a bowel movement in 2 days, take Miralax   over the counter.  Drink plenty of water to also prevent constipation.   Contact Information: If you have questions or concerns, please call our office, [phone], Monday- Thursday 8AM-5PM and Friday 8AM-12Noon.  If it is after hours or on the weekend, please call [facility], [phone], and ask to speak to the surgeon on call for Dr. Constance Haw at [facility].   Instrucciones de alta para la ciruga laparoscpica:  Kandis Fantasia Comunes: El dolor en el hombro derecho es  comn despus de la ciruga laparoscpica. Esto es secundario a que el gas utilizado en la ciruga queda atrapado debajo del diafragma. [place] para ayudar a su cuerpo a absorber el gas. Esto mejorar en [employer]. El dolor en los sitios portuarios es comn, especialmente en los sitios portuarios ms grandes. Esto mejorar con [employer]. Algunas nuseas son comunes y falta de apetito. El objetivo principal es mantenerse hidratado los primeros das despus de la [place].  Dieta / Actividad: Dieta segn la tolerancia. Es posible que no tenga apetito, pero es importante mantenerse hidratado. Beba 64 onzas de agua al [employer]. Tu apetito volver con Physiological scientist. Dchese segn su rutina habitual todos los [place]. No tome duchas calientes. Tome duchas tibias que duren menos de 10 minutos. Descanse y escuche a su cuerpo, pero no permanezca en la cama todo Games developer. Camine [employer] al menos 15 a 20 minutos. Toser profundamente y [employer] cada 1-2 horas durante los primeros das despus de la [place]. No levante ms de 10 libras, realice flexiones, empujes, tirones, sentadillas en cuclillas durante 1-2 semanas despus de la ciruga. No toque el pegamento dermabond en los sitios de la incisin. Esta pelcula de pegamento permanecer en su lugar durante 1-2 semanas y comenzar a desprenderse. No coloque lociones o blsamos en su incisin a menos que se lo indique especficamente el Dr. Constance Haw.  Expectativas de Social research officer, government y narcticos: -Despus de la ciruga, tendr dolor asociado con sus incisiones y esto es normal. El dolor es muscular y [place], y mejorar con Physiological scientist. -Se le recomienda y se espera que tome medicamentos no narcticos como tylenol e ibuprofeno (cuando sea posible) para tratar Conservation officer, historic buildings, ya que mltiples modalidades pueden ayudar con el tratamiento del Social research officer, government. -Los narcticos solo se usan cuando el dolor es severo o hay dolor irruptivo. -No se espera que tenga una puntuacin de Social research officer, government de 0  despus de la ciruga, ya que no podemos prevenir Conservation officer, historic buildings. El objetivo es una puntuacin de Social research officer, government de 3-4 que le permita ser funcional, [place], caminar y Tax inspector. El Engineer, structural mejorando [employer] posteriores a la [place] y depende de su [place]. -Debido a la ley de [place], solo podemos administrar una cierta cantidad de analgsicos para tratar Conservation officer, historic buildings posoperatorio, y solo administramos narcticos adicionales paciente por paciente. -Para la mayora de las cirugas laparoscpicas, los estudios han demostrado que la mayora de los pacientes solo necesitan de 10 a 15 pastillas narcticas, y para las cirugas abiertas, la mayora de los pacientes solo necesitan de 15 a 86 pastillas. -Tener expectativas adecuadas sobre el dolor y conocimiento del manejo del Social research officer, government con no narcticos es importante ya que no queremos que nadie se vuelva adicto a los analgsicos narcticos. -El uso de bolsas de hielo en las primeras 60 horas y almohadillas trmicas despus de las 79 horas, el uso de una faja abdominal (cuando se recomiende) y el uso de medicamentos de venta libre son formas de ayudar con el  manejo del dolor. -Actos simples como la meditacin y las prcticas de atencin plena despus de la ciruga tambin pueden ayudar con el control del dolor y la investigacin ha demostrado el beneficio de Astronomer.  Medicamento: Tome tylenol e ibuprofeno segn sea necesario para controlar el dolor, alternando cada 4-6 horas. Ejemplo: Tylenol 1000 mg a las 6 a. M., A las 12 del medioda, a las 6 p. M., A las 12 de la medianoche (No exceda los 4000 mg de tylenol al da). Ibuprofeno 800 mg a las 9 a. M., 3 p. M., 9 p. M., 3 a. M. (No exceda los 3600 mg de ibuprofeno al da). Tome Roxicodone para el dolor irruptivo cada 4 horas. Tome Colace para el estreimiento relacionado con analgsicos narcticos. Si no tiene una evacuacin intestinal en 2 das, tome Miralax sin receta. Karma Greaser agua para prevenir tambin el estreimiento.  Informacin del contacto: Si tiene preguntas o inquietudes, llame a Zigmund Daniel, [phone], de lunes a jueves de 8 a. M. A 5 p. M. Y los viernes de 8 a. M. A 12 del medioda. Si es fuera del horario de atencin o durante el fin de [place], llame al nmero principal de [place], [phone], y pida hablar con el cirujano de guardia del Dr. Constance Haw en [place].   Laparoscopic Cholecystectomy, Care After This sheet gives you information about how to care for yourself after your procedure. Your doctor may also give you more specific instructions. If you have problems or questions, contact your doctor. Follow these instructions at home: Care for cuts from surgery (incisions)   Follow instructions from your doctor about how to take care of your cuts from surgery. Make sure you: ? Wash your hands with soap and water before you change your bandage (dressing). If you cannot use soap and water, use hand sanitizer. ? Change your bandage as told by your doctor. ? Leave stitches (sutures), skin glue, or skin tape (adhesive) strips in place. They may need to stay in place for 2 weeks or longer. If tape strips get loose and curl up, you may trim the loose edges. Do not remove tape strips completely unless your doctor says it is okay.  Do not take baths, swim, or use a hot tub until your doctor says it is okay.   You may shower.  Check your surgical cut area every day for signs of infection. Check for: ? More redness, swelling, or pain. ? More fluid or blood. ? Warmth. ? Pus or a bad smell. Activity  Do not drive or use heavy machinery while taking prescription pain medicine.  Do not lift anything that is heavier than 10 lb (4.5 kg) until your doctor says it is okay.  Do not play contact sports until your doctor says it is okay.  Do not drive for 24 hours if you were given a medicine to help you relax (sedative).  Rest as needed. Do not  return to work or school until your doctor says it is okay. General instructions  Take over-the-counter and prescription medicines only as told by your doctor.  To prevent or treat constipation while you are taking prescription pain medicine, your doctor may recommend that you: ? Drink enough fluid to keep your pee (urine) clear or pale yellow. ? Take over-the-counter or prescription medicines. ? Eat foods that are high in fiber, such as fresh fruits and vegetables, whole grains, and beans. ? Limit foods that are high in fat and processed sugars, such  as fried and sweet foods. Contact a doctor if:  You develop a rash.  You have more redness, swelling, or pain around your surgical cuts.  You have more fluid or blood coming from your surgical cuts.  Your surgical cuts feel warm to the touch.  You have pus or a bad smell coming from your surgical cuts.  You have a fever.  One or more of your surgical cuts breaks open. Get help right away if:  You have trouble breathing.  You have chest pain.  You have pain that is getting worse in your shoulders.  You faint or feel dizzy when you stand.  You have very bad pain in your belly (abdomen).  You are sick to your stomach (nauseous) for more than one day.  You have throwing up (vomiting) that lasts for more than one day.  You have leg pain. This information is not intended to replace advice given to you by your health care provider. Make sure you discuss any questions you have with your health care provider. Document Revised: 01/13/2017 Document Reviewed: 07/20/2015 Elsevier Patient Education  2020 [place], cuidados posteriores Laparoscopic Cholecystectomy, Care After Lea esta informacin sobre cmo cuidarse despus del procedimiento. El mdico tambin podr darle indicaciones ms especficas. Si tiene problemas o preguntas, llame al mdico. Siga estas indicaciones en su casa: Cuidado  de los cortes de la ciruga (incisiones)   Siga las indicaciones del mdico en lo que respecta al cuidado de los cortes de la [place]. Haga lo siguiente: ? Lvese las manos con agua y jabn antes de Quarry manager las vendas (vendaje). Use un desinfectante para manos si no dispone de [place]. ? Cambie el vendaje como se lo haya indicado el mdico. ? No retire los puntos (suturas), la goma para cerrar la piel o las tiras [place]. Tal vez deban dejarse puestos en la piel durante 2semanas o ms tiempo. Si las tiras [place] se despegan y se enroscan, puede recortar los bordes sueltos. No retire las tiras [facility] por completo a menos que el mdico lo autorice.  No tome baos de inmersin, no practique natacin ni use el jacuzzi hasta que el mdico lo autorice.   Puede ducharse.  Controle la zona alrededor del corte todos los das para detectar signos de infeccin. Est atento a los siguientes signos: ? Aumento del enrojecimiento, de la hinchazn o del dolor. ? Ms lquido Delorise Shiner. ? Calor. ? Pus o mal olor. Actividad  No conduzca ni use maquinaria pesada mientras toma analgsicos recetados.  No levante ningn objeto que pese ms de 10libras (4,5kg) hasta que el mdico lo autorice.  No practique deportes de contacto hasta que el mdico lo autorice.  No conduzca durante 24horas si le dieron un medicamento para ayudarlo a que se relaje (sedante).  Descanse todo lo que sea necesario. No retome el trabajo ni el estudio hasta que el mdico lo autorice. Instrucciones generales  [employer] de venta libre y los recetados solamente como se lo haya indicado el mdico.  A fin de prevenir o tratar el estreimiento mientras toma analgsicos recetados, el mdico puede recomendarle lo siguiente: ? Electronics engineer suficiente lquido para Theatre manager el pis (orina) claro o de color amarillo plido. ? Tomar medicamentos recetados o de [employer]. ? Consumir alimentos ricos en fibra, como frutas y  verduras frescas, cereales integrales y frijoles. ? Limitar el consumo de alimentos con alto contenido de grasas y azcares procesados, como alimentos fritos o  dulces. Comunquese con un mdico si:  Le aparece una erupcin cutnea.  Tiene ms enrojecimiento, hinchazn o dolor alrededor [employer] cortes de la [place].  Aumenta la cantidad de lquido o sangre que sale de los cortes.  Los cortes de la ciruga se sienten calientes al tacto.  Tiene pus o percibe mal olor que [employer] cortes.  Tiene fiebre.  Se abren uno o ms de los cortes. Solicite ayuda de inmediato si:  Tiene dificultad para respirar.  Siente dolor en el pecho.  Siente dolor que empeora en la zona de los hombros.  Se desmaya o se siente mareado al ponerse de pie.  Tiene dolor muy intenso de vientre (abdomen).  Tiene malestar estomacal (nuseas) que dura ms de Optician, dispensing.  Vomita durante ms de Optician, dispensing.  Siente dolor en la pierna. Esta informacin no tiene Marine scientist el consejo del mdico. Asegrese de hacerle al mdico cualquier pregunta que tenga. Document Revised: 05/09/2016 Document Reviewed: 07/20/2015 Elsevier Patient Education  [place].

## 2019-12-23 NOTE — Transfer of Care (Signed)
Immediate Anesthesia Transfer of Care Note  Patient: Ephrata Dunlop  Procedure(s) Performed: LAPAROSCOPIC CHOLECYSTECTOMY (N/A )  Patient Location: PACU  Anesthesia Type:General  Level of Consciousness: awake, alert  and oriented  Airway & Oxygen Therapy: Patient Spontanous Breathing  Post-op Assessment: Report given to RN and Post -op Vital signs reviewed and stable  Post vital signs: Reviewed and stable  Last Vitals:  Vitals Value Taken Time  BP    Temp    Pulse 72 12/23/19 1408  Resp 13 12/23/19 1408  SpO2 97 % 12/23/19 1408  Vitals shown include unvalidated device data.  Last Pain:  Vitals:   12/23/19 1154  TempSrc: Oral  PainSc: 9          Complications: No complications documented.

## 2019-12-23 NOTE — Anesthesia Procedure Notes (Signed)
Procedure Name: Intubation Date/Time: 12/23/2019 1:01 PM Performed by: Alain Marion, CRNA Pre-anesthesia Checklist: Patient identified, Emergency Drugs available, Suction available and Patient being monitored Patient Re-evaluated:Patient Re-evaluated prior to induction Oxygen Delivery Method: Circle System Utilized Preoxygenation: Pre-oxygenation with 100% oxygen Induction Type: IV induction Ventilation: Mask ventilation without difficulty Laryngoscope Size: Miller and 2 Tube type: Oral Tube size: 6.5 mm Number of attempts: 1 Airway Equipment and Method: Stylet and Oral airway Placement Confirmation: ETT inserted through vocal cords under direct vision,  positive ETCO2 and breath sounds checked- equal and bilateral Secured at: 21 cm Tube secured with: Tape Dental Injury: Teeth and Oropharynx as per pre-operative assessment

## 2019-12-23 NOTE — Interval H&P Note (Signed)
History and Physical Interval Note:  12/23/2019 12:24 PM  Amanda Zamora  has presented today for surgery, with the diagnosis of Chronic cholecystitis.  The various methods of treatment have been discussed with the patient and family. After consideration of risks, benefits and other options for treatment, the patient has consented to  Procedure(s): LAPAROSCOPIC CHOLECYSTECTOMY (N/A) as a surgical intervention.  The patient's history has been reviewed, patient examined, no change in status, stable for surgery.  I have reviewed the patient's chart and labs.  Questions were answered to the patient's satisfaction.    Nausea with anesthesia, no nitrous oxide due to the potential for air in her retina per optho. Anesthesia aware.   Virl Cagey

## 2019-12-23 NOTE — Anesthesia Preprocedure Evaluation (Signed)
Anesthesia Evaluation  Patient identified by MRN, date of birth, ID band Patient awake    Reviewed: Allergy & Precautions, NPO status , Patient's Chart, lab work & pertinent test results  History of Anesthesia Complications (+) PONV and history of anesthetic complications  Airway Mallampati: II  TM Distance: >3 FB Neck ROM: Full    Dental  (+) Dental Advisory Given, Upper Dentures, Missing   Pulmonary neg pulmonary ROS,    Pulmonary exam normal breath sounds clear to auscultation       Cardiovascular Exercise Tolerance: Good hypertension, Pt. on medications Normal cardiovascular exam Rhythm:Regular Rate:Normal     Neuro/Psych  Neuromuscular disease negative psych ROS   GI/Hepatic Neg liver ROS, GERD  Medicated and Controlled,  Endo/Other  diabetes, Well Controlled, Type 2, Oral Hypoglycemic Agents, Insulin Dependent  Renal/GU   negative genitourinary   Musculoskeletal   Abdominal   Peds  Hematology negative hematology ROS (+)   Anesthesia Other Findings   Reproductive/Obstetrics                             Anesthesia Physical Anesthesia Plan  ASA: III  Anesthesia Plan: General   Post-op Pain Management:    Induction: Intravenous  PONV Risk Score and Plan: Ondansetron, Midazolam, Scopolamine patch - Pre-op, Metaclopromide and Dexamethasone  Airway Management Planned: Oral ETT  Additional Equipment:   Intra-op Plan:   Post-operative Plan: Extubation in OR  Informed Consent: I have reviewed the patients History and Physical, chart, labs and discussed the procedure including the risks, benefits and alternatives for the proposed anesthesia with the patient or authorized representative who has indicated his/her understanding and acceptance.     Dental advisory given  Plan Discussed with: CRNA and Surgeon  Anesthesia Plan Comments:         Anesthesia Quick  Evaluation

## 2019-12-23 NOTE — Anesthesia Postprocedure Evaluation (Signed)
Anesthesia Post Note  Patient: Amanda Zamora  Procedure(s) Performed: LAPAROSCOPIC CHOLECYSTECTOMY (N/A )  Patient location during evaluation: PACU Anesthesia Type: General Level of consciousness: awake and alert and oriented Pain management: pain level controlled Vital Signs Assessment: post-procedure vital signs reviewed and stable Respiratory status: spontaneous breathing and respiratory function stable Cardiovascular status: blood pressure returned to baseline Postop Assessment: no apparent nausea or vomiting Anesthetic complications: no   No complications documented.   Last Vitals:  Vitals:   12/23/19 1500 12/23/19 1509  BP: 117/72 133/60  Pulse: 77 74  Resp: 17 14  Temp:  36.7 C  SpO2: 94% 94%    Last Pain:  Vitals:   12/23/19 1509  TempSrc: Oral  PainSc: 7                  [name] C Kasy Iannacone

## 2019-12-23 NOTE — Progress Notes (Signed)
Patient ready for discharge, waiting on daughter to arrive.

## 2019-12-24 ENCOUNTER — Encounter (HOSPITAL_COMMUNITY): Payer: Self-pay | Admitting: General Surgery

## 2019-12-24 LAB — SURGICAL PATHOLOGY

## 2019-12-30 ENCOUNTER — Ambulatory Visit: Payer: Self-pay | Admitting: Physician Assistant

## 2020-01-02 ENCOUNTER — Other Ambulatory Visit: Payer: Self-pay

## 2020-01-02 ENCOUNTER — Other Ambulatory Visit (HOSPITAL_COMMUNITY)
Admission: RE | Admit: 2020-01-02 | Discharge: 2020-01-02 | Disposition: A | Discharge status: Home / Self Care | Payer: Self-pay | Source: Ambulatory Visit | Attending: Physician Assistant | Admitting: Physician Assistant

## 2020-01-02 DIAGNOSIS — E1165 Type 2 diabetes mellitus with hyperglycemia: Secondary | ICD-10-CM | POA: Insufficient documentation

## 2020-01-02 DIAGNOSIS — E785 Hyperlipidemia, unspecified: Secondary | ICD-10-CM | POA: Insufficient documentation

## 2020-01-02 DIAGNOSIS — I10 Essential (primary) hypertension: Secondary | ICD-10-CM | POA: Insufficient documentation

## 2020-01-02 LAB — LIPID PANEL
Cholesterol: 218 mg/dL — ABNORMAL HIGH (ref 0–200)
HDL: 38 mg/dL — ABNORMAL LOW (ref >40)
LDL Cholesterol: 141 mg/dL — ABNORMAL HIGH (ref 0–99)
Total CHOL/HDL Ratio: 5.7 RATIO
Triglycerides: 196 mg/dL — ABNORMAL HIGH (ref <150)
VLDL: 39 mg/dL (ref 0–40)

## 2020-01-02 LAB — COMPREHENSIVE METABOLIC PANEL
ALT: 20 U/L (ref 0–44)
AST: 15 U/L (ref 15–41)
Albumin: 3.7 g/dL (ref 3.5–5.0)
Alkaline Phosphatase: 82 U/L (ref 38–126)
Anion gap: 8 (ref 5–15)
BUN: 14 mg/dL (ref 6–20)
CO2: 27 mmol/L (ref 22–32)
Calcium: 9.2 mg/dL (ref 8.9–10.3)
Chloride: 103 mmol/L (ref 98–111)
Creatinine, Ser: 0.84 mg/dL (ref 0.44–1.00)
GFR, Estimated: >60 mL/min (ref >60)
Glucose, Bld: 144 mg/dL — ABNORMAL HIGH (ref 70–99)
Potassium: 4.7 mmol/L (ref 3.5–5.1)
Sodium: 138 mmol/L (ref 135–145)
Total Bilirubin: 0.6 mg/dL (ref 0.3–1.2)
Total Protein: 7.4 g/dL (ref 6.5–8.1)

## 2020-01-02 LAB — HEMOGLOBIN A1C
Hgb A1c MFr Bld: 7.8 % — ABNORMAL HIGH (ref 4.8–5.6)
Mean Plasma Glucose: 177.16 mg/dL

## 2020-01-06 ENCOUNTER — Ambulatory Visit: Payer: Self-pay | Admitting: Physician Assistant

## 2020-01-06 ENCOUNTER — Encounter: Payer: Self-pay | Admitting: Physician Assistant

## 2020-01-06 VITALS — BP 120/70 | HR 75 | Temp 97.1°F | Ht 59.8 in | Wt 165.0 lb

## 2020-01-06 DIAGNOSIS — Z789 Other specified health status: Secondary | ICD-10-CM

## 2020-01-06 DIAGNOSIS — E1165 Type 2 diabetes mellitus with hyperglycemia: Secondary | ICD-10-CM

## 2020-01-06 DIAGNOSIS — K5900 Constipation, unspecified: Secondary | ICD-10-CM

## 2020-01-06 DIAGNOSIS — E785 Hyperlipidemia, unspecified: Secondary | ICD-10-CM

## 2020-01-06 DIAGNOSIS — E669 Obesity, unspecified: Secondary | ICD-10-CM

## 2020-01-06 DIAGNOSIS — I10 Essential (primary) hypertension: Secondary | ICD-10-CM

## 2020-01-06 MED ORDER — ATORVASTATIN CALCIUM 80 MG PO TABS: 80.0000 mg | ORAL_TABLET | Freq: Every day | ORAL | 4 refills | Status: DC

## 2020-01-06 MED ORDER — DICLOFENAC SODIUM 75 MG PO TBEC
DELAYED_RELEASE_TABLET | ORAL | 0 refills | Status: DC
Start: 2020-01-06 — End: 2020-04-01

## 2020-01-06 NOTE — Progress Notes (Signed)
BP 120/70   Pulse 75   Temp (!) 97.1 F (36.2 C)   Ht 4' 11.8" (1.519 m)   Wt 165 lb (74.8 kg)   LMP 02/15/2011 (Approximate)   SpO2 100%   BMI 32.44 kg/m    Subjective:    Patient ID: Amanda Zamora, female    DOB: Aug 16, 1960, 59 y.o.   MRN: [id number]  HPI: Amanda Zamora is a 59 y.o. female presenting on 01/06/2020 for Diabetes, Hyperlipidemia, and Hypertension   HPI    Pt had a negative covid 19 screening questionnaire.  Chief Complaint  Patient presents with  . Diabetes  . Hyperlipidemia  . Hypertension     Pt states that she has been constipated since her GB surgery on 11/8  Pt hasn't had f/u with surgeon.  She thinks she is going to get a phone call tomorrow to follow up  She is no longer taking the oxycodone.  She has been finished with them for several weeks.    She has No nausea.  She is eating.    She is drinking maybe 3 or 4 bottles of water/day.   She is using colace but says it doesn't help  She uses 50u insulin each morning.  Sometimes at night she will only use 30 or 20 because the bs is low.      Relevant past medical, surgical, family and social history reviewed and updated as indicated. Interim medical history since our last visit reviewed. Allergies and medications reviewed and updated.   Current Outpatient Medications:  .  atorvastatin (LIPITOR) 40 MG tablet, Tome una tableta por boca diaria, Disp: 30 tablet, Rfl: 4 .  diclofenac (VOLTAREN) 75 MG EC tablet, 1 po bid with food.  Tome una tableta por boca dos veces diarias con comida, Disp: 60 tablet, Rfl: 0 .  docusate sodium (COLACE) 100 MG capsule, Take 1 capsule (100 mg total) by mouth 2 (two) times daily as needed for mild constipation (while taking narcotic)., Disp: 60 capsule, Rfl: 0 .  erythromycin ophthalmic ointment, Place 1 application into the right eye at bedtime. , Disp: , Rfl:  .  gabapentin (NEURONTIN) 300 MG capsule, Take 1 capsule by mouth twice daily, Disp: 60  capsule, Rfl: 2 .  gentamicin (GARAMYCIN) 0.3 % ophthalmic solution, Place 1 drop into the right eye daily. , Disp: , Rfl:  .  insulin NPH-regular Human (NOVOLIN 70/30 RELION) (70-30) 100 UNIT/ML injection, Inject 50 units SQ before breakfast and 30-50 units before evening meal.   Injecte 50 unidades subcutaneamente dos veces al dia con el alimento y 30-50 unidades antes de el alimento de la tarde, Disp: 10 mL, Rfl: 0 .  losartan (COZAAR) 100 MG tablet, Take 1 tablet by mouth once daily, Disp: 30 tablet, Rfl: 1 .  metFORMIN (GLUCOPHAGE) 1000 MG tablet, TAKE 1 TABLET BY MOUTH TWICE DAILY WITH A MEAL (Patient taking differently: Take 1,000 mg by mouth 2 (two) times daily with a meal. ), Disp: 180 tablet, Rfl: 0 .  Omega-3 Fatty Acids (FISH OIL) 1000 MG CAPS, Take by mouth., Disp: , Rfl:  .  cyclobenzaprine (FLEXERIL) 10 MG tablet, 1 po q 8 hour prn.  Tome una tableta por boca cada 8 horas cuando sea necesario (Patient not taking: Reported on 01/06/2020), Disp: 30 tablet, Rfl: 0 .  omeprazole (PRILOSEC) 20 MG capsule, Take 1 capsule (20 mg total) by mouth daily. (Patient not taking: Reported on 01/06/2020), Disp: 30 capsule, Rfl: 1    Review of  Systems  Per HPI unless specifically indicated above     Objective:    BP 120/70   Pulse 75   Temp (!) 97.1 F (36.2 C)   Ht 4' 11.8" (1.519 m)   Wt 165 lb (74.8 kg)   LMP 02/15/2011 (Approximate)   SpO2 100%   BMI 32.44 kg/m   Wt Readings from Last 3 Encounters:  01/06/20 165 lb (74.8 kg)  12/20/19 152 lb (68.9 kg)  12/19/19 167 lb (75.8 kg)    Physical Exam Vitals reviewed.  Constitutional:      General: She is not in acute distress.    Appearance: She is well-developed. She is not ill-appearing.  HENT:     Head: Normocephalic and atraumatic.  Cardiovascular:     Rate and Rhythm: Normal rate and regular rhythm.  Pulmonary:     Effort: Pulmonary effort is normal.     Breath sounds: Normal breath sounds.  Abdominal:     General:  Bowel sounds are normal.     Palpations: Abdomen is soft. There is no mass.     Tenderness: There is generalized abdominal tenderness. There is no guarding or rebound.     Comments: Surgical sites healing without redness or signs infection.   Very mildly uncomfortable with palpation.   No rebound or guarding. No masses.  No point tenderness.    Musculoskeletal:     Cervical back: Neck supple.     Right lower leg: No edema.     Left lower leg: No edema.  Lymphadenopathy:     Cervical: No cervical adenopathy.  Skin:    General: Skin is warm and dry.  Neurological:     Mental Status: She is alert and oriented to person, place, and time.  Psychiatric:        Behavior: Behavior normal.     Results for orders placed or performed during the hospital encounter of 01/02/20  Hemoglobin A1c  Result Value Ref Range   Hgb A1c MFr Bld 7.8 (H) 4.8 - 5.6 %   Mean Plasma Glucose 177.16 mg/dL  Lipid panel  Result Value Ref Range   Cholesterol 218 (H) 0 - 200 mg/dL   Triglycerides 196 (H) <150 mg/dL   HDL 38 (L) >40 mg/dL   Total CHOL/HDL Ratio 5.7 RATIO   VLDL 39 0 - 40 mg/dL   LDL Cholesterol 141 (H) 0 - 99 mg/dL  Comprehensive metabolic panel  Result Value Ref Range   Sodium 138 135 - 145 mmol/L   Potassium 4.7 3.5 - 5.1 mmol/L   Chloride 103 98 - 111 mmol/L   CO2 27 22 - 32 mmol/L   Glucose, Bld 144 (H) 70 - 99 mg/dL   BUN 14 6 - 20 mg/dL   Creatinine, Ser 0.84 0.44 - 1.00 mg/dL   Calcium 9.2 8.9 - 10.3 mg/dL   Total Protein 7.4 6.5 - 8.1 g/dL   Albumin 3.7 3.5 - 5.0 g/dL   AST 15 15 - 41 U/L   ALT 20 0 - 44 U/L   Alkaline Phosphatase 82 38 - 126 U/L   Total Bilirubin 0.6 0.3 - 1.2 mg/dL   GFR, Estimated >60 >60 mL/min   Anion gap 8 5 - 15      Assessment & Plan:     Encounter Diagnoses  Name Primary?  Marland Kitchen Uncontrolled type 2 diabetes mellitus with hyperglycemia ([place]) Yes  . Essential hypertension   . Hyperlipidemia, unspecified hyperlipidemia type   . Obesity, unspecified  classification,  unspecified obesity type, unspecified whether serious comorbidity present   . Not proficient in [place] language   . Constipation, unspecified constipation type       -reviewed labs with pt  -pt encouraged to Increase water.  She is to use OTC  PEG/miralax to help with er constipation.  Activity/walking is encouraged  -will Increase lipitor to 80mg   -pt to Monitor bs and bring log to next appt  -pt to follow up 3-4 wk with bs log to recheck constipation and DM.  She is to contact office sooner prn

## 2020-01-06 NOTE — Patient Instructions (Addendum)
Prevencin del estreimiento tras una ciruga Preventing Constipation After Surgery El estreimiento es un problema frecuente despus de [place]. Muchos factores puede aumentar el riesgo de tener estreimiento luego de una ciruga; entre ellos:  Ciertos medicamentos, en especial, los anestsicos y ciertos analgsicos muy fuertes llamados opioides.  Sentir estrs a causa de Diplomatic Services operational officer.  Consumir alimentos distintos de los habituales.  Estar menos activo. Los sntomas de estreimiento son los siguiente:  Defecar menos de tres veces por semana.  Esforzarse mucho para defecar.  Tener heces secas, duras o de mayor tamao de lo habitual.  Sensacin de estar lleno o hinchado.  Sentir dolor en la parte inferior del abdomen.  No sentir alivio despus de defecar. Puede tomar ciertas medidas para evitar el estreimiento tras [place]. Siga estas indicaciones en su casa: Comida y bebida   Consumir alimentos con alto contenido de [place]. Entre ellos, frutas y verduras frescas, cereales integrales y frijoles.  Limitar el consumo de alimentos ricos en grasa y azcares procesados, como alimentos fritos o dulces. Estos incluyen patatas fritas, hamburguesas, galletas y dulces.  Tome un suplemento de fibras como se lo haya indicado el mdico. Si no toma un suplemento de fibras y piensa que no obtiene suficiente fibras de los alimentos, hable con el mdico sobre agregar un suplemento de fibras a su dieta.  Beba suficiente lquido para Consulting civil engineer orina de color amarillo plido.  Tome lquidos claros, especialmente, agua. Evite consumir alcohol, cafena y refrescos. Estos pueden Agricultural engineer. Actividad  Despus de la [place], retome sus actividades normales lentamente o cuando el mdico lo autorice.  Comience a caminar tan pronto como pueda. Intente caminar un poco ms [place].  Una vez que el mdico lo autorice, haga algn tipo de actividad fsica regularmente. Esto ayuda  a Engineer, civil (consulting). Deposiciones  Vaya al bao cuando sienta la necesidad de ir. No se aguante las ganas.  Pruebe a tomar algo caliente para poder defecar.  Registre la frecuencia con la que [place] el bao. Medicamentos  [employer] de venta libre y los recetados solamente como se lo haya indicado el mdico.  Hable con el mdico sobre los medicamentos que podran ayudar a Engineer, maintenance estreimiento, en especial, si tiene antecedentes de este cuadro clnico. El mdico podra sugerirle el uso de laxantes emolientes, laxantes o un suplemento de [place].  No tome medicamentos sin antes consultar con su mdico. Comunquese con un mdico si:  [place] laxantes emolientes o laxantes, y no pudo defecar en el transcurso de entre 24 y 48horas despus de tomarlos.  No defeca durante 3das.  Tiene fiebre. Solicite ayuda de inmediato si:  El estreimiento dura ms de 4 das o [place].  Observa sangre brillante en las heces.  Siente dolor en el abdomen o el recto.  Tiene clicos intestinales muy intensos.  Las heces son delgadas como un lpiz.  Baja de peso sin causa aparente. Resumen  El estreimiento es un problema frecuente despus de [place]. Muchas cosas pueden aumentar el riesgo de tener estreimiento tras [place]; por ejemplo, ciertos medicamentos, consumir alimentos diferentes de los habituales y estar menos [place].  Algunos de los sntomas del estreimiento son defecar menos de tres veces por semana, tener dificultad para defecar, sentir dolor en la parte inferior del abdomen y sentirse lleno o con meteorismo.  Para prevenir el estreimiento puede ser til seguir una dieta que tenga muchos alimentos con alto contenido de [place], Optometrist actividad fsica y tomar ciertos medicamentos,  como los laxantes emolientes y los laxantes. Esta informacin no tiene Marine scientist el consejo del mdico. Asegrese de hacerle al mdico cualquier pregunta que  tenga. Document Revised: 10/12/2016 Document Reviewed: 10/12/2016 Elsevier Patient Education  [place].

## 2020-01-07 ENCOUNTER — Other Ambulatory Visit: Payer: Self-pay

## 2020-01-07 ENCOUNTER — Other Ambulatory Visit: Payer: Self-pay | Admitting: Family Medicine

## 2020-01-07 ENCOUNTER — Telehealth (INDEPENDENT_AMBULATORY_CARE_PROVIDER_SITE_OTHER): Payer: Self-pay | Admitting: General Surgery

## 2020-01-07 DIAGNOSIS — R1011 Right upper quadrant pain: Secondary | ICD-10-CM

## 2020-01-07 DIAGNOSIS — K811 Chronic cholecystitis: Secondary | ICD-10-CM

## 2020-01-07 NOTE — Progress Notes (Signed)
[facility]  I am calling the patient for post operative evaluation. This is not a billable encounter as it is under the [place] charges for the surgery.  The patient had a laparoscopic cholecystectomy on 12/23/2019. The patient reports that she is doing well and having no pain or issues. The are tolerating a diet, having good pain control, and having regular Bms.  The incisions are healing without issues. The patient has no concerns.   Pathology: FINAL MICROSCOPIC DIAGNOSIS:   A. GALLBLADDER, CHOLECYSTECOMY:  - Mild chronic cholecystitis.   Will see the patient PRN.   Return to work 01/13/2020 without restrictions.   Curlene Labrum, MD [facility] [address] [place], [place] [address] [phone] (office)

## 2020-01-28 ENCOUNTER — Other Ambulatory Visit: Payer: Self-pay | Admitting: Physician Assistant

## 2020-01-28 ENCOUNTER — Encounter: Payer: Self-pay | Admitting: Physician Assistant

## 2020-01-28 ENCOUNTER — Encounter: Payer: Self-pay | Admitting: General Surgery

## 2020-01-28 ENCOUNTER — Ambulatory Visit: Payer: Self-pay | Admitting: Physician Assistant

## 2020-01-28 ENCOUNTER — Other Ambulatory Visit: Payer: Self-pay

## 2020-01-28 ENCOUNTER — Ambulatory Visit (INDEPENDENT_AMBULATORY_CARE_PROVIDER_SITE_OTHER): Payer: Self-pay | Admitting: General Surgery

## 2020-01-28 VITALS — BP 124/78 | HR 68 | Temp 98.5°F | Resp 14 | Ht 59.5 in | Wt 167.0 lb

## 2020-01-28 VITALS — BP 136/79 | HR 70 | Temp 96.2°F | Wt 167.0 lb

## 2020-01-28 DIAGNOSIS — K0889 Other specified disorders of teeth and supporting structures: Secondary | ICD-10-CM

## 2020-01-28 DIAGNOSIS — R1011 Right upper quadrant pain: Secondary | ICD-10-CM

## 2020-01-28 DIAGNOSIS — E1165 Type 2 diabetes mellitus with hyperglycemia: Secondary | ICD-10-CM

## 2020-01-28 DIAGNOSIS — Z789 Other specified health status: Secondary | ICD-10-CM

## 2020-01-28 MED ORDER — AMOXICILLIN 500 MG PO CAPS: 500.0000 mg | ORAL_CAPSULE | Freq: Three times a day (TID) | ORAL | 0 refills | Status: AC

## 2020-01-28 NOTE — Patient Instructions (Signed)
Muscle type pain/ strain. Take ibuprofen 800 mg every 6 hours for the next 3-4 days. Take with food. Use lidocaine patches from the pharmacy in the area as directed on the box. Will call your daughter next Tuesday to see how you are doing.  Dolor / tensin de tipo muscular. Tome ibuprofeno 800 mg cada 6 horas durante los prximos 3-4 das. Tomar con la comida. Use parches de lidocana de la farmacia en el rea como se indica en la caja. Llamar a su hija el prximo martes para ver cmo le va.

## 2020-01-28 NOTE — Progress Notes (Signed)
BP 136/79   Pulse 70   Temp (!) 96.2 F (35.7 C)   Wt 167 lb (75.8 kg)   LMP 02/15/2011 (Approximate)   SpO2 98%   BMI 32.83 kg/m    Subjective:    Patient ID: Amanda Zamora, female    DOB: May 26, 1960, 59 y.o.   MRN: [id number]  HPI: Drenda Sobecki is a 59 y.o. female presenting on 01/28/2020 for No chief complaint on file.   HPI   Pt had a negative covid 19 screening questionnaire.   Pt is in today for follow up diabetes.  She says she is having pain around her gallbladder area but she has appointment with surgeon later this morning for evaluation of that.    She is Using 70/30 insulin 50 units am and 30 units in the evening.  She is Also taking metformin bid   She is watching what she eats.   She is mostly eating vegetable soup.  She says she has Not been eating much since her surgery.  bs log:  Mornings mostly in the high 100s, afternoon and evening readings all over 200, up to 276.    Also tooth hurting since Saturday.       Relevant past medical, surgical, family and social history reviewed and updated as indicated. Interim medical history since our last visit reviewed. Allergies and medications reviewed and updated.   Current Outpatient Medications:  .  insulin NPH-regular Human (NOVOLIN 70/30 RELION) (70-30) 100 UNIT/ML injection, Inject 50 units SQ before breakfast and 30-50 units before evening meal.   Injecte 50 unidades subcutaneamente dos veces al dia con el alimento y 30-50 unidades antes de el alimento de la tarde, Disp: 10 mL, Rfl: 0 .  metFORMIN (GLUCOPHAGE) 1000 MG tablet, TAKE 1 TABLET BY MOUTH TWICE DAILY WITH A MEAL (Patient taking differently: Take 1,000 mg by mouth 2 (two) times daily with a meal.), Disp: 180 tablet, Rfl: 0 .  amoxicillin (AMOXIL) 500 MG capsule, Take 1 capsule (500 mg total) by mouth 3 (three) times daily for 10 days. Tome una tableta por boca tres veces diarias por 10 dias, Disp: 30 capsule, Rfl: 0 .  atorvastatin (LIPITOR)  80 MG tablet, Take 1 tablet (80 mg total) by mouth daily., Disp: 30 tablet, Rfl: 4 .  cyclobenzaprine (FLEXERIL) 10 MG tablet, 1 po q 8 hour prn.  Tome una tableta por boca cada 8 horas cuando sea necesario (Patient not taking: No sig reported), Disp: 30 tablet, Rfl: 0 .  diclofenac (VOLTAREN) 75 MG EC tablet, 1 po bid with food.  Tome una tableta por boca dos veces diarias con comida, Disp: 60 tablet, Rfl: 0 .  docusate sodium (COLACE) 100 MG capsule, Take 1 capsule (100 mg total) by mouth 2 (two) times daily as needed for mild constipation (while taking narcotic)., Disp: 60 capsule, Rfl: 0 .  erythromycin ophthalmic ointment, Place 1 application into the right eye at bedtime. , Disp: , Rfl:  .  gabapentin (NEURONTIN) 300 MG capsule, Take 1 capsule by mouth twice daily, Disp: 60 capsule, Rfl: 2 .  gentamicin (GARAMYCIN) 0.3 % ophthalmic solution, Place 1 drop into the right eye daily. , Disp: , Rfl:  .  losartan (COZAAR) 100 MG tablet, Take 1 tablet by mouth once daily, Disp: 30 tablet, Rfl: 1 .  Omega-3 Fatty Acids (FISH OIL) 1000 MG CAPS, Take by mouth., Disp: , Rfl:  .  omeprazole (PRILOSEC) 20 MG capsule, Take 1 capsule (20 mg total) by mouth  daily. (Patient not taking: No sig reported), Disp: 30 capsule, Rfl: 1      Review of Systems  Per HPI unless specifically indicated above     Objective:    BP 136/79   Pulse 70   Temp (!) 96.2 F (35.7 C)   Wt 167 lb (75.8 kg)   LMP 02/15/2011 (Approximate)   SpO2 98%   BMI 32.83 kg/m   Wt Readings from Last 3 Encounters:  01/28/20 167 lb (75.8 kg)  01/06/20 165 lb (74.8 kg)  12/20/19 152 lb (68.9 kg)    Physical Exam Vitals reviewed.  Constitutional:      General: She is not in acute distress.    Appearance: She is well-developed and well-nourished. She is not ill-appearing.  HENT:     Head: Normocephalic and atraumatic.     Mouth/Throat:     Comments: Pt has partial dental plate on the top.  When removed it reveals decay and  tender tooth Cardiovascular:     Rate and Rhythm: Normal rate and regular rhythm.  Pulmonary:     Effort: Pulmonary effort is normal. No respiratory distress.     Breath sounds: Normal breath sounds. No wheezing or rhonchi.  Abdominal:     Palpations: There is no hepatosplenomegaly.  Musculoskeletal:        General: No edema.     Cervical back: Neck supple.  Lymphadenopathy:     Cervical: No cervical adenopathy.  Skin:    General: Skin is warm and dry.  Neurological:     Mental Status: She is alert and oriented to person, place, and time.  Psychiatric:        Mood and Affect: Mood and affect normal.        Behavior: Behavior normal.           Assessment & Plan:     Encounter Diagnoses  Name Primary?  Marland Kitchen Uncontrolled type 2 diabetes mellitus with hyperglycemia ([place]) Yes  . Dentalgia   . Not proficient in [place] language      For tooth: Amoxil and dentist  For DM: Increase morning insulin to 60 units and keep evening dosage at 30 units.  She is to continue to monitor the bs.  She is reminded to call office for fbs < 70 or > 300.    She is to follow up 2-3 wk with bs log.  She is to contact office sooner prn

## 2020-01-28 NOTE — Progress Notes (Signed)
[facility] Note   HPI:  59 y.o. Female presents to clinic for post-op follow-up evaluation after laparoscopic cholecystectomy 11/8. She says she is having pain with twisting in the right side and is worse with movement. There is no bruising.   Review of Systems:  Pain with twisting and movement in the right flank All other review of systems: otherwise negative   Vital Signs:  BP 124/78   Pulse 68   Temp 98.5 F (36.9 C) (Oral)   Resp 14   Ht 4' 11.5" (1.511 m)   Wt 167 lb (75.8 kg)   LMP 02/15/2011 (Approximate)   SpO2 98%   BMI 33.17 kg/m    Physical Exam:  Physical Exam Cardiovascular:     Rate and Rhythm: Normal rate.  Pulmonary:     Effort: Pulmonary effort is normal.  Abdominal:     General: There is no distension.     Palpations: Abdomen is soft.     Tenderness: There is abdominal tenderness.     Comments: Right flank pain with palpation, no bruising, port sites healing without drainage or erythema   Neurological:     Mental Status: She is alert.     Assessment:  59 y.o. yo Female with muscle type pain and strain post op. This should continue to improve.   Plan:  Muscle type pain/ strain. Take ibuprofen 800 mg every 6 hours for the next 3-4 days. Take with food. Use lidocaine patches from the pharmacy in the area as directed on the box. Will call your daughter next Tuesday to see how you are doing.  Future Appointments  Date Time Provider [place]  02/04/2020 12:05 PM Virl Cagey, MD [facility] None  02/18/2020  9:00 AM Soyla Dryer, PA-C [facility] None     Curlene Labrum, MD [facility] [address] [place], [place] [address] [phone] (office)

## 2020-01-30 ENCOUNTER — Ambulatory Visit: Payer: Self-pay | Admitting: Physician Assistant

## 2020-02-04 ENCOUNTER — Telehealth: Payer: Self-pay | Admitting: General Surgery

## 2020-02-04 ENCOUNTER — Telehealth (INDEPENDENT_AMBULATORY_CARE_PROVIDER_SITE_OTHER): Payer: Self-pay | Admitting: General Surgery

## 2020-02-04 DIAGNOSIS — R1011 Right upper quadrant pain: Secondary | ICD-10-CM

## 2020-02-04 NOTE — Progress Notes (Signed)
[facility]  I am calling the patient for post operative evaluation. This is not a billable encounter as it is under the [place] charges for the surgery.    The patient presented last with with muscular type pain in her right abdomen. I recommended ibuprofen and lidocaine patches.   She had asked me to call her daughter and I left a message for Mayra asking her to call to see how her mother was doing or if any issues.   Curlene Labrum, MD [facility] [address] [place], [place] [address] [phone] (office)

## 2020-02-18 ENCOUNTER — Ambulatory Visit: Payer: Self-pay | Admitting: Physician Assistant

## 2020-02-18 ENCOUNTER — Encounter: Payer: Self-pay | Admitting: Physician Assistant

## 2020-02-18 ENCOUNTER — Other Ambulatory Visit: Payer: Self-pay

## 2020-02-18 VITALS — BP 118/70 | HR 90 | Temp 96.9°F | Ht 59.5 in | Wt 172.0 lb

## 2020-02-18 DIAGNOSIS — R1011 Right upper quadrant pain: Secondary | ICD-10-CM

## 2020-02-18 DIAGNOSIS — K0889 Other specified disorders of teeth and supporting structures: Secondary | ICD-10-CM

## 2020-02-18 DIAGNOSIS — Z789 Other specified health status: Secondary | ICD-10-CM

## 2020-02-18 DIAGNOSIS — E1165 Type 2 diabetes mellitus with hyperglycemia: Secondary | ICD-10-CM

## 2020-02-18 DIAGNOSIS — R109 Unspecified abdominal pain: Secondary | ICD-10-CM

## 2020-02-18 LAB — POCT URINALYSIS DIPSTICK
Bilirubin, UA: NEGATIVE
Blood, UA: NEGATIVE
Glucose, UA: NEGATIVE
Ketones, UA: NEGATIVE
Leukocytes, UA: NEGATIVE
Nitrite, UA: NEGATIVE
Protein, UA: NEGATIVE
Spec Grav, UA: 1.02 (ref 1.010–1.025)
Urobilinogen, UA: 0.2 E.U./dL
pH, UA: 6 (ref 5.0–8.0)

## 2020-02-18 NOTE — Progress Notes (Signed)
LMP 02/15/2011 (Approximate)    Subjective:    Patient ID: Amanda Zamora, female    DOB: Aug 09, 1960, 60 y.o.   MRN: [id number]  HPI: Amanda Zamora is a 60 y.o. female presenting on 02/18/2020 for No chief complaint on file.   HPI     Pt had a negative covid 19 screening questionnaire.     Pt is here today to review bs log but she has many multiple other complaints today.  At her appt 1 month ago, her insulin was increased to 60u am and 30 u pm.  She was given rx amoxil and dental list at that appt as well.    Chief Complaint  Patient presents with  . Diabetes    Pt was approved with [facility] and would like rx sent there  . Flank Pain    On R side of torso which radiates towards lower back. Pt has been hurting for about one month. Pt had been using lidocaine patches and took 800mg  ibu which helped for a while but patches no longer help.  . Dental Pain    L upper side pt was given antibiotic which she feels helped. Pt states she feels like it hurts and radiates to her L ear and L eye, and L side of throat.    She only has 13 bs readings on her log since she was here last on 01/28/20.  Range 123-306.  She has sometimes been using 70u insulin in the morning and other times she is using 60 units.   She says she has been Hurting lower back for a month.  It started after her ssurgery.  She had f/u with dr Constance Haw 12/21.   Dr Constance Haw told her to use IBU and lidocaine patches.    She says pain where she had before the surgery is gone and now pain is in other place.    She had laparoscopic cholecystectomy In November.  No dysuria.           Relevant past medical, surgical, family and social history reviewed and updated as indicated. Interim medical history since our last visit reviewed. Allergies and medications reviewed and updated.   Current Outpatient Medications:  .  atorvastatin (LIPITOR) 80 MG tablet, Take 1 tablet (80 mg total) by mouth daily., Disp: 30  tablet, Rfl: 4 .  cyclobenzaprine (FLEXERIL) 10 MG tablet, 1 po q 8 hour prn.  Tome una tableta por boca cada 8 horas cuando sea necesario (Patient taking differently: 1 po q 8 hour prn.  Tome una tableta por boca cada 8 horas cuando sea necesario), Disp: 30 tablet, Rfl: 0 .  diclofenac (VOLTAREN) 75 MG EC tablet, 1 po bid with food.  Tome una tableta por boca dos veces diarias con comida, Disp: 60 tablet, Rfl: 0 .  docusate sodium (COLACE) 100 MG capsule, Take 1 capsule (100 mg total) by mouth 2 (two) times daily as needed for mild constipation (while taking narcotic)., Disp: 60 capsule, Rfl: 0 .  erythromycin ophthalmic ointment, Place 1 application into the right eye at bedtime. , Disp: , Rfl:  .  gabapentin (NEURONTIN) 300 MG capsule, Take 1 capsule by mouth twice daily, Disp: 60 capsule, Rfl: 2 .  gentamicin (GARAMYCIN) 0.3 % ophthalmic solution, Place 1 drop into the right eye daily. , Disp: , Rfl:  .  insulin NPH-regular Human (NOVOLIN 70/30 RELION) (70-30) 100 UNIT/ML injection, Inject 50 units SQ before breakfast and 30-50 units before evening meal.   Injecte 50  unidades subcutaneamente dos veces al dia con el alimento y 30-50 unidades antes de el alimento de la tarde (Patient taking differently: Inject 70 units SQ before breakfast and 20-30 units before evening meal.   Injecte 70 unidades subcutaneamente dos veces al dia con el alimento y 20-30 unidades antes de el alimento de la tarde), Disp: 10 mL, Rfl: 0 .  losartan (COZAAR) 100 MG tablet, Take 1 tablet by mouth once daily, Disp: 30 tablet, Rfl: 0 .  metFORMIN (GLUCOPHAGE) 1000 MG tablet, TAKE 1 TABLET BY MOUTH TWICE DAILY WITH A MEAL (Patient taking differently: Take 1,000 mg by mouth 2 (two) times daily with a meal.), Disp: 180 tablet, Rfl: 0 .  Omega-3 Fatty Acids (FISH OIL) 1000 MG CAPS, Take by mouth., Disp: , Rfl:  .  omeprazole (PRILOSEC) 20 MG capsule, Take 1 capsule (20 mg total) by mouth daily., Disp: 30 capsule, Rfl: 1   Review  of Systems  Per HPI unless specifically indicated above     Objective:      Vitals:   02/18/20 1351  BP: 118/70  Pulse: 90  Temp: (!) 96.9 F (36.1 C)  SpO2: 98%     LMP 02/15/2011 (Approximate)   Wt Readings from Last 3 Encounters:  01/28/20 167 lb (75.8 kg)  01/28/20 167 lb (75.8 kg)  01/06/20 165 lb (74.8 kg)    Physical Exam Constitutional:      General: She is not in acute distress.    Appearance: She is obese. She is not ill-appearing or toxic-appearing.  HENT:     Head: Normocephalic and atraumatic.  Pulmonary:     Effort: Pulmonary effort is normal. No respiratory distress.     Breath sounds: Normal breath sounds. No wheezing or rhonchi.  Abdominal:     General: Bowel sounds are normal.     Tenderness: There is no guarding or rebound.    Musculoskeletal:     Right shoulder: Normal range of motion.     Left shoulder: Normal range of motion.     Cervical back: Neck supple.     Thoracic back: Tenderness present. No swelling. Normal range of motion.       Back:     Right lower leg: No edema.     Left lower leg: No edema.     Comments: Increase in pain with moving around, particularly with lifting arms over head and twisting torso  Neurological:     Mental Status: She is alert and oriented to person, place, and time.  Psychiatric:        Attention and Perception: Attention normal.        Speech: Speech normal.        Behavior: Behavior normal. Behavior is cooperative.            Assessment & Plan:    Encounter Diagnoses  Name Primary?  Marland Kitchen Uncontrolled type 2 diabetes mellitus with hyperglycemia ([place]) Yes  . Flank pain   . Dentalgia   . Abdominal pain, right upper quadrant   . Not proficient in English language         - UA in office unremarkable.  Will check cxr that she can get done today if she wants.  She will be called with results.  She is encouraged to use IBU or APAP , lidocaine patches, ice/heat.   Discussed with pt that pain  appears musculoskeletal  -She is on dental list  -pt to follow up 3 months for dm. She is to  contact office sooner prn

## 2020-02-21 ENCOUNTER — Ambulatory Visit (HOSPITAL_COMMUNITY)
Admission: RE | Admit: 2020-02-21 | Discharge: 2020-02-21 | Disposition: A | Discharge status: Home / Self Care | Payer: Self-pay | Source: Ambulatory Visit | Attending: Physician Assistant | Admitting: Physician Assistant

## 2020-02-21 ENCOUNTER — Other Ambulatory Visit: Payer: Self-pay

## 2020-02-21 DIAGNOSIS — R10A0 Flank pain, unspecified side: Secondary | ICD-10-CM

## 2020-02-21 DIAGNOSIS — R109 Unspecified abdominal pain: Secondary | ICD-10-CM | POA: Insufficient documentation

## 2020-03-09 ENCOUNTER — Telehealth: Payer: Self-pay | Admitting: Student

## 2020-03-09 ENCOUNTER — Other Ambulatory Visit: Payer: Self-pay | Admitting: Physician Assistant

## 2020-03-09 MED ORDER — IRBESARTAN 300 MG PO TABS: 300.0000 mg | ORAL_TABLET | Freq: Every day | ORAL | 3 refills | Status: DC

## 2020-03-09 MED ORDER — ATORVASTATIN CALCIUM 80 MG PO TABS
80.0000 mg | ORAL_TABLET | Freq: Every day | ORAL | 1 refills | Status: DC
Start: 2020-03-09 — End: 2020-04-01

## 2020-03-09 MED ORDER — METFORMIN HCL 1000 MG PO TABS
ORAL_TABLET | ORAL | 0 refills | Status: DC
Start: 2020-03-09 — End: 2020-06-11

## 2020-03-09 MED ORDER — OMEPRAZOLE 20 MG PO CPDR
20.0000 mg | DELAYED_RELEASE_CAPSULE | Freq: Every day | ORAL | 1 refills | Status: DC
Start: 2020-03-09 — End: 2020-06-11

## 2020-03-09 NOTE — Telephone Encounter (Signed)
Pt called requesting refill on losartan and requesting meds to be sent to [facility]. Losartan is currently on back order ad unavailable at [facility] and [place]. rx for irbesartan in place of losartan sent to [facility] as it is unavailable at [facility]. All other meds sent to [facility]. Pt verbalized understanding.  Pt also reports high blood sugar value at 333 yesterday after administering her insulin after her breakfast which consisted of very small amount of spaghetti. Pt states she has been feeling drowsy and with decreased appetite. Pt admits to skipping meals. Pt states her blood sugar today was 270 after having a cup of coffee.  PA advised for pt to monitor her fasting blood sugar values and note how many units she is using at each dose. Pt is to bring blood sugar log at next appointment. Pt is to no skip meals. Pt is to notify the office if she cotintues to have high fasting blood sugar values (>300) so she can be scheduled to come in for evaluation. Pt verbalized understanding.

## 2020-03-31 ENCOUNTER — Emergency Department (HOSPITAL_COMMUNITY): Payer: Self-pay

## 2020-03-31 ENCOUNTER — Encounter (HOSPITAL_COMMUNITY): Payer: Self-pay | Admitting: *Deleted

## 2020-03-31 ENCOUNTER — Telehealth: Payer: Self-pay | Admitting: Physician Assistant

## 2020-03-31 ENCOUNTER — Observation Stay (HOSPITAL_COMMUNITY)
Admission: EM | Admit: 2020-03-31 | Discharge: 2020-04-01 | Disposition: A | Discharge status: Home / Self Care | Payer: Self-pay | Attending: Internal Medicine | Admitting: Internal Medicine

## 2020-03-31 DIAGNOSIS — R61 Generalized hyperhidrosis: Secondary | ICD-10-CM

## 2020-03-31 DIAGNOSIS — K219 Gastro-esophageal reflux disease without esophagitis: Secondary | ICD-10-CM

## 2020-03-31 DIAGNOSIS — R1011 Right upper quadrant pain: Secondary | ICD-10-CM

## 2020-03-31 DIAGNOSIS — Z7984 Long term (current) use of oral hypoglycemic drugs: Secondary | ICD-10-CM | POA: Insufficient documentation

## 2020-03-31 DIAGNOSIS — N179 Acute kidney failure, unspecified: Secondary | ICD-10-CM

## 2020-03-31 DIAGNOSIS — R072 Precordial pain: Secondary | ICD-10-CM | POA: Diagnosis present

## 2020-03-31 DIAGNOSIS — R109 Unspecified abdominal pain: Secondary | ICD-10-CM

## 2020-03-31 DIAGNOSIS — I10 Essential (primary) hypertension: Secondary | ICD-10-CM | POA: Diagnosis present

## 2020-03-31 DIAGNOSIS — E1140 Type 2 diabetes mellitus with diabetic neuropathy, unspecified: Secondary | ICD-10-CM | POA: Diagnosis present

## 2020-03-31 DIAGNOSIS — E782 Mixed hyperlipidemia: Secondary | ICD-10-CM

## 2020-03-31 DIAGNOSIS — Z79899 Other long term (current) drug therapy: Secondary | ICD-10-CM | POA: Insufficient documentation

## 2020-03-31 DIAGNOSIS — D72829 Elevated white blood cell count, unspecified: Secondary | ICD-10-CM

## 2020-03-31 DIAGNOSIS — Z20822 Contact with and (suspected) exposure to covid-19: Secondary | ICD-10-CM | POA: Insufficient documentation

## 2020-03-31 DIAGNOSIS — E119 Type 2 diabetes mellitus without complications: Secondary | ICD-10-CM

## 2020-03-31 DIAGNOSIS — E785 Hyperlipidemia, unspecified: Secondary | ICD-10-CM | POA: Diagnosis present

## 2020-03-31 DIAGNOSIS — E669 Obesity, unspecified: Secondary | ICD-10-CM | POA: Diagnosis present

## 2020-03-31 DIAGNOSIS — E162 Hypoglycemia, unspecified: Secondary | ICD-10-CM | POA: Diagnosis present

## 2020-03-31 DIAGNOSIS — Z794 Long term (current) use of insulin: Secondary | ICD-10-CM | POA: Insufficient documentation

## 2020-03-31 DIAGNOSIS — E11649 Type 2 diabetes mellitus with hypoglycemia without coma: Principal | ICD-10-CM | POA: Insufficient documentation

## 2020-03-31 LAB — CBG MONITORING, ED
Glucose-Capillary: 187 mg/dL — ABNORMAL HIGH (ref 70–99)
Glucose-Capillary: 254 mg/dL — ABNORMAL HIGH (ref 70–99)
Glucose-Capillary: 33 mg/dL — CL (ref 70–99)
Glucose-Capillary: 85 mg/dL (ref 70–99)
Glucose-Capillary: 60 mg/dL — ABNORMAL LOW (ref 70–99)
Glucose-Capillary: 69 mg/dL — ABNORMAL LOW (ref 70–99)

## 2020-03-31 LAB — CBC
HCT: 38.5 % (ref 36.0–46.0)
Hemoglobin: 12.5 g/dL (ref 12.0–15.0)
MCH: 29.4 pg (ref 26.0–34.0)
MCHC: 32.5 g/dL (ref 30.0–36.0)
MCV: 90.6 fL (ref 80.0–100.0)
Platelets: 333 10*3/uL (ref 150–400)
RBC: 4.25 MIL/uL (ref 3.87–5.11)
RDW: 14 % (ref 11.5–15.5)
WBC: 12.5 10*3/uL — ABNORMAL HIGH (ref 4.0–10.5)
nRBC: 0 % (ref 0.0–0.2)

## 2020-03-31 LAB — BASIC METABOLIC PANEL
Anion gap: 13 (ref 5–15)
BUN: 29 mg/dL — ABNORMAL HIGH (ref 6–20)
CO2: 22 mmol/L (ref 22–32)
Calcium: 10.5 mg/dL — ABNORMAL HIGH (ref 8.9–10.3)
Chloride: 98 mmol/L (ref 98–111)
Creatinine, Ser: 1.37 mg/dL — ABNORMAL HIGH (ref 0.44–1.00)
GFR, Estimated: 45 mL/min — ABNORMAL LOW (ref >60)
Glucose, Bld: 114 mg/dL — ABNORMAL HIGH (ref 70–99)
Potassium: 4.6 mmol/L (ref 3.5–5.1)
Sodium: 133 mmol/L — ABNORMAL LOW (ref 135–145)

## 2020-03-31 LAB — HEPATIC FUNCTION PANEL
ALT: 29 U/L (ref 0–44)
AST: 47 U/L — ABNORMAL HIGH (ref 15–41)
Albumin: 4.7 g/dL (ref 3.5–5.0)
Alkaline Phosphatase: 76 U/L (ref 38–126)
Bilirubin, Direct: 0.1 mg/dL (ref 0.0–0.2)
Indirect Bilirubin: 0.6 mg/dL (ref 0.3–0.9)
Total Bilirubin: 0.7 mg/dL (ref 0.3–1.2)
Total Protein: 8.7 g/dL — ABNORMAL HIGH (ref 6.5–8.1)

## 2020-03-31 LAB — POC SARS CORONAVIRUS 2 AG -  ED: SARS Coronavirus 2 Ag: NEGATIVE

## 2020-03-31 LAB — POC URINE PREG, ED: Preg Test, Ur: NEGATIVE

## 2020-03-31 LAB — LIPASE, BLOOD: Lipase: 29 U/L (ref 11–51)

## 2020-03-31 LAB — GLUCOSE, CAPILLARY: Glucose-Capillary: 165 mg/dL — ABNORMAL HIGH (ref 70–99)

## 2020-03-31 LAB — TROPONIN I (HIGH SENSITIVITY)
Troponin I (High Sensitivity): <2 ng/L (ref <18)
Troponin I (High Sensitivity): 3 ng/L (ref <18)

## 2020-03-31 MED ORDER — DEXTROSE 50 % IV SOLN: 1.0000 | Freq: Once | INTRAVENOUS | Status: AC

## 2020-03-31 MED ORDER — ENOXAPARIN SODIUM 40 MG/0.4ML ~~LOC~~ SOLN
40.0000 mg | Freq: Every day | SUBCUTANEOUS | Status: DC
  Administered 2020-03-31: 40 mg via SUBCUTANEOUS

## 2020-03-31 MED ORDER — DEXTROSE 50 % IV SOLN
INTRAVENOUS | Status: AC
  Administered 2020-03-31: 50 mL via INTRAVENOUS
  Filled 2020-03-31: qty 50

## 2020-03-31 MED ORDER — MORPHINE SULFATE (PF) 2 MG/ML IV SOLN
2.0000 mg | INTRAVENOUS | Status: DC | PRN
  Administered 2020-04-01 (×2): 2 mg via INTRAVENOUS
  Filled 2020-03-31 (×2): qty 1

## 2020-03-31 MED ORDER — IOHEXOL 300 MG/ML  SOLN
75.0000 mL | Freq: Once | INTRAMUSCULAR | Status: AC | PRN
  Administered 2020-03-31: 75 mL via INTRAVENOUS

## 2020-03-31 MED ORDER — SODIUM CHLORIDE 0.9 % IV BOLUS
1000.0000 mL | Freq: Once | INTRAVENOUS | Status: AC
Start: 2020-03-31 — End: 2020-03-31
  Administered 2020-03-31: 1000 mL via INTRAVENOUS

## 2020-03-31 MED ORDER — DEXTROSE 5 % IV SOLN: Freq: Once | INTRAVENOUS | Status: AC

## 2020-03-31 MED ORDER — MORPHINE SULFATE (PF) 4 MG/ML IV SOLN
4.0000 mg | Freq: Once | INTRAVENOUS | Status: AC
  Administered 2020-03-31: 4 mg via INTRAVENOUS
  Filled 2020-03-31: qty 1

## 2020-03-31 MED ORDER — IOHEXOL 350 MG/ML SOLN
50.0000 mL | Freq: Once | INTRAVENOUS | Status: AC | PRN
  Administered 2020-03-31: 50 mL via INTRAVENOUS

## 2020-03-31 MED ORDER — SODIUM CHLORIDE 0.9 % IV BOLUS
1000.0000 mL | Freq: Once | INTRAVENOUS | Status: AC
  Administered 2020-03-31: 1000 mL via INTRAVENOUS

## 2020-03-31 NOTE — ED Provider Notes (Signed)
[facility] EMERGENCY DEPARTMENT Provider Note   CSN: [id number] Arrival date & time: 03/31/20  1542     History Chief Complaint  Patient presents with  . Chest Pain    Amanda Zamora is a 60 y.o. female with past medical history of GERD, pyelonephritis, type 2 diabetes, chronic cholecystitis with cholestectomy, hypertension that presents emerge department today for chest pain and abdominal pain.  Patients chart states that she needs Spanish intrerpter, however patient states that she does not want Spanish interpreter and is able to speak [place].  Patient states that she has been having chest pain that spans throughout her chest from the right side to the middle to the left side for the past 2 days.  States that she has been having pain in her abdomen as well. Denies any back pain.  States that pain is most likely in the  right upper quadrant, patient does have history of chronic cholecystitis.  States that she has been taking anything of this.  States that chest pain is severe, is constant and feels like a throbbing sensation.  No SOB, leg swelling, orthopnea. Denies any trauma.  Denies any radiation anywhere.  Denies any nausea or vomiting.  Denies any fevers or diarrhea.  Denies cough or any URI symptoms.  States that she has been taking her medications, no blood thinners.  Denies any cardiac history.  No other complaints at this time. HPI     Past Medical History:  Diagnosis Date  . Detached retina   . GERD (gastroesophageal reflux disease)   . History of pyelonephritis   . PONV (postoperative nausea and vomiting)   . Type 2 diabetes mellitus [facility])     Patient Active Problem List   Diagnosis Date Noted  . Chronic cholecystitis 12/19/2019  . Abdominal pain, right upper quadrant   . Abnormal mammogram 10/10/2018  . Type 2 diabetes mellitus with diabetic neuropathy, unspecified ([place]) 08/01/2018  . Not proficient in [place] language 08/01/2018  . Screening for colon cancer  08/01/2018  . Hyperlipidemia 03/10/2015  . HTN (hypertension) 03/10/2015  . Obesity, unspecified 03/10/2015  . Esophageal reflux 03/10/2015  . Precordial pain 03/28/2013  . Type 2 diabetes mellitus ([place]) 03/28/2013    Past Surgical History:  Procedure Laterality Date  . CHOLECYSTECTOMY N/A 12/23/2019   Procedure: LAPAROSCOPIC CHOLECYSTECTOMY;  Surgeon: Virl Cagey, MD;  Location: [facility];  Service: General;  Laterality: N/A;  . EYE SURGERY Right    retina  . TUBAL LIGATION       OB History   No obstetric history on file.     Family History  Problem Relation Age of Onset  . Breast cancer Sister   . Diabetes Sister     Social History   Tobacco Use  . Smoking status: Never Smoker  . Smokeless tobacco: Never Used  Vaping Use  . Vaping Use: Never used  Substance Use Topics  . Alcohol use: Not Currently    Comment: previously would drink occ. beer last drink 06/2017  . Drug use: No    Home Medications Prior to Admission medications   Medication Sig Start Date End Date Taking? Authorizing Provider  atorvastatin (LIPITOR) 80 MG tablet Take 1 tablet (80 mg total) by mouth daily. Tome una tableta por boca diaria 03/09/20   Soyla Dryer, PA-C  cyclobenzaprine (FLEXERIL) 10 MG tablet 1 po q 8 hour prn.  Tome una tableta por boca cada 8 horas cuando sea necesario Patient taking differently: 1 po q 8  hour prn.  Tome una tableta por boca cada 8 horas cuando sea necesario 11/28/18   Soyla Dryer, PA-C  diclofenac (VOLTAREN) 75 MG EC tablet 1 po bid with food.  Tome una tableta por boca dos veces diarias con comida 01/06/20   Soyla Dryer, PA-C  docusate sodium (COLACE) 100 MG capsule Take 1 capsule (100 mg total) by mouth 2 (two) times daily as needed for mild constipation (while taking narcotic). 12/23/19 12/22/20  Virl Cagey, MD  erythromycin ophthalmic ointment Place 1 application into the right eye at bedtime.  10/28/19   [provider]   gabapentin (NEURONTIN) 300 MG capsule Take 1 capsule by mouth twice daily 07/20/19   Soyla Dryer, PA-C  gentamicin (GARAMYCIN) 0.3 % ophthalmic solution Place 1 drop into the right eye daily.  12/02/19   [provider]  insulin NPH-regular Human (NOVOLIN 70/30 RELION) (70-30) 100 UNIT/ML injection Inject 50 units SQ before breakfast and 30-50 units before evening meal.   Injecte 50 unidades subcutaneamente dos veces al dia con el alimento y 30-50 unidades antes de el alimento de la tarde Patient taking differently: Inject 70 units SQ before breakfast and 20-30 units before evening meal.   Injecte 70 unidades subcutaneamente dos veces al dia con el alimento y 20-30 unidades antes de el alimento de la tarde 10/24/19   Soyla Dryer, PA-C  irbesartan (AVAPRO) 300 MG tablet Take 1 tablet (300 mg total) by mouth daily. Tome una tableta por boca diaria 03/09/20   Soyla Dryer, PA-C  losartan (COZAAR) 100 MG tablet Take 1 tablet by mouth once daily 01/28/20   Soyla Dryer, PA-C  metFORMIN (GLUCOPHAGE) 1000 MG tablet TAKE 1 TABLET BY MOUTH TWICE DAILY WITH A MEAL.   Tome una tableta por boca dos veces diarias con comida 03/09/20   Soyla Dryer, PA-C  Omega-3 Fatty Acids (FISH OIL) 1000 MG CAPS Take by mouth.    [provider]  omeprazole (PRILOSEC) 20 MG capsule Take 1 capsule (20 mg total) by mouth daily. Tome una tableta por boca diaria 03/09/20   Soyla Dryer, PA-C    Allergies    Patient has no known allergies.  Review of Systems   Review of Systems  Constitutional: Negative for chills, diaphoresis, fatigue and fever.  HENT: Negative for congestion, sore throat and trouble swallowing.   Eyes: Negative for pain and visual disturbance.  Respiratory: Negative for cough, shortness of breath and wheezing.   Cardiovascular: Positive for chest pain. Negative for palpitations and leg swelling.  Gastrointestinal: Positive for abdominal distention. Negative for abdominal  pain, diarrhea, nausea and vomiting.  Genitourinary: Negative for difficulty urinating.  Musculoskeletal: Negative for back pain, neck pain and neck stiffness.  Skin: Negative for pallor.  Neurological: Negative for dizziness, speech difficulty, weakness and headaches.  Psychiatric/Behavioral: Negative for confusion.    Physical Exam Updated Vital Signs BP 138/78   Pulse 60   Temp 97.7 F (36.5 C) (Oral)   Resp 13   LMP 02/15/2011 (Approximate)   SpO2 100%   Physical Exam Constitutional:      General: She is not in acute distress.    Appearance: Normal appearance. She is not ill-appearing, toxic-appearing or diaphoretic.  HENT:     Mouth/Throat:     Mouth: Mucous membranes are moist.     Pharynx: Oropharynx is clear.  Eyes:     General: No scleral icterus.    Extraocular Movements: Extraocular movements intact.     Pupils: Pupils are equal, round,  and reactive to light.  Cardiovascular:     Rate and Rhythm: Normal rate and regular rhythm.     Pulses: Normal pulses.     Heart sounds: Normal heart sounds.  Pulmonary:     Effort: Pulmonary effort is normal. No respiratory distress.     Breath sounds: Normal breath sounds. No stridor. No wheezing, rhonchi or rales.  Chest:     Chest wall: No tenderness.       Comments: Patient with tenderness to entire chest, more concentrated to left upper chest and right upper quadrant of abdomen. Abdominal:     General: Abdomen is flat. There is no distension.     Palpations: Abdomen is soft.     Tenderness: There is abdominal tenderness in the right upper quadrant and epigastric area. There is no right CVA tenderness, left CVA tenderness, guarding or rebound. Positive signs include Murphy's sign. Negative signs include Rovsing's sign, McBurney's sign and obturator sign.    Musculoskeletal:        General: No swelling or tenderness. Normal range of motion.     Cervical back: Normal range of motion and neck supple. No rigidity.      Right lower leg: No edema.     Left lower leg: No edema.  Skin:    General: Skin is warm and dry.     Capillary Refill: Capillary refill takes less than 2 seconds.     Coloration: Skin is not pale.  Neurological:     General: No focal deficit present.     Mental Status: She is alert and oriented to person, place, and time.  Psychiatric:        Mood and Affect: Mood normal.        Behavior: Behavior normal.     ED Results / Procedures / Treatments   Labs (all labs ordered are listed, but only abnormal results are displayed) Labs Reviewed  BASIC METABOLIC PANEL - Abnormal; Notable for the following components:      Result Value   Sodium 133 (*)    Glucose, Bld 114 (*)    BUN 29 (*)    Creatinine, Ser 1.37 (*)    Calcium 10.5 (*)    GFR, Estimated 45 (*)    All other components within normal limits  CBC - Abnormal; Notable for the following components:   WBC 12.5 (*)    All other components within normal limits  HEPATIC FUNCTION PANEL - Abnormal; Notable for the following components:   Total Protein 8.7 (*)    AST 47 (*)    All other components within normal limits  CBG MONITORING, ED - Abnormal; Notable for the following components:   Glucose-Capillary 33 (*)    All other components within normal limits  CBG MONITORING, ED - Abnormal; Notable for the following components:   Glucose-Capillary 254 (*)    All other components within normal limits  CBG MONITORING, ED - Abnormal; Notable for the following components:   Glucose-Capillary 187 (*)    All other components within normal limits  CBG MONITORING, ED - Abnormal; Notable for the following components:   Glucose-Capillary 69 (*)    All other components within normal limits  LIPASE, BLOOD  POC URINE PREG, ED  POC SARS CORONAVIRUS 2 AG -  ED  CBG MONITORING, ED  TROPONIN I (HIGH SENSITIVITY)  TROPONIN I (HIGH SENSITIVITY)    EKG EKG Interpretation  Date/Time:  Tuesday March 31 2020 15:46:52 EST Ventricular  Rate:  89 PR  Interval:  122 QRS Duration: 70 QT Interval:  354 QTC Calculation: 430 R Axis:   101 Text Interpretation: Normal sinus rhythm Rightward axis Borderline ECG Confirmed by Milton Ferguson [phone]) on 03/31/2020 7:15:55 PM   Radiology DG Chest 2 View  Result Date: 03/31/2020 CLINICAL DATA:  Chest pain EXAM: CHEST - 2 VIEW COMPARISON:  02/21/2020 FINDINGS: Low lung volumes. No focal opacity or pleural effusion. Normal cardiomediastinal silhouette. No pneumothorax. IMPRESSION: No active cardiopulmonary disease. Electronically Signed   By: Donavan Foil M.D.   On: 03/31/2020 17:02   CT Abdomen Pelvis W Contrast  Result Date: 03/31/2020 CLINICAL DATA:  Cholelithiasis chest pain EXAM: CT ABDOMEN AND PELVIS WITH CONTRAST TECHNIQUE: Multidetector CT imaging of the abdomen and pelvis was performed using the standard protocol following bolus administration of intravenous contrast. CONTRAST:  72mL OMNIPAQUE IOHEXOL 300 MG/ML  SOLN COMPARISON:  Ultrasound 12/04/2019, CT 12/04/2019 FINDINGS: Lower chest: Lung bases demonstrate no acute consolidation or effusion. Normal cardiac size. Hepatobiliary: Status post cholecystectomy. Hepatic steatosis. No focal hepatic abnormality or biliary dilatation Pancreas: Unremarkable. No pancreatic ductal dilatation or surrounding inflammatory changes. Spleen: Normal in size without focal abnormality. Adrenals/Urinary Tract: Adrenal glands are normal. Cyst in the mid right kidney. No hydronephrosis. Distended urinary bladder. Stomach/Bowel: Stomach is within normal limits. Appendix appears normal. Small stones in the appendix but no inflammatory change. No evidence of bowel wall thickening, distention, or inflammatory changes. Vascular/Lymphatic: Mild aortic atherosclerosis. No aneurysm. No suspicious nodes. Reproductive: Uterus and bilateral adnexa are unremarkable. Other: Negative for free air or free fluid Musculoskeletal: Trace anterolisthesis L4 on L5. No acute or  suspicious osseous abnormality IMPRESSION: 1. No CT evidence for acute intra-abdominal or pelvic abnormality. 2. Hepatic steatosis.  Status post cholecystectomy. Aortic Atherosclerosis (ICD10-I70.0). Electronically Signed   By: Donavan Foil M.D.   On: 03/31/2020 18:19   CT ANGIO CHEST AORTA W/CM &/OR WO/CM  Result Date: 03/31/2020 CLINICAL DATA:  Chest pain and abdominal pain. Suspicious for aortic dissection. EXAM: CT ANGIOGRAPHY CHEST WITH CONTRAST TECHNIQUE: Multidetector CT imaging of the chest was performed using the standard protocol during bolus administration of intravenous contrast. Multiplanar CT image reconstructions and MIPs were obtained to evaluate the vascular anatomy. CONTRAST:  64mL OMNIPAQUE IOHEXOL 350 MG/ML SOLN COMPARISON:  April 11, 2018 FINDINGS: Cardiovascular: The thoracic aorta is normal in appearance without evidence of aneurysmal dilatation or dissection. The pulmonary arteries are markedly limited in evaluation secondary to suboptimal opacification with intravenous contrast. Normal heart size with moderate severity coronary artery calcification. No pericardial effusion. Mediastinum/Nodes: No enlarged mediastinal, hilar, or axillary lymph nodes. Thyroid gland, trachea, and esophagus demonstrate no significant findings. Lungs/Pleura: Very mild atelectasis is seen within the bilateral lung bases. There is no evidence of a pleural effusion or pneumothorax. Upper Abdomen: There is diffuse fatty infiltration of the liver parenchyma. Multiple surgical clips are seen within the gallbladder fossa. Musculoskeletal: Multilevel degenerative changes are seen throughout the thoracic spine. Review of the MIP images confirms the above findings. IMPRESSION: 1. No evidence of aortic aneurysm or dissection. 2. Limited evaluation of the pulmonary arteries, as described above, as a result, pulmonary embolism cannot be excluded. 3. Fatty liver. 4. Evidence of prior cholecystectomy. Electronically  Signed   By: Virgina Norfolk M.D.   On: 03/31/2020 19:44    Procedures Procedures   Medications Ordered in ED Medications  morphine 4 MG/ML injection 4 mg (4 mg Intravenous Given 03/31/20 1835)  sodium chloride 0.9 % bolus 1,000 mL (0 mLs Intravenous Stopped 03/31/20  2014)  iohexol (OMNIPAQUE) 300 MG/ML solution 75 mL (75 mLs Intravenous Contrast Given 03/31/20 1749)  dextrose 50 % solution 50 mL (50 mLs Intravenous Given 03/31/20 1853)  sodium chloride 0.9 % bolus 1,000 mL (1,000 mLs Intravenous New Bag/Given 03/31/20 1939)  iohexol (OMNIPAQUE) 350 MG/ML injection 50 mL (50 mLs Intravenous Contrast Given 03/31/20 1927)  dextrose 5 % solution ( Intravenous New Bag/Given 03/31/20 2055)    ED Course  I have reviewed the triage vital signs and the nursing notes.  Pertinent labs & imaging results that were available during my care of the patient were reviewed by me and considered in my medical decision making (see chart for details).    MDM Rules/Calculators/A&P                         Amanda Zamora is a 60 y.o. female with past medical history of GERD, pyelonephritis, type 2 diabetes, chronic cholecystitis, hypertension that presents emerge department today for chest pain and abdominal pain.  Patient has pain all over her chest and abdomen, will obtain broad work-up at this time.  Currently hemodynamically stable.  I think this is most likely abdominal pain since patient is more tender in this area, however will obtain basic work-up at this time.  EKG interpreted with no signs of ischemia.  Chest x-ray interpreted without any signs of acute cardiopulmonary disease. Chest pain appears more musculoskeletal.   650 Was called into patient's room, patient was diaphoretic and acting very sleepy.  Sugar was noted to be 33, patient does take insulin.  Was given D50, patient returned back to baseline, sugar 257.  Will begin every CBG checks.  Work-up today so far with small leukocytosis of 12.5,  first troponin less than 2.  Normal lipase.  Covid negative.  BMP with electrolyte derangements of creatinine of 1.37 with BUN of 29, appears as patient does have an AKI with patient's baseline creatinine around 0.8.  IV fluids initiated.  CT abdomen pelvis without any acute abnormality, was able to speak to radiology tech who spoke to radiologist who does not have any concerns for abdominal or pelvic dissection.  CT angio chest ordered to rule out chest dissection, this was negative. No concerns for PE.  Unsure at this time why patient is in pain, however waiting on second troponin, unlikley ACS since chest pain appears more MSK. Morphine did help with pain.   Upon reevaluation, have been getting 30-minute CBG checks.  Patient appears slightly drowsy again, CBG 69 after patient had been given amp of D50 less than 45 minutes ago.  Patient is on 70/30 NPH regular and Metformin in regards to medication.  States that she did take her insulin this morning.  Has never had any issues with her blood sugar.  Will place on D5 drip at this time.  Patient need be admitted for observation for her hypoglycemia in addition to her ongoing abdominal pain.  906Pt admitted to Dr. Josephine Cables. Awaiting second trop, urine. Repeat neuro exam normal.  The patient appears reasonably stabilized for admission considering the current resources, flow, and capabilities available in the ED at this time, and I doubt any other [facility] requiring further screening and/or treatment in the ED prior to admission.  I discussed this case with my attending physician who cosigned this note including patient's presenting symptoms, physical exam, and planned diagnostics and interventions. Attending physician stated agreement with plan or made changes to plan which were implemented.  Attending physician assessed patient at bedside.  Final Clinical Impression(s) / ED Diagnoses Final diagnoses:  Hypoglycemia  Right upper quadrant abdominal pain     Rx / DC Orders ED Discharge Orders    None       Alfredia Client, PA-C 03/31/20 2114    Milton Ferguson, MD 04/01/20 1109

## 2020-03-31 NOTE — ED Notes (Addendum)
Pt reports she had a laparoscopic cholecystectomy on December 22, 2019. Pt c/o RUQ pain since January 21, 2020. She reports the pain has been continuous and she has seen her doctor for the pain but nothing was found. She reports today the pain started radiating over to LUQ and into her chest that became severe. Pt also c/o abdominal swelling.

## 2020-03-31 NOTE — ED Notes (Signed)
Went into patients room to give medications. Pt is diaphoretic and lethargic. PA made aware.

## 2020-03-31 NOTE — ED Notes (Signed)
Pt given a meal, crackers, beverage & peanut butter.

## 2020-03-31 NOTE — ED Notes (Signed)
Amp d50 given per mar. Pt is now more alert.

## 2020-03-31 NOTE — ED Notes (Signed)
Patient transported to X-ray 

## 2020-03-31 NOTE — Telephone Encounter (Signed)
Patient called in crying stating she was having abdominal pain which has increased. Patient advised to go to ER per Larene Beach

## 2020-03-31 NOTE — ED Notes (Signed)
ED Provider at bedside. 

## 2020-03-31 NOTE — ED Notes (Signed)
Pt is awake & alert. Pt denies pain. Pt states he noticed his BP was going up when he checked it at home. Respirations are unlabored & even. Skin is pink, warm & dry. No distress noted. Pt placed in a gown & on the monitor. IV placed & blood/urine in the lab.

## 2020-03-31 NOTE — ED Triage Notes (Signed)
Chest pain onset today °

## 2020-03-31 NOTE — H&P (Signed)
History and Physical  Amanda Zamora GGY:[id number] DOB: 1961/01/03 DOA: 03/31/2020  Referring physician: Alfredia Client, PA-C PCP: Soyla Dryer, PA-C  Patient coming from: Home  Chief Complaint: Abdominal pain and chest pain  HPI: Amanda Zamora is a 60 y.o. female with medical history significant for GERD, pyelonephritis, type 2 diabetes mellitus, chronic cholecystitis with cholestectomy and hypertension who presents to the emergency department due to abdominal pain and chest pain.  Patient complained of 2-day onset of migratory reproducible abdominal pain (worse in RUQ), pain also migrates to the chest (right to left side of the chest), it was intermittent.  She recently had cholecystectomy (12/23/2019) AST 47, calcium 10.5, however, patient continues to complain of similar pain, she denies fever, chills, shortness of breath, nausea, vomiting or diarrhea.  ED Course:  In the emergency department, she was hemodynamically stable.  Work-up in the ED showed hypoglycemia with blood glucose of 33, IV D50 was given with improvement in blood glucose of 254, but this quickly decreased to 69 within 2.5 hours and patient was started on IV D5W.  Patient was also noted to have leukocytosis, BUN to creatinine 29/1.37 (baseline creatinine at 0.8 - 1.0), ALT 47, calcium 10.5.  CT angiography of chest showed no evidence of aortic aneurysm or dissection and was inconclusive for pulmonary embolism.  CT abdomen and pelvis showed fatty liver with evidence of prior cholecystectomy.  She was provided with IV hydration, IV morphine due to abdominal pain was given.  Hospitalist was asked to admit patient for further evaluation and management.  Review of Systems: Constitutional: Negative for chills and fever.  HENT: Negative for ear pain and sore throat.   Eyes: Negative for pain and visual disturbance.  Respiratory: Negative for cough, chest tightness and shortness of breath.   Cardiovascular: Positive for  reproducible chest pain negative for palpitations.  Gastrointestinal: Positive for abdominal pain. Negative for vomiting.  Endocrine: Negative for polyphagia and polyuria.  Genitourinary: Negative for decreased urine volume, dysuria, enuresis Musculoskeletal: Negative for arthralgias and back pain.  Skin: Negative for color change and rash.  Allergic/Immunologic: Negative for immunocompromised state.  Neurological: Negative for tremors, syncope, speech difficulty, weakness, light-headedness and headaches.  Hematological: Does not bruise/bleed easily.  All other systems reviewed and are negative  Past Medical History:  Diagnosis Date  . Detached retina   . GERD (gastroesophageal reflux disease)   . History of pyelonephritis   . PONV (postoperative nausea and vomiting)   . Type 2 diabetes mellitus ([place])    Past Surgical History:  Procedure Laterality Date  . CHOLECYSTECTOMY N/A 12/23/2019   Procedure: LAPAROSCOPIC CHOLECYSTECTOMY;  Surgeon: Virl Cagey, MD;  Location: [facility];  Service: General;  Laterality: N/A;  . EYE SURGERY Right    retina  . TUBAL LIGATION      Social History:  reports that she has never smoked. She has never used smokeless tobacco. She reports previous alcohol use. She reports that she does not use drugs.   No Known Allergies  Family History  Problem Relation Age of Onset  . Breast cancer Sister   . Diabetes Sister     Prior to Admission medications   Medication Sig Start Date End Date Taking? Authorizing Provider  atorvastatin (LIPITOR) 80 MG tablet Take 1 tablet (80 mg total) by mouth daily. Tome una tableta por boca diaria 03/09/20   Soyla Dryer, PA-C  cyclobenzaprine (FLEXERIL) 10 MG tablet 1 po q 8 hour prn.  Tome una tableta por boca cada 8 horas  cuando sea necesario Patient taking differently: 1 po q 8 hour prn.  Tome una tableta por boca cada 8 horas cuando sea necesario 11/28/18   Soyla Dryer, PA-C  diclofenac (VOLTAREN) 75 MG EC  tablet 1 po bid with food.  Tome una tableta por boca dos veces diarias con comida 01/06/20   Soyla Dryer, PA-C  docusate sodium (COLACE) 100 MG capsule Take 1 capsule (100 mg total) by mouth 2 (two) times daily as needed for mild constipation (while taking narcotic). 12/23/19 12/22/20  Virl Cagey, MD  erythromycin ophthalmic ointment Place 1 application into the right eye at bedtime.  10/28/19   [provider]  gabapentin (NEURONTIN) 300 MG capsule Take 1 capsule by mouth twice daily 07/20/19   Soyla Dryer, PA-C  gentamicin (GARAMYCIN) 0.3 % ophthalmic solution Place 1 drop into the right eye daily.  12/02/19   [provider]  insulin NPH-regular Human (NOVOLIN 70/30 RELION) (70-30) 100 UNIT/ML injection Inject 50 units SQ before breakfast and 30-50 units before evening meal.   Injecte 50 unidades subcutaneamente dos veces al dia con el alimento y 30-50 unidades antes de el alimento de la tarde Patient taking differently: Inject 70 units SQ before breakfast and 20-30 units before evening meal.   Injecte 70 unidades subcutaneamente dos veces al dia con el alimento y 20-30 unidades antes de el alimento de la tarde 10/24/19   Soyla Dryer, PA-C  irbesartan (AVAPRO) 300 MG tablet Take 1 tablet (300 mg total) by mouth daily. Tome una tableta por boca diaria 03/09/20   Soyla Dryer, PA-C  losartan (COZAAR) 100 MG tablet Take 1 tablet by mouth once daily 01/28/20   Soyla Dryer, PA-C  metFORMIN (GLUCOPHAGE) 1000 MG tablet TAKE 1 TABLET BY MOUTH TWICE DAILY WITH A MEAL.   Tome una tableta por boca dos veces diarias con comida 03/09/20   Soyla Dryer, PA-C  Omega-3 Fatty Acids (FISH OIL) 1000 MG CAPS Take by mouth.    [provider]  omeprazole (PRILOSEC) 20 MG capsule Take 1 capsule (20 mg total) by mouth daily. Tome una tableta por boca diaria 03/09/20   Soyla Dryer, PA-C    Physical Exam: BP (!) 124/56   Pulse 74   Temp 97.6 F (36.4 C) (Oral)    Resp 15   Ht 5\' 2"  (1.575 m)   Wt 78.2 kg   LMP 02/15/2011 (Approximate)   SpO2 100%   BMI 31.53 kg/m   . General: 60 y.o. year-old female well developed well nourished in no acute distress.  Alert and oriented x3. Marland Kitchen HEENT: NCAT, EOMI . Neck: Supple, trachea media . Cardiovascular: Regular rate and rhythm with no rubs or gallops.  No thyromegaly or JVD noted. 2/4 pulses in all 4 extremities. Marland Kitchen Respiratory: Clear to auscultation with no wheezes or rales. Good inspiratory effort. . Abdomen: Soft, tender to palpation in RUQ. Normal bowel sounds x4 quadrants. . Muskuloskeletal: Left upper chest area tender to palpation. No cyanosis, clubbing or edema noted bilaterally . Neuro: CN II-XII intact, strength 5/5 x 4, sensation, reflexes . Skin: No ulcerative lesions noted or rashes . Psychiatry: Judgement and insight appear normal. Mood is appropriate for condition and setting          Labs on Admission:  Basic Metabolic Panel: Recent Labs  Lab 03/31/20 1604  NA 133*  K 4.6  CL 98  CO2 22  GLUCOSE 114*  BUN 29*  CREATININE 1.37*  CALCIUM 10.5*   Liver Function Tests:  Recent Labs  Lab 03/31/20 1604  AST 47*  ALT 29  ALKPHOS 76  BILITOT 0.7  PROT 8.7*  ALBUMIN 4.7   Recent Labs  Lab 03/31/20 1604  LIPASE 29   No results for input(s): AMMONIA in the last 168 hours. CBC: Recent Labs  Lab 03/31/20 1604  WBC 12.5*  HGB 12.5  HCT 38.5  MCV 90.6  PLT 333   Cardiac Enzymes: No results for input(s): CKTOTAL, CKMB, CKMBINDEX, TROPONINI in the last 168 hours.  BNP (last 3 results) No results for input(s): BNP in the last 8760 hours.  ProBNP (last 3 results) No results for input(s): PROBNP in the last 8760 hours.  CBG: Recent Labs  Lab 03/31/20 2040 03/31/20 2127 03/31/20 2238 04/01/20 0011 04/01/20 0106  GLUCAP 69* 60* 165* 214* 195*    Radiological Exams on Admission: DG Chest 2 View  Result Date: 03/31/2020 CLINICAL DATA:  Chest pain EXAM: CHEST - 2  VIEW COMPARISON:  02/21/2020 FINDINGS: Low lung volumes. No focal opacity or pleural effusion. Normal cardiomediastinal silhouette. No pneumothorax. IMPRESSION: No active cardiopulmonary disease. Electronically Signed   By: Donavan Foil M.D.   On: 03/31/2020 17:02   CT Abdomen Pelvis W Contrast  Result Date: 03/31/2020 CLINICAL DATA:  Cholelithiasis chest pain EXAM: CT ABDOMEN AND PELVIS WITH CONTRAST TECHNIQUE: Multidetector CT imaging of the abdomen and pelvis was performed using the standard protocol following bolus administration of intravenous contrast. CONTRAST:  70mL OMNIPAQUE IOHEXOL 300 MG/ML  SOLN COMPARISON:  Ultrasound 12/04/2019, CT 12/04/2019 FINDINGS: Lower chest: Lung bases demonstrate no acute consolidation or effusion. Normal cardiac size. Hepatobiliary: Status post cholecystectomy. Hepatic steatosis. No focal hepatic abnormality or biliary dilatation Pancreas: Unremarkable. No pancreatic ductal dilatation or surrounding inflammatory changes. Spleen: Normal in size without focal abnormality. Adrenals/Urinary Tract: Adrenal glands are normal. Cyst in the mid right kidney. No hydronephrosis. Distended urinary bladder. Stomach/Bowel: Stomach is within normal limits. Appendix appears normal. Small stones in the appendix but no inflammatory change. No evidence of bowel wall thickening, distention, or inflammatory changes. Vascular/Lymphatic: Mild aortic atherosclerosis. No aneurysm. No suspicious nodes. Reproductive: Uterus and bilateral adnexa are unremarkable. Other: Negative for free air or free fluid Musculoskeletal: Trace anterolisthesis L4 on L5. No acute or suspicious osseous abnormality IMPRESSION: 1. No CT evidence for acute intra-abdominal or pelvic abnormality. 2. Hepatic steatosis.  Status post cholecystectomy. Aortic Atherosclerosis (ICD10-I70.0). Electronically Signed   By: Donavan Foil M.D.   On: 03/31/2020 18:19   CT ANGIO CHEST AORTA W/CM &/OR WO/CM  Result Date:  03/31/2020 CLINICAL DATA:  Chest pain and abdominal pain. Suspicious for aortic dissection. EXAM: CT ANGIOGRAPHY CHEST WITH CONTRAST TECHNIQUE: Multidetector CT imaging of the chest was performed using the standard protocol during bolus administration of intravenous contrast. Multiplanar CT image reconstructions and MIPs were obtained to evaluate the vascular anatomy. CONTRAST:  101mL OMNIPAQUE IOHEXOL 350 MG/ML SOLN COMPARISON:  April 11, 2018 FINDINGS: Cardiovascular: The thoracic aorta is normal in appearance without evidence of aneurysmal dilatation or dissection. The pulmonary arteries are markedly limited in evaluation secondary to suboptimal opacification with intravenous contrast. Normal heart size with moderate severity coronary artery calcification. No pericardial effusion. Mediastinum/Nodes: No enlarged mediastinal, hilar, or axillary lymph nodes. Thyroid gland, trachea, and esophagus demonstrate no significant findings. Lungs/Pleura: Very mild atelectasis is seen within the bilateral lung bases. There is no evidence of a pleural effusion or pneumothorax. Upper Abdomen: There is diffuse fatty infiltration of the liver parenchyma. Multiple surgical clips are seen within the gallbladder  fossa. Musculoskeletal: Multilevel degenerative changes are seen throughout the thoracic spine. Review of the MIP images confirms the above findings. IMPRESSION: 1. No evidence of aortic aneurysm or dissection. 2. Limited evaluation of the pulmonary arteries, as described above, as a result, pulmonary embolism cannot be excluded. 3. Fatty liver. 4. Evidence of prior cholecystectomy. Electronically Signed   By: Virgina Norfolk M.D.   On: 03/31/2020 19:44    EKG: I independently viewed the EKG done and my findings are as followed: Normal sinus rhythm at a rate of 89 bpm  Assessment/Plan Present on Admission: . Hypoglycemia . Precordial pain . HTN (hypertension) . Obesity, unspecified  Principal Problem:    Hypoglycemia Active Problems:   Precordial pain   Type 2 diabetes mellitus ([facility])   HTN (hypertension)   Obesity, unspecified   Abdominal pain   Leukocytosis   AKI (acute kidney injury) ([place])   Hypercalcemia   Hypoglycemia Patient was started on IV D5W Continue to monitor CBG with hypoglycemic protocol  Abdominal pain and precordial pain Hepatic steatosis Patient's abdominal pain appears to be chronic, she is status post cholecystectomy.  Abdominal pain prior to cholecystectomy was thought to be due to biliary colic CT abdomen and pelvis showed no acute intra-abdominal or pelvic abnormality other than hepatic steatosis Liver enzymes were normal except for AST that was mildly elevated at 47 Precordial pain was reproducible and does not appear to be cardiac in origin, troponin x2 was negative EKG personally reviewed shows normal sinus rhythm at a rate of 89 bpm Continue IV morphine 2 mg every 4 hours as needed Continue to avoid fatty foods  Leukocytosis possibly reactive WBC 12.5, continue monitor WBC with morning labs  Acute kidney injury BUN to creatinine 29/1.37 (baseline creatinine at 0.8 - 1.0) Renally adjust medications, avoid nephrotoxic agents/dehydration/hypotension  Hypercalcemia possibly reactive Ca 10.5; IV hydration was provided Continue to monitor calcium level with morning labs  Essential hypertension Continue losartan  T2DM Basal insulin held at this time due to hypoglycemia  Diabetic neuropathy Continue Flexeril and gabapentin  GERD Continue Protonix  Hyperlipidemia Continue Lipitor and Lovaza  Obesity (BMI 31.53) Patient counseled on diet and lifestyle modification   DVT prophylaxis: Lovenox  Code Status: Full code  Family Communication: None at bedside  Disposition Plan:  Patient is from:                        home Anticipated DC to:                   SNF or family members home Anticipated DC date:               2-3 days Anticipated  DC barriers:           Patient requires inpatient management of hypoglycemia.   Consults called: None  Admission status: Observation    Bernadette Hoit MD [facility]  04/01/2020, 1:33 AM

## 2020-03-31 NOTE — ED Notes (Signed)
1000cc clear yellow urine emptied.

## 2020-03-31 NOTE — ED Notes (Signed)
cbg obtained by this nurse. cbg is 33. PA made aware. Verbal order for a amp of D50 to give.

## 2020-04-01 ENCOUNTER — Other Ambulatory Visit: Payer: Self-pay

## 2020-04-01 DIAGNOSIS — N179 Acute kidney failure, unspecified: Secondary | ICD-10-CM

## 2020-04-01 DIAGNOSIS — Z6831 Body mass index (BMI) 31.0-31.9, adult: Secondary | ICD-10-CM

## 2020-04-01 DIAGNOSIS — Z794 Long term (current) use of insulin: Secondary | ICD-10-CM

## 2020-04-01 DIAGNOSIS — D72829 Elevated white blood cell count, unspecified: Secondary | ICD-10-CM

## 2020-04-01 DIAGNOSIS — R109 Unspecified abdominal pain: Secondary | ICD-10-CM

## 2020-04-01 DIAGNOSIS — E669 Obesity, unspecified: Secondary | ICD-10-CM

## 2020-04-01 LAB — GLUCOSE, CAPILLARY
Glucose-Capillary: 149 mg/dL — ABNORMAL HIGH (ref 70–99)
Glucose-Capillary: 151 mg/dL — ABNORMAL HIGH (ref 70–99)
Glucose-Capillary: 175 mg/dL — ABNORMAL HIGH (ref 70–99)
Glucose-Capillary: 195 mg/dL — ABNORMAL HIGH (ref 70–99)
Glucose-Capillary: 214 mg/dL — ABNORMAL HIGH (ref 70–99)
Glucose-Capillary: 33 mg/dL — CL (ref 70–99)
Glucose-Capillary: 88 mg/dL (ref 70–99)
Glucose-Capillary: 93 mg/dL (ref 70–99)
Glucose-Capillary: 98 mg/dL (ref 70–99)

## 2020-04-01 LAB — CBC
HCT: 33.3 % — ABNORMAL LOW (ref 36.0–46.0)
Hemoglobin: 10.6 g/dL — ABNORMAL LOW (ref 12.0–15.0)
MCH: 29.7 pg (ref 26.0–34.0)
MCHC: 31.8 g/dL (ref 30.0–36.0)
MCV: 93.3 fL (ref 80.0–100.0)
Platelets: 253 10*3/uL (ref 150–400)
RBC: 3.57 MIL/uL — ABNORMAL LOW (ref 3.87–5.11)
RDW: 13.7 % (ref 11.5–15.5)
WBC: 6.6 10*3/uL (ref 4.0–10.5)
nRBC: 0 % (ref 0.0–0.2)

## 2020-04-01 LAB — PROTIME-INR
INR: 1 (ref 0.8–1.2)
Prothrombin Time: 12.8 seconds (ref 11.4–15.2)

## 2020-04-01 LAB — COMPREHENSIVE METABOLIC PANEL
ALT: 23 U/L (ref 0–44)
AST: 26 U/L (ref 15–41)
Albumin: 3.5 g/dL (ref 3.5–5.0)
Alkaline Phosphatase: 59 U/L (ref 38–126)
Anion gap: 7 (ref 5–15)
BUN: 18 mg/dL (ref 6–20)
CO2: 27 mmol/L (ref 22–32)
Calcium: 9 mg/dL (ref 8.9–10.3)
Chloride: 104 mmol/L (ref 98–111)
Creatinine, Ser: 0.85 mg/dL (ref 0.44–1.00)
GFR, Estimated: >60 mL/min (ref >60)
Glucose, Bld: 127 mg/dL — ABNORMAL HIGH (ref 70–99)
Potassium: 4 mmol/L (ref 3.5–5.1)
Sodium: 138 mmol/L (ref 135–145)
Total Bilirubin: 0.6 mg/dL (ref 0.3–1.2)
Total Protein: 6.7 g/dL (ref 6.5–8.1)

## 2020-04-01 LAB — APTT: aPTT: 28 seconds (ref 24–36)

## 2020-04-01 LAB — MAGNESIUM: Magnesium: 1.9 mg/dL (ref 1.7–2.4)

## 2020-04-01 LAB — HIV ANTIBODY (ROUTINE TESTING W REFLEX): HIV Screen 4th Generation wRfx: NONREACTIVE

## 2020-04-01 LAB — PHOSPHORUS: Phosphorus: 4.2 mg/dL (ref 2.5–4.6)

## 2020-04-01 MED ORDER — GABAPENTIN 300 MG PO CAPS: ORAL_CAPSULE | ORAL | 3 refills | Status: DC

## 2020-04-01 MED ORDER — GABAPENTIN 300 MG PO CAPS
300.0000 mg | ORAL_CAPSULE | Freq: Two times a day (BID) | ORAL | Status: DC
  Administered 2020-04-01: 300 mg via ORAL
  Filled 2020-04-01: qty 1

## 2020-04-01 MED ORDER — OMEGA-3-ACID ETHYL ESTERS 1 G PO CAPS
1.0000 g | ORAL_CAPSULE | Freq: Every day | ORAL | Status: DC
  Administered 2020-04-01: 1 g via ORAL
  Filled 2020-04-01: qty 1

## 2020-04-01 MED ORDER — METHOCARBAMOL 500 MG PO TABS: 500.0000 mg | ORAL_TABLET | Freq: Three times a day (TID) | ORAL | 0 refills | Status: DC | PRN

## 2020-04-01 MED ORDER — CYCLOBENZAPRINE HCL 10 MG PO TABS: 10.0000 mg | ORAL_TABLET | Freq: Three times a day (TID) | ORAL | Status: DC | PRN

## 2020-04-01 MED ORDER — OXYCODONE HCL 5 MG PO TABS: 5.0000 mg | ORAL_TABLET | Freq: Three times a day (TID) | ORAL | 0 refills | Status: DC | PRN

## 2020-04-01 MED ORDER — ROSUVASTATIN CALCIUM 20 MG PO TABS: 20.0000 mg | ORAL_TABLET | Freq: Every day | ORAL | 3 refills | Status: DC

## 2020-04-01 MED ORDER — IRBESARTAN 150 MG PO TABS
300.0000 mg | ORAL_TABLET | Freq: Every day | ORAL | Status: DC
Start: 2020-04-01 — End: 2020-04-01
  Administered 2020-04-01: 300 mg via ORAL
  Filled 2020-04-01: qty 2

## 2020-04-01 MED ORDER — FISH OIL 1000 MG PO CAPS: 1.0000 | ORAL_CAPSULE | Freq: Two times a day (BID) | ORAL | 2 refills | Status: AC

## 2020-04-01 MED ORDER — PANTOPRAZOLE SODIUM 40 MG PO TBEC
40.0000 mg | DELAYED_RELEASE_TABLET | Freq: Every day | ORAL | Status: DC
  Administered 2020-04-01: 40 mg via ORAL
  Filled 2020-04-01: qty 1

## 2020-04-01 MED ORDER — ATORVASTATIN CALCIUM 40 MG PO TABS
80.0000 mg | ORAL_TABLET | Freq: Every day | ORAL | Status: DC
  Administered 2020-04-01: 80 mg via ORAL
  Filled 2020-04-01: qty 2

## 2020-04-01 NOTE — Discharge Summary (Signed)
Physician Discharge Summary  Amanda Zamora NOB:[id number] DOB: 1960/09/23 DOA: 03/31/2020  PCP: Soyla Dryer, PA-C  Admit date: 03/31/2020 Discharge date: 04/01/2020  Time spent: 35 minutes  Recommendations for Outpatient Follow-up:  1. Reassess blood pressure and adjust antihypertensive regimen as needed 2. Repeat basic metabolic panel to evaluate lites renal function 3. Close monitoring of patient's CBGs with further adjustment to hypoglycemia regimen as required. 4. Follow patient response to adjusted dose of neuropathy medication, with further intervention using Lyrica/Cymbalta as needed. 5. Close monitoring of patient lipid panel and LFTs.   Discharge Diagnoses:  Principal Problem:   Hypoglycemia Active Problems:   Precordial pain   Type 2 diabetes mellitus (HCC)   Hyperlipidemia   HTN (hypertension)   Obesity, unspecified   GERD (gastroesophageal reflux disease)   Type 2 diabetes mellitus with diabetic neuropathy, unspecified (HCC)   Abdominal pain   Leukocytosis   AKI (acute kidney injury) ([place])   Hypercalcemia   Discharge Condition: Stable and improved.  Discharged home with instruction to follow-up with PCP in 10 days.  CODE STATUS: Full code.  Diet recommendation: Heart healthy/low-fat, low calorie and modify carbohydrate diet.  Filed Weights   03/31/20 2230  Weight: 78.2 kg    History of present illness:  As per H&P written by Dr. Josephine Cables on 03/31/2020  Amanda Zamora is a 60 y.o. female with medical history significant for GERD, pyelonephritis, type 2 diabetes mellitus, chronic cholecystitis with cholestectomy and hypertension who presents to the emergency department due to abdominal pain and chest pain.  Patient complained of 2-day onset of migratory reproducible abdominal pain (worse in RUQ), pain also migrates to the chest (right to left side of the chest), it was intermittent.  She recently had cholecystectomy (12/23/2019) AST 47, calcium 10.5,  however, patient continues to complain of similar pain, she denies fever, chills, shortness of breath, nausea, vomiting or diarrhea.  ED Course:  In the emergency department, she was hemodynamically stable.  Work-up in the ED showed hypoglycemia with blood glucose of 33, IV D50 was given with improvement in blood glucose of 254, but this quickly decreased to 69 within 2.5 hours and patient was started on IV D5W.  Patient was also noted to have leukocytosis, BUN to creatinine 29/1.37 (baseline creatinine at 0.8 - 1.0), ALT 47, calcium 10.5.  CT angiography of chest showed no evidence of aortic aneurysm or dissection and was inconclusive for pulmonary embolism.  CT abdomen and pelvis showed fatty liver with evidence of prior cholecystectomy.  She was provided with IV hydration, IV morphine due to abdominal pain was given.  Hospitalist was asked to admit patient for further evaluation and management.  Hospital Course:  1-hypoglycemia -Patient with history of type 2 diabetes with long-term insulin use -Express lack of proper oral intake despite being compliant with her insulin therapy -CBGs resolved with the use of intravenous D5W overnight -Patient has been educated about the importance of not skipping meals while being compliant with her hypoglycemic regimen. -Most recent A1c 7.8 -Continue outpatient follow-up with PCP. -Patient has been instructed to check her blood sugar levels more frequently.  2-right upper quadrant abdominal pain/precordial discomfort -Negative troponin, no EKG or telemetry abnormalities -Patient reports ongoing pain since her cholecystectomy in November 2021 -Appears to be associated with neuropathy/muscle spasm -Neurontin regimen has been adjusted, Robaxin has been added to her regimen. -Patient advised to be compliant with low-fat diet and to maintain adequate hydration -As needed prescription for severe pain using Roxicodone provided.  3-hepatic acidosis -  Low-fat diet  and compliance with statins are recommended. -Repeat LFTs as an outpatient  4-hypertension -Stable and well-controlled -Resume home antihypertensive agent -Heart healthy diet has been encouraged.  5-acute kidney injury -Resolved after fluid resuscitation provided -Patient advised to maintain adequate hydration -Repeat basic metabolic panel follow-up visit to assess stability.  6-gastroesophageal reflux disease -Continue Protonix.  7-hyperlipidemia -Continue statins (dose adjusted) and lovaza.  8-class 1 obesity -Body mass index is 31.53 kg/m. -Low calorie diet, portion control and increase physical activity discussed with patient.  Procedures: See below for x-ray reports.  Consultations:  None  Discharge Exam: Vitals:   04/01/20 0209 04/01/20 0521  BP: 120/70 116/68  Pulse: 64 62  Resp: 20 (!) 22  Temp: 98.4 F (36.9 C) 98 F (36.7 C)  SpO2: 98% 100%    General: Afebrile, reports no chest pain; no nausea or vomiting.  Still experiencing intermittent right upper quadrant discomfort. Cardiovascular: S1 and S2, no rubs, no gallops, no JVD. Respiratory: Good saturation on room air; clear to auscultation bilaterally.  No using accessory muscles. Abdomen: Vague discomfort in her right upper quadrant/midepigastric region radiated to her back.  Nonreproducible with palpation.  No guarding appreciated.  Positive bowel sounds. Extremities: No cyanosis or clubbing.  Discharge Instructions   Discharge Instructions    Diet - low sodium heart healthy   Complete by: As directed    Diet Carb Modified   Complete by: As directed    Discharge instructions   Complete by: As directed    Maintain adequate nutrition and hydration No skipping meals Be compliant with medication Follow heart healthy/modified carbohydrate diet Increase physical activity and watch calorie/portion intake to assist with weight loss. Arrange follow-up with PCP in 10 days     Allergies as of  04/01/2020   No Known Allergies     Medication List    STOP taking these medications   atorvastatin 80 MG tablet Commonly known as: LIPITOR   cyclobenzaprine 10 MG tablet Commonly known as: FLEXERIL   diclofenac 75 MG EC tablet Commonly known as: VOLTAREN   docusate sodium 100 MG capsule Commonly known as: Colace   losartan 100 MG tablet Commonly known as: COZAAR     TAKE these medications   erythromycin ophthalmic ointment Place 1 application into the right eye at bedtime.   Fish Oil 1000 MG Caps Take 1 capsule (1,000 mg total) by mouth in the morning and at bedtime. What changed:   how much to take  when to take this   gabapentin 300 MG capsule Commonly known as: [place] one capsule in am and lunchtime; then 2 capsules at bedtime. What changed:   how much to take  how to take this  when to take this  additional instructions   gentamicin 0.3 % ophthalmic solution Commonly known as: GARAMYCIN Place 1 drop into the right eye daily.   irbesartan 300 MG tablet Commonly known as: AVAPRO Take 1 tablet (300 mg total) by mouth daily. Tome una tableta por boca diaria   metFORMIN 1000 MG tablet Commonly known as: GLUCOPHAGE TAKE 1 TABLET BY MOUTH TWICE DAILY WITH A MEAL.   Tome una tableta por boca dos veces diarias con comida   methocarbamol 500 MG tablet Commonly known as: Robaxin Take 1 tablet (500 mg total) by mouth every 8 (eight) hours as needed for muscle spasms.   NovoLIN 70/30 ReliOn (70-30) 100 UNIT/ML injection Generic drug: insulin NPH-regular Human Inject 50 units SQ before breakfast and 30-50 units  before evening meal.   Injecte 50 unidades subcutaneamente dos veces al dia con el alimento y 30-50 unidades antes de el alimento de la tarde What changed: additional instructions   omeprazole 20 MG capsule Commonly known as: PRILOSEC Take 1 capsule (20 mg total) by mouth daily. Tome una tableta por boca diaria   oxyCODONE 5 MG immediate  release tablet Commonly known as: Roxicodone Take 1 tablet (5 mg total) by mouth every 8 (eight) hours as needed for severe pain.   rosuvastatin 20 MG tablet Commonly known as: Crestor Take 1 tablet (20 mg total) by mouth daily.      No Known Allergies  Follow-up Information    Soyla Dryer, PA-C. Schedule an appointment as soon as possible for a visit in 10 day(s).   Specialty: Physician Assistant Contact information: [address] [place] [address] [phone]               The results of significant diagnostics from this hospitalization (including imaging, microbiology, ancillary and laboratory) are listed below for reference.    Significant Diagnostic Studies: DG Chest 2 View  Result Date: 03/31/2020 CLINICAL DATA:  Chest pain EXAM: CHEST - 2 VIEW COMPARISON:  02/21/2020 FINDINGS: Low lung volumes. No focal opacity or pleural effusion. Normal cardiomediastinal silhouette. No pneumothorax. IMPRESSION: No active cardiopulmonary disease. Electronically Signed   By: Donavan Foil M.D.   On: 03/31/2020 17:02   CT Abdomen Pelvis W Contrast  Result Date: 03/31/2020 CLINICAL DATA:  Cholelithiasis chest pain EXAM: CT ABDOMEN AND PELVIS WITH CONTRAST TECHNIQUE: Multidetector CT imaging of the abdomen and pelvis was performed using the standard protocol following bolus administration of intravenous contrast. CONTRAST:  69mL OMNIPAQUE IOHEXOL 300 MG/ML  SOLN COMPARISON:  Ultrasound 12/04/2019, CT 12/04/2019 FINDINGS: Lower chest: Lung bases demonstrate no acute consolidation or effusion. Normal cardiac size. Hepatobiliary: Status post cholecystectomy. Hepatic steatosis. No focal hepatic abnormality or biliary dilatation Pancreas: Unremarkable. No pancreatic ductal dilatation or surrounding inflammatory changes. Spleen: Normal in size without focal abnormality. Adrenals/Urinary Tract: Adrenal glands are normal. Cyst in the mid right kidney. No hydronephrosis. Distended urinary  bladder. Stomach/Bowel: Stomach is within normal limits. Appendix appears normal. Small stones in the appendix but no inflammatory change. No evidence of bowel wall thickening, distention, or inflammatory changes. Vascular/Lymphatic: Mild aortic atherosclerosis. No aneurysm. No suspicious nodes. Reproductive: Uterus and bilateral adnexa are unremarkable. Other: Negative for free air or free fluid Musculoskeletal: Trace anterolisthesis L4 on L5. No acute or suspicious osseous abnormality IMPRESSION: 1. No CT evidence for acute intra-abdominal or pelvic abnormality. 2. Hepatic steatosis.  Status post cholecystectomy. Aortic Atherosclerosis (ICD10-I70.0). Electronically Signed   By: Donavan Foil M.D.   On: 03/31/2020 18:19   CT ANGIO CHEST AORTA W/CM &/OR WO/CM  Result Date: 03/31/2020 CLINICAL DATA:  Chest pain and abdominal pain. Suspicious for aortic dissection. EXAM: CT ANGIOGRAPHY CHEST WITH CONTRAST TECHNIQUE: Multidetector CT imaging of the chest was performed using the standard protocol during bolus administration of intravenous contrast. Multiplanar CT image reconstructions and MIPs were obtained to evaluate the vascular anatomy. CONTRAST:  74mL OMNIPAQUE IOHEXOL 350 MG/ML SOLN COMPARISON:  April 11, 2018 FINDINGS: Cardiovascular: The thoracic aorta is normal in appearance without evidence of aneurysmal dilatation or dissection. The pulmonary arteries are markedly limited in evaluation secondary to suboptimal opacification with intravenous contrast. Normal heart size with moderate severity coronary artery calcification. No pericardial effusion. Mediastinum/Nodes: No enlarged mediastinal, hilar, or axillary lymph nodes. Thyroid gland, trachea, and esophagus demonstrate no significant  findings. Lungs/Pleura: Very mild atelectasis is seen within the bilateral lung bases. There is no evidence of a pleural effusion or pneumothorax. Upper Abdomen: There is diffuse fatty infiltration of the liver parenchyma.  Multiple surgical clips are seen within the gallbladder fossa. Musculoskeletal: Multilevel degenerative changes are seen throughout the thoracic spine. Review of the MIP images confirms the above findings. IMPRESSION: 1. No evidence of aortic aneurysm or dissection. 2. Limited evaluation of the pulmonary arteries, as described above, as a result, pulmonary embolism cannot be excluded. 3. Fatty liver. 4. Evidence of prior cholecystectomy. Electronically Signed   By: Virgina Norfolk M.D.   On: 03/31/2020 19:44    Microbiology: No results found for this or any previous visit (from the past 240 hour(s)).   Labs: Basic Metabolic Panel: Recent Labs  Lab 03/31/20 1604 04/01/20 0424  NA 133* 138  K 4.6 4.0  CL 98 104  CO2 22 27  GLUCOSE 114* 127*  BUN 29* 18  CREATININE 1.37* 0.85  CALCIUM 10.5* 9.0  MG  --  1.9  PHOS  --  4.2   Liver Function Tests: Recent Labs  Lab 03/31/20 1604 04/01/20 0424  AST 47* 26  ALT 29 23  ALKPHOS 76 59  BILITOT 0.7 0.6  PROT 8.7* 6.7  ALBUMIN 4.7 3.5   Recent Labs  Lab 03/31/20 1604  LIPASE 29   CBC: Recent Labs  Lab 03/31/20 1604 04/01/20 0424  WBC 12.5* 6.6  HGB 12.5 10.6*  HCT 38.5 33.3*  MCV 90.6 93.3  PLT 333 253    CBG: Recent Labs  Lab 04/01/20 0306 04/01/20 0522 04/01/20 0655 04/01/20 0800 04/01/20 1144  GLUCAP 151* 98 93 88 175*    Signed:  Barton Dubois MD.  [facility] 04/01/2020, 3:39 PM

## 2020-04-01 NOTE — Plan of Care (Signed)

## 2020-04-20 ENCOUNTER — Ambulatory Visit: Payer: Self-pay | Admitting: Physician Assistant

## 2020-04-20 DIAGNOSIS — Z789 Other specified health status: Secondary | ICD-10-CM

## 2020-04-20 DIAGNOSIS — I10 Essential (primary) hypertension: Secondary | ICD-10-CM

## 2020-04-20 DIAGNOSIS — E1165 Type 2 diabetes mellitus with hyperglycemia: Secondary | ICD-10-CM

## 2020-04-20 DIAGNOSIS — R52 Pain, unspecified: Secondary | ICD-10-CM

## 2020-04-20 NOTE — Progress Notes (Signed)
LMP 02/15/2011 (Approximate)    Subjective:    Patient ID: Amanda Zamora, female    DOB: Mar 21, 1960, 60 y.o.   MRN: [id number]  HPI: Amanda Zamora is a 60 y.o. female presenting on 04/20/2020 for No chief complaint on file.   HPI   This is a telemedicine appointment through [employer]  I connected with  Amanda Zamora on 04/20/20 by a video enabled telemedicine application and verified that I am speaking with the correct person using two identifiers.   I discussed the limitations of evaluation and management by telemedicine. The patient expressed understanding and agreed to proceed.  Pt is at home.  Provider and translator are at office.    Pt is 32yoF who had appointment for in person follow up of abdominal pain.  She call shortly before her appiontment stating that her abdominal pain was gone so she wanted to have a virtual appointment instead.  She has regular follow up appointment next month  Review of epic shows recent hospital adm.  She says she wants prescription for back pain and muscle pain.  She still has some oxycodone.  She wonders if she Zamora be continued on this prescription.  She has been monitoring her bp  And bs at home.  She says her bs is really high.  Like 230 in the morning and just now 189.    She doesn't remember numbers for bp but she reports that the machine says it's normal.   She is currently using insulin 70/30 and taking 70 units in the morning and 30 units at bedtime.  She is using such a big difference in doses in efforts to avoid low blood sugars after her evening dose.     Pt had laparoscopic cholecystectomy on 12/23/19.  Her last follow up with the surgeon in office was 01/28/20.  She was having pain at that time and the surgeon felt the pain to be musculoskeletal in nature.        Relevant past medical, surgical, family and social history reviewed and updated as indicated. Interim medical history since our last visit reviewed. Allergies and  medications reviewed and updated.   Current Outpatient Medications:  .  insulin NPH-regular Human (NOVOLIN 70/30 RELION) (70-30) 100 UNIT/ML injection, Inject 50 units SQ before breakfast and 30-50 units before evening meal.   Injecte 50 unidades subcutaneamente dos veces al dia con el alimento y 30-50 unidades antes de el alimento de la tarde (Patient taking differently: Inject 70 units SQ before breakfast and 20-30 units before evening meal.   Injecte 70 unidades subcutaneamente dos veces al dia con el alimento y 20-30 unidades antes de el alimento de la tarde), Disp: 10 mL, Rfl: 0 .  erythromycin ophthalmic ointment, Place 1 application into the right eye at bedtime.  (Patient not taking: Reported on 04/01/2020), Disp: , Rfl:  .  gabapentin (NEURONTIN) 300 MG capsule, Tome one capsule in am and lunchtime; then 2 capsules at bedtime., Disp: 120 capsule, Rfl: 3 .  gentamicin (GARAMYCIN) 0.3 % ophthalmic solution, Place 1 drop into the right eye daily.  (Patient not taking: Reported on 04/01/2020), Disp: , Rfl:  .  irbesartan (AVAPRO) 300 MG tablet, Take 1 tablet (300 mg total) by mouth daily. Tome una tableta por boca diaria, Disp: 30 tablet, Rfl: 3 .  metFORMIN (GLUCOPHAGE) 1000 MG tablet, TAKE 1 TABLET BY MOUTH TWICE DAILY WITH A MEAL.   Tome una tableta por boca dos veces diarias con comida, Disp: 180 tablet, Rfl:  0 .  methocarbamol (ROBAXIN) 500 MG tablet, Take 1 tablet (500 mg total) by mouth every 8 (eight) hours as needed for muscle spasms., Disp: 45 tablet, Rfl: 0 .  Omega-3 Fatty Acids (FISH OIL) 1000 MG CAPS, Take 1 capsule (1,000 mg total) by mouth in the morning and at bedtime., Disp: 60 capsule, Rfl: 2 .  omeprazole (PRILOSEC) 20 MG capsule, Take 1 capsule (20 mg total) by mouth daily. Tome una tableta por boca diaria, Disp: 90 capsule, Rfl: 1 .  oxyCODONE (ROXICODONE) 5 MG immediate release tablet, Take 1 tablet (5 mg total) by mouth every 8 (eight) hours as needed for severe pain., Disp:  20 tablet, Rfl: 0 .  rosuvastatin (CRESTOR) 20 MG tablet, Take 1 tablet (20 mg total) by mouth daily., Disp: 30 tablet, Rfl: 3    Review of Systems  Per HPI unless specifically indicated above     Objective:    LMP 02/15/2011 (Approximate)   Wt Readings from Last 3 Encounters:  03/31/20 172 lb 6.4 oz (78.2 kg)  02/18/20 172 lb (78 kg)  01/28/20 167 lb (75.8 kg)    Physical Exam Constitutional:      General: She is not in acute distress.    Appearance: She is not toxic-appearing.  HENT:     Head: Normocephalic and atraumatic.  Pulmonary:     Effort: No respiratory distress.     Comments: Pt is talking in complete sentences without sob Neurological:     Mental Status: She is alert and oriented to person, place, and time.  Psychiatric:        Attention and Perception: Attention normal.        Speech: Speech normal.        Behavior: Behavior is cooperative.               Assessment & Plan:   Encounter Diagnoses  Name Primary?  Marland Kitchen Uncontrolled type 2 diabetes mellitus with hyperglycemia ([place]) Yes  . Essential hypertension   . Pain   . Not proficient in English language       -Pt to Monitor bs.  She is to bring her bs log to appt in 2 wk.  Zamora update Labs before that appt.  -Zamora evaluate pain at that appointment - Pt to contact office sooner prn

## 2020-04-21 ENCOUNTER — Encounter: Payer: Self-pay | Admitting: Physician Assistant

## 2020-04-22 ENCOUNTER — Other Ambulatory Visit: Payer: Self-pay | Admitting: Physician Assistant

## 2020-04-22 DIAGNOSIS — E785 Hyperlipidemia, unspecified: Secondary | ICD-10-CM

## 2020-04-22 DIAGNOSIS — E1165 Type 2 diabetes mellitus with hyperglycemia: Secondary | ICD-10-CM

## 2020-04-22 DIAGNOSIS — I10 Essential (primary) hypertension: Secondary | ICD-10-CM

## 2020-05-05 ENCOUNTER — Other Ambulatory Visit (HOSPITAL_COMMUNITY)
Admission: RE | Admit: 2020-05-05 | Discharge: 2020-05-05 | Disposition: A | Discharge status: Home / Self Care | Payer: Self-pay | Source: Ambulatory Visit | Attending: Physician Assistant | Admitting: Physician Assistant

## 2020-05-05 ENCOUNTER — Other Ambulatory Visit: Payer: Self-pay

## 2020-05-05 DIAGNOSIS — E785 Hyperlipidemia, unspecified: Secondary | ICD-10-CM

## 2020-05-05 DIAGNOSIS — E1165 Type 2 diabetes mellitus with hyperglycemia: Secondary | ICD-10-CM

## 2020-05-05 DIAGNOSIS — I10 Essential (primary) hypertension: Secondary | ICD-10-CM

## 2020-05-05 LAB — LIPID PANEL
Cholesterol: 124 mg/dL (ref 0–200)
HDL: 36 mg/dL — ABNORMAL LOW (ref >40)
LDL Cholesterol: 48 mg/dL (ref 0–99)
Total CHOL/HDL Ratio: 3.4 RATIO
Triglycerides: 201 mg/dL — ABNORMAL HIGH (ref <150)
VLDL: 40 mg/dL (ref 0–40)

## 2020-05-05 LAB — COMPREHENSIVE METABOLIC PANEL
ALT: 38 U/L (ref 0–44)
AST: 37 U/L (ref 15–41)
Albumin: 4 g/dL (ref 3.5–5.0)
Alkaline Phosphatase: 65 U/L (ref 38–126)
Anion gap: 11 (ref 5–15)
BUN: 20 mg/dL (ref 6–20)
CO2: 24 mmol/L (ref 22–32)
Calcium: 9 mg/dL (ref 8.9–10.3)
Chloride: 102 mmol/L (ref 98–111)
Creatinine, Ser: 0.86 mg/dL (ref 0.44–1.00)
GFR, Estimated: >60 mL/min (ref >60)
Glucose, Bld: 214 mg/dL — ABNORMAL HIGH (ref 70–99)
Potassium: 4.7 mmol/L (ref 3.5–5.1)
Sodium: 137 mmol/L (ref 135–145)
Total Bilirubin: 0.7 mg/dL (ref 0.3–1.2)
Total Protein: 7.6 g/dL (ref 6.5–8.1)

## 2020-05-05 LAB — HEMOGLOBIN A1C
Hgb A1c MFr Bld: 7.9 % — ABNORMAL HIGH (ref 4.8–5.6)
Mean Plasma Glucose: 180.03 mg/dL

## 2020-05-07 ENCOUNTER — Encounter: Payer: Self-pay | Admitting: Physician Assistant

## 2020-05-07 ENCOUNTER — Ambulatory Visit: Payer: Self-pay | Admitting: Physician Assistant

## 2020-05-07 ENCOUNTER — Other Ambulatory Visit: Payer: Self-pay

## 2020-05-07 ENCOUNTER — Ambulatory Visit (HOSPITAL_COMMUNITY)
Admission: RE | Admit: 2020-05-07 | Discharge: 2020-05-07 | Disposition: A | Discharge status: Home / Self Care | Payer: Self-pay | Source: Ambulatory Visit | Attending: Physician Assistant | Admitting: Physician Assistant

## 2020-05-07 VITALS — BP 154/78 | HR 67 | Temp 97.7°F | Wt 177.0 lb

## 2020-05-07 DIAGNOSIS — M25511 Pain in right shoulder: Secondary | ICD-10-CM | POA: Insufficient documentation

## 2020-05-07 DIAGNOSIS — R109 Unspecified abdominal pain: Secondary | ICD-10-CM

## 2020-05-07 DIAGNOSIS — E785 Hyperlipidemia, unspecified: Secondary | ICD-10-CM

## 2020-05-07 DIAGNOSIS — Z789 Other specified health status: Secondary | ICD-10-CM

## 2020-05-07 DIAGNOSIS — R10A0 Flank pain, unspecified side: Secondary | ICD-10-CM

## 2020-05-07 DIAGNOSIS — E669 Obesity, unspecified: Secondary | ICD-10-CM

## 2020-05-07 DIAGNOSIS — I10 Essential (primary) hypertension: Secondary | ICD-10-CM

## 2020-05-07 DIAGNOSIS — E1165 Type 2 diabetes mellitus with hyperglycemia: Secondary | ICD-10-CM

## 2020-05-07 MED ORDER — DICLOFENAC SODIUM 1 % EX GEL: 2.0000 g | Freq: Four times a day (QID) | CUTANEOUS | 0 refills | Status: DC

## 2020-05-07 MED ORDER — NOVOLIN 70/30 RELION (70-30) 100 UNIT/ML ~~LOC~~ SUSP: SUBCUTANEOUS | 0 refills | Status: DC

## 2020-05-07 MED ORDER — LOSARTAN POTASSIUM 100 MG PO TABS: 100.0000 mg | ORAL_TABLET | Freq: Every day | ORAL | 4 refills | Status: DC

## 2020-05-07 NOTE — Progress Notes (Signed)
BP (!) 154/78   Pulse 67   Temp 97.7 F (36.5 C)   Wt 177 lb (80.3 kg)   LMP 02/15/2011 (Approximate)   BMI 32.37 kg/m    Subjective:    Patient ID: Amanda Zamora, female    DOB: [birth year], 60 y.o.   MRN: [id number]  HPI: Amanda Zamora is a 60 y.o. female presenting on 05/07/2020 for No chief complaint on file.   HPI    Pt had a negative covid 19 screening questionnaire.   Pt is 9yoF with appointment for DM, HTN and pain.  She is scheduled for laser surgery on her eye tomorrow.   She was supposed to increase insulin to 60 & 40units.  She only uses 40 if it's "high".  She is having pain on the right side.  She says hospital told her it would be chronic after her cholecystectomy surgery.   Abd/pelvis and chest CT done 03/31/20 show no cause for her pain.  She says right shoulder has been hurting for the past 3 weeks.  She did not injure it     Relevant past medical, surgical, family and social history reviewed and updated as indicated. Interim medical history since our last visit reviewed. Allergies and medications reviewed and updated.   Current Outpatient Medications:  .  insulin NPH-regular Human (NOVOLIN 70/30 RELION) (70-30) 100 UNIT/ML injection, Inject 50 units SQ before breakfast and 30-50 units before evening meal.   Injecte 50 unidades subcutaneamente dos veces al dia con el alimento y 30-50 unidades antes de el alimento de la tarde (Patient taking differently: Inject 70 units SQ before breakfast and 20-30 units before evening meal.   Injecte 70 unidades subcutaneamente dos veces al dia con el alimento y 20-30 unidades antes de el alimento de la tarde), Disp: 10 mL, Rfl: 0 .  metFORMIN (GLUCOPHAGE) 1000 MG tablet, TAKE 1 TABLET BY MOUTH TWICE DAILY WITH A MEAL.   Tome una tableta por boca dos veces diarias con comida, Disp: 180 tablet, Rfl: 0 .  Omega-3 Fatty Acids (FISH OIL) 1000 MG CAPS, Take 1 capsule (1,000 mg total) by mouth in the morning and at bedtime.,  Disp: 60 capsule, Rfl: 2 .  gabapentin (NEURONTIN) 300 MG capsule, Tome one capsule in am and lunchtime; then 2 capsules at bedtime., Disp: 120 capsule, Rfl: 3 .  irbesartan (AVAPRO) 300 MG tablet, Take 1 tablet (300 mg total) by mouth daily. Tome una tableta por boca diaria, Disp: 30 tablet, Rfl: 3 .  methocarbamol (ROBAXIN) 500 MG tablet, Take 1 tablet (500 mg total) by mouth every 8 (eight) hours as needed for muscle spasms., Disp: 45 tablet, Rfl: 0 .  omeprazole (PRILOSEC) 20 MG capsule, Take 1 capsule (20 mg total) by mouth daily. Tome una tableta por boca diaria, Disp: 90 capsule, Rfl: 1 .  oxyCODONE (ROXICODONE) 5 MG immediate release tablet, Take 1 tablet (5 mg total) by mouth every 8 (eight) hours as needed for severe pain., Disp: 20 tablet, Rfl: 0 .  rosuvastatin (CRESTOR) 20 MG tablet, Take 1 tablet (20 mg total) by mouth daily., Disp: 30 tablet, Rfl: 3    Review of Systems  Per HPI unless specifically indicated above     Objective:    BP (!) 154/78   Pulse 67   Temp 97.7 F (36.5 C)   Wt 177 lb (80.3 kg)   LMP 02/15/2011 (Approximate)   BMI 32.37 kg/m   Wt Readings from Last 3 Encounters:  05/07/20 177 lb (  80.3 kg)  03/31/20 172 lb 6.4 oz (78.2 kg)  02/18/20 172 lb (78 kg)    Physical Exam Vitals reviewed.  Constitutional:      General: She is not in acute distress.    Appearance: She is well-developed. She is not toxic-appearing.  HENT:     Head: Normocephalic and atraumatic.  Cardiovascular:     Rate and Rhythm: Normal rate and regular rhythm.  Pulmonary:     Effort: Pulmonary effort is normal.     Breath sounds: Normal breath sounds.  Abdominal:     General: Bowel sounds are normal.     Palpations: Abdomen is soft. There is no mass.     Tenderness: There is no abdominal tenderness. There is no guarding or rebound.     Comments: Mild tenderness right flank  Musculoskeletal:     Right shoulder: Tenderness present. No deformity or bony tenderness. Decreased  range of motion.     Left shoulder: Normal range of motion.     Cervical back: Neck supple.     Right lower leg: No edema.     Left lower leg: No edema.     Comments: Mild generalized tenderness.  Lymphadenopathy:     Cervical: No cervical adenopathy.  Skin:    General: Skin is warm and dry.  Neurological:     Mental Status: She is alert and oriented to person, place, and time.  Psychiatric:        Attention and Perception: Attention normal.        Speech: Speech normal.        Behavior: Behavior normal. Behavior is cooperative.     Results for orders placed or performed during the hospital encounter of 05/05/20  Hemoglobin A1c  Result Value Ref Range   Hgb A1c MFr Bld 7.9 (H) 4.8 - 5.6 %   Mean Plasma Glucose 180.03 mg/dL  Comprehensive metabolic panel  Result Value Ref Range   Sodium 137 135 - 145 mmol/L   Potassium 4.7 3.5 - 5.1 mmol/L   Chloride 102 98 - 111 mmol/L   CO2 24 22 - 32 mmol/L   Glucose, Bld 214 (H) 70 - 99 mg/dL   BUN 20 6 - 20 mg/dL   Creatinine, Ser 0.86 0.44 - 1.00 mg/dL   Calcium 9.0 8.9 - 10.3 mg/dL   Total Protein 7.6 6.5 - 8.1 g/dL   Albumin 4.0 3.5 - 5.0 g/dL   AST 37 15 - 41 U/L   ALT 38 0 - 44 U/L   Alkaline Phosphatase 65 38 - 126 U/L   Total Bilirubin 0.7 0.3 - 1.2 mg/dL   GFR, Estimated >60 >60 mL/min   Anion gap 11 5 - 15  Lipid panel  Result Value Ref Range   Cholesterol 124 0 - 200 mg/dL   Triglycerides 201 (H) <150 mg/dL   HDL 36 (L) >40 mg/dL   Total CHOL/HDL Ratio 3.4 RATIO   VLDL 40 0 - 40 mg/dL   LDL Cholesterol 48 0 - 99 mg/dL      Assessment & Plan:    Encounter Diagnoses  Name Primary?  . Right shoulder pain, unspecified chronicity Yes  . Uncontrolled type 2 diabetes mellitus with hyperglycemia ([place])   . Essential hypertension   . Not proficient in [place] language   . Hyperlipidemia, unspecified hyperlipidemia type   . Flank pain   . Obesity, unspecified classification, unspecified obesity type, unspecified  whether serious comorbidity present      -Reviewed labs  with pt -reviewed bs log.  No lows.  pt counseled to Use 60 & 40 units lantus every day.  Monitor fbs -will order Xray R shoulder  -gave sample voltaren gel and she was counseled on how to use it properly -will discontinue avapro and resume losartan as it is available in the pharmacy agaiin -pt to follow up 1 month.  She is to contact office sooner prn

## 2020-05-18 ENCOUNTER — Ambulatory Visit: Payer: Self-pay | Admitting: Physician Assistant

## 2020-06-10 ENCOUNTER — Ambulatory Visit: Payer: Self-pay | Admitting: Physician Assistant

## 2020-06-11 ENCOUNTER — Emergency Department (HOSPITAL_COMMUNITY)
Admission: EM | Admit: 2020-06-11 | Discharge: 2020-06-11 | Disposition: A | Discharge status: Home / Self Care | Payer: Self-pay | Attending: Emergency Medicine | Admitting: Emergency Medicine

## 2020-06-11 ENCOUNTER — Encounter: Payer: Self-pay | Admitting: Physician Assistant

## 2020-06-11 ENCOUNTER — Encounter (HOSPITAL_COMMUNITY): Payer: Self-pay | Admitting: *Deleted

## 2020-06-11 ENCOUNTER — Ambulatory Visit: Payer: Self-pay | Admitting: Physician Assistant

## 2020-06-11 VITALS — BP 141/81 | HR 62 | Temp 96.5°F | Wt 172.0 lb

## 2020-06-11 DIAGNOSIS — F329 Major depressive disorder, single episode, unspecified: Secondary | ICD-10-CM | POA: Insufficient documentation

## 2020-06-11 DIAGNOSIS — Z789 Other specified health status: Secondary | ICD-10-CM

## 2020-06-11 DIAGNOSIS — F32A Depression, unspecified: Secondary | ICD-10-CM

## 2020-06-11 DIAGNOSIS — I10 Essential (primary) hypertension: Secondary | ICD-10-CM

## 2020-06-11 DIAGNOSIS — Z7984 Long term (current) use of oral hypoglycemic drugs: Secondary | ICD-10-CM | POA: Insufficient documentation

## 2020-06-11 DIAGNOSIS — Z794 Long term (current) use of insulin: Secondary | ICD-10-CM | POA: Insufficient documentation

## 2020-06-11 DIAGNOSIS — F320 Major depressive disorder, single episode, mild: Secondary | ICD-10-CM

## 2020-06-11 DIAGNOSIS — Z79899 Other long term (current) drug therapy: Secondary | ICD-10-CM | POA: Insufficient documentation

## 2020-06-11 DIAGNOSIS — E1140 Type 2 diabetes mellitus with diabetic neuropathy, unspecified: Secondary | ICD-10-CM | POA: Insufficient documentation

## 2020-06-11 DIAGNOSIS — E1165 Type 2 diabetes mellitus with hyperglycemia: Secondary | ICD-10-CM

## 2020-06-11 DIAGNOSIS — M25511 Pain in right shoulder: Secondary | ICD-10-CM

## 2020-06-11 LAB — CBG MONITORING, ED: Glucose-Capillary: 215 mg/dL — ABNORMAL HIGH (ref 70–99)

## 2020-06-11 MED ORDER — LOSARTAN POTASSIUM 100 MG PO TABS: 100.0000 mg | ORAL_TABLET | Freq: Every day | ORAL | 1 refills | Status: DC

## 2020-06-11 MED ORDER — OMEPRAZOLE 20 MG PO CPDR: 20.0000 mg | DELAYED_RELEASE_CAPSULE | Freq: Every day | ORAL | 1 refills | Status: DC

## 2020-06-11 MED ORDER — METFORMIN HCL 1000 MG PO TABS: ORAL_TABLET | ORAL | 0 refills | Status: DC

## 2020-06-11 NOTE — Progress Notes (Signed)
BP (!) 141/81   Pulse 62   Temp (!) 96.5 F (35.8 C)   Wt 172 lb (78 kg)   LMP 02/15/2011 (Approximate)   SpO2 99%   BMI 31.46 kg/m    Subjective:    Patient ID: Amanda Zamora, female    DOB: [birth year], 60 y.o.   MRN: [id number]  HPI: Amanda Zamora is a 61 y.o. female presenting on 06/11/2020 for No chief complaint on file.   HPI   Pt had a negative covid 19 screening questionnaire.  Pt is a 20yoF who presents for follow up of DM and shoulder pain.   She had recent dental extraction  She is seeing eye dr at [facility].  She has f/u there on May 12  Pt has her bs log.  She is using 70/30 insulin, 70 units in morning and 20 or 30 units at ninght.  Some of her bs log readings are high because she didn't have enough $$ to get her insulin  Pt has been thinking about hurting herself, that things would be better if she was dead.  She has thought about killing herself with taking too many pills or cutting herself.    Pt on crestor due to lipitor made her feel bad.      Relevant past medical, surgical, family and social history reviewed and updated as indicated. Interim medical history since our last visit reviewed. Allergies and medications reviewed and updated.   Current Outpatient Medications:  .  diclofenac Sodium (VOLTAREN) 1 % GEL, Apply 2 g topically 4 (four) times daily., Disp: 20 g, Rfl: 0 .  gabapentin (NEURONTIN) 300 MG capsule, Tome one capsule in am and lunchtime; then 2 capsules at bedtime., Disp: 120 capsule, Rfl: 3 .  insulin NPH-regular Human (NOVOLIN 70/30 RELION) (70-30) 100 UNIT/ML injection, Inject 60 units SQ before breakfast and 40 units before evening meal.   Injecte 60 unidades subcutaneamente dos veces al dia con el alimento y 40 unidades antes de el alimento de la tarde, Disp: 10 mL, Rfl: 0 .  irbesartan (AVAPRO) 300 MG tablet, Take 300 mg by mouth daily., Disp: , Rfl:  .  metFORMIN (GLUCOPHAGE) 1000 MG tablet, TAKE 1 TABLET BY MOUTH TWICE DAILY  WITH A MEAL.   Tome una tableta por boca dos veces diarias con comida, Disp: 180 tablet, Rfl: 0 .  methocarbamol (ROBAXIN) 500 MG tablet, Take 1 tablet (500 mg total) by mouth every 8 (eight) hours as needed for muscle spasms., Disp: 45 tablet, Rfl: 0 .  Omega-3 Fatty Acids (FISH OIL) 1000 MG CAPS, Take 1 capsule (1,000 mg total) by mouth in the morning and at bedtime., Disp: 60 capsule, Rfl: 2 .  oxyCODONE (ROXICODONE) 5 MG immediate release tablet, Take 1 tablet (5 mg total) by mouth every 8 (eight) hours as needed for severe pain., Disp: 20 tablet, Rfl: 0 .  rosuvastatin (CRESTOR) 20 MG tablet, Take 1 tablet (20 mg total) by mouth daily., Disp: 30 tablet, Rfl: 3 .  losartan (COZAAR) 100 MG tablet, Take 1 tablet (100 mg total) by mouth daily. (Patient not taking: Reported on 06/11/2020), Disp: 30 tablet, Rfl: 4 .  omeprazole (PRILOSEC) 20 MG capsule, Take 1 capsule (20 mg total) by mouth daily. Tome una tableta por boca diaria (Patient not taking: Reported on 06/11/2020), Disp: 90 capsule, Rfl: 1    Review of Systems  Per HPI unless specifically indicated above     Objective:    BP (!) 141/81   Pulse  62   Temp (!) 96.5 F (35.8 C)   Wt 172 lb (78 kg)   LMP 02/15/2011 (Approximate)   SpO2 99%   BMI 31.46 kg/m   Wt Readings from Last 3 Encounters:  06/11/20 172 lb (78 kg)  05/07/20 177 lb (80.3 kg)  03/31/20 172 lb 6.4 oz (78.2 kg)    Physical Exam Vitals reviewed.  Constitutional:      General: She is not in acute distress.    Appearance: She is obese. She is not toxic-appearing.  HENT:     Head: Normocephalic and atraumatic.  Pulmonary:     Effort: Pulmonary effort is normal. No respiratory distress.  Skin:    General: Skin is warm and dry.  Neurological:     Mental Status: She is alert and oriented to person, place, and time.  Psychiatric:        Attention and Perception: Attention normal.        Mood and Affect: Mood is depressed. Affect is tearful. Affect is not  inappropriate.        Speech: Speech normal.        Behavior: Behavior is cooperative.             Assessment & Plan:   Encounter Diagnoses  Name Primary?  . Depression, unspecified depression type Yes  . Right shoulder pain, unspecified chronicity   . Uncontrolled type 2 diabetes mellitus with hyperglycemia ([place])   . Not proficient in [place] language   . Essential hypertension       -Pt to monitor bs.  She is given a box of strips wtihout charge. -The medications that are available at [facility] (at no cost) are sent there. -she was given sample voltaren gel -Restart losartan (stop irbesartan) -mobile crisis was contacted to come and evaluate pt SI -Pt is scheduled tor return here in 2 wk for dm and shoulder

## 2020-06-11 NOTE — Patient Instructions (Signed)
Stop irbesartan and restart losartan

## 2020-06-11 NOTE — ED Provider Notes (Signed)
[facility] Provider Note   CSN: [id number] Arrival date & time: 06/11/20  1421     History Chief Complaint  Patient presents with  . V70.1    Amanda Zamora is a 60 y.o. female.  Patient was sent over here from clinic with history of depression and questionable suicidal thoughts.  The history is provided by the patient and medical records. No language interpreter was used.  Altered Mental Status Presenting symptoms: behavior changes   Severity:  Mild Most recent episode:  More than 2 days ago (Number days ago) Episode history:  Single Timing:  Intermittent Progression:  Improving Associated symptoms: no abdominal pain, no hallucinations, no headaches, no rash and no seizures        Past Medical History:  Diagnosis Date  . Detached retina   . GERD (gastroesophageal reflux disease)   . History of pyelonephritis   . PONV (postoperative nausea and vomiting)   . Type 2 diabetes mellitus [facility])     Patient Active Problem List   Diagnosis Date Noted  . Abdominal pain 04/01/2020  . Leukocytosis 04/01/2020  . AKI (acute kidney injury) ([place]) 04/01/2020  . Hypercalcemia 04/01/2020  . Hypoglycemia 03/31/2020  . Chronic cholecystitis 12/19/2019  . Abdominal pain, right upper quadrant   . Abnormal mammogram 10/10/2018  . Type 2 diabetes mellitus with diabetic neuropathy, unspecified ([place]) 08/01/2018  . Not proficient in [place] language 08/01/2018  . Screening for colon cancer 08/01/2018  . Hyperlipidemia 03/10/2015  . HTN (hypertension) 03/10/2015  . Obesity, unspecified 03/10/2015  . GERD (gastroesophageal reflux disease) 03/10/2015  . Precordial pain 03/28/2013  . Type 2 diabetes mellitus ([place]) 03/28/2013    Past Surgical History:  Procedure Laterality Date  . CHOLECYSTECTOMY N/A 12/23/2019   Procedure: LAPAROSCOPIC CHOLECYSTECTOMY;  Surgeon: Virl Cagey, MD;  Location: [facility];  Service: General;  Laterality: N/A;  . EYE SURGERY Right     retina  . TUBAL LIGATION       OB History   No obstetric history on file.     Family History  Problem Relation Age of Onset  . Breast cancer Sister   . Diabetes Sister     Social History   Tobacco Use  . Smoking status: Never Smoker  . Smokeless tobacco: Never Used  Vaping Use  . Vaping Use: Never used  Substance Use Topics  . Alcohol use: Not Currently    Comment: previously would drink occ. beer last drink 06/2017  . Drug use: No    Home Medications Prior to Admission medications   Medication Sig Start Date End Date Taking? Authorizing Provider  diclofenac Sodium (VOLTAREN) 1 % GEL Apply 2 g topically 4 (four) times daily. 05/07/20   Soyla Dryer, PA-C  gabapentin (NEURONTIN) 300 MG capsule Tome one capsule in am and lunchtime; then 2 capsules at bedtime. 04/01/20   Barton Dubois, MD  insulin NPH-regular Human (NOVOLIN 70/30 RELION) (70-30) 100 UNIT/ML injection Inject 60 units SQ before breakfast and 40 units before evening meal.   Injecte 60 unidades subcutaneamente dos veces al dia con el alimento y 40 unidades antes de el alimento de la tarde 05/07/20   Soyla Dryer, PA-C  losartan (COZAAR) 100 MG tablet Take 1 tablet (100 mg total) by mouth daily. Tome una tableta por boca diaria 06/11/20   Soyla Dryer, PA-C  metFORMIN (GLUCOPHAGE) 1000 MG tablet TAKE 1 TABLET BY MOUTH TWICE DAILY WITH A MEAL.   Tome una tableta por [employer] veces diarias  con comida 06/11/20   Soyla Dryer, PA-C  methocarbamol (ROBAXIN) 500 MG tablet Take 1 tablet (500 mg total) by mouth every 8 (eight) hours as needed for muscle spasms. 04/01/20   Barton Dubois, MD  Omega-3 Fatty Acids (FISH OIL) 1000 MG CAPS Take 1 capsule (1,000 mg total) by mouth in the morning and at bedtime. 04/01/20   Barton Dubois, MD  omeprazole (PRILOSEC) 20 MG capsule Take 1 capsule (20 mg total) by mouth daily. Tome una tableta por boca diaria 06/11/20   Soyla Dryer, PA-C  oxyCODONE (ROXICODONE) 5 MG  immediate release tablet Take 1 tablet (5 mg total) by mouth every 8 (eight) hours as needed for severe pain. 04/01/20 04/01/21  Barton Dubois, MD  rosuvastatin (CRESTOR) 20 MG tablet Take 1 tablet (20 mg total) by mouth daily. 04/01/20 04/01/21  Barton Dubois, MD    Allergies    Patient has no known allergies.  Review of Systems   Review of Systems  Constitutional: Negative for appetite change and fatigue.  HENT: Negative for congestion, ear discharge and sinus pressure.   Eyes: Negative for discharge.  Respiratory: Negative for cough.   Cardiovascular: Negative for chest pain.  Gastrointestinal: Negative for abdominal pain and diarrhea.  Genitourinary: Negative for frequency and hematuria.  Musculoskeletal: Negative for back pain.  Skin: Negative for rash.  Neurological: Negative for seizures and headaches.  Psychiatric/Behavioral: Positive for dysphoric mood. Negative for hallucinations.    Physical Exam Updated Vital Signs BP (!) 148/68   Pulse 82   Temp 98.6 F (37 C) (Oral)   Resp 18   Ht 5\' 5"  (1.651 m)   Wt 71.7 kg   LMP 02/15/2011 (Approximate)   SpO2 98%   BMI 26.29 kg/m   Physical Exam Vitals and nursing note reviewed.  Constitutional:      Appearance: She is well-developed.  HENT:     Head: Normocephalic.     Nose: Nose normal.  Eyes:     General: No scleral icterus.    Conjunctiva/sclera: Conjunctivae normal.  Neck:     Thyroid: No thyromegaly.  Cardiovascular:     Rate and Rhythm: Normal rate and regular rhythm.     Heart sounds: No murmur heard. No friction rub. No gallop.   Pulmonary:     Breath sounds: No stridor. No wheezing or rales.  Chest:     Chest wall: No tenderness.  Abdominal:     General: There is no distension.     Tenderness: There is no abdominal tenderness. There is no rebound.  Musculoskeletal:        General: Normal range of motion.     Cervical back: Neck supple.  Lymphadenopathy:     Cervical: No cervical adenopathy.   Skin:    Findings: No erythema or rash.  Neurological:     Mental Status: She is alert and oriented to person, place, and time.     Motor: No abnormal muscle tone.     Coordination: Coordination normal.  Psychiatric:     Comments: Depression but not suicidal or homicidal.  Patient having no hallucination     ED Results / Procedures / Treatments   Labs (all labs ordered are listed, but only abnormal results are displayed) Labs Reviewed  CBG MONITORING, ED - Abnormal; Notable for the following components:      Result Value   Glucose-Capillary 215 (*)    All other components within normal limits    EKG None  Radiology No results found.  Procedures Procedures   Medications Ordered in ED Medications - No data to display  ED Course  I have reviewed the triage vital signs and the nursing notes.  Pertinent labs & imaging results that were available during my care of the patient were reviewed by me and considered in my medical decision making (see chart for details).    MDM Rules/Calculators/A&P                         Patient with situational depression.  She is not suicidal or homicidal.  She has been referred to outpatient treatment at [facility] Impression(s) / ED Diagnoses Final diagnoses:  None    Rx / DC Orders ED Discharge Orders    None       Milton Ferguson, MD 06/11/20 1529

## 2020-06-11 NOTE — ED Triage Notes (Signed)
SUICIDAL THOUGHT, SENT [facility]

## 2020-06-11 NOTE — Discharge Instructions (Addendum)
Follow up with [facility] this week to help with your depression.   You can call for an appointment or just show up

## 2020-06-24 ENCOUNTER — Ambulatory Visit: Payer: Self-pay | Admitting: Physician Assistant

## 2020-06-29 ENCOUNTER — Telehealth: Payer: Self-pay

## 2020-06-29 ENCOUNTER — Ambulatory Visit: Payer: Self-pay | Admitting: Physician Assistant

## 2020-06-29 NOTE — Telephone Encounter (Addendum)
Called [facility] [employer] for client to her [facility] appointment on 07/02/20 at 10 am as client is having issues with her vehicle. Will plan to meet client after appointment to obtain transportation waiver.   [place] [employer]

## 2020-07-02 ENCOUNTER — Ambulatory Visit: Payer: Self-pay | Admitting: Physician Assistant

## 2020-07-04 ENCOUNTER — Other Ambulatory Visit: Payer: Self-pay

## 2020-07-04 ENCOUNTER — Inpatient Hospital Stay (HOSPITAL_COMMUNITY)
Admission: EM | Admit: 2020-07-04 | Discharge: 2020-07-06 | DRG: 343 | Disposition: A | Discharge status: Home / Self Care | Payer: Self-pay | Attending: General Surgery | Admitting: General Surgery

## 2020-07-04 ENCOUNTER — Encounter (HOSPITAL_COMMUNITY): Payer: Self-pay | Admitting: *Deleted

## 2020-07-04 ENCOUNTER — Emergency Department (HOSPITAL_COMMUNITY): Payer: Self-pay

## 2020-07-04 DIAGNOSIS — R109 Unspecified abdominal pain: Secondary | ICD-10-CM | POA: Diagnosis present

## 2020-07-04 DIAGNOSIS — K3580 Unspecified acute appendicitis: Secondary | ICD-10-CM | POA: Diagnosis present

## 2020-07-04 DIAGNOSIS — R1031 Right lower quadrant pain: Secondary | ICD-10-CM

## 2020-07-04 DIAGNOSIS — R112 Nausea with vomiting, unspecified: Secondary | ICD-10-CM

## 2020-07-04 DIAGNOSIS — G8929 Other chronic pain: Secondary | ICD-10-CM | POA: Diagnosis present

## 2020-07-04 DIAGNOSIS — Z87440 Personal history of urinary (tract) infections: Secondary | ICD-10-CM

## 2020-07-04 DIAGNOSIS — K3589 Other acute appendicitis without perforation or gangrene: Principal | ICD-10-CM | POA: Diagnosis present

## 2020-07-04 DIAGNOSIS — E785 Hyperlipidemia, unspecified: Secondary | ICD-10-CM | POA: Diagnosis present

## 2020-07-04 DIAGNOSIS — E1165 Type 2 diabetes mellitus with hyperglycemia: Secondary | ICD-10-CM

## 2020-07-04 DIAGNOSIS — I10 Essential (primary) hypertension: Secondary | ICD-10-CM | POA: Diagnosis present

## 2020-07-04 DIAGNOSIS — E782 Mixed hyperlipidemia: Secondary | ICD-10-CM

## 2020-07-04 DIAGNOSIS — Z833 Family history of diabetes mellitus: Secondary | ICD-10-CM

## 2020-07-04 DIAGNOSIS — Z20822 Contact with and (suspected) exposure to covid-19: Secondary | ICD-10-CM | POA: Diagnosis present

## 2020-07-04 DIAGNOSIS — K219 Gastro-esophageal reflux disease without esophagitis: Secondary | ICD-10-CM | POA: Diagnosis present

## 2020-07-04 DIAGNOSIS — R1110 Vomiting, unspecified: Secondary | ICD-10-CM

## 2020-07-04 LAB — CBC WITH DIFFERENTIAL/PLATELET
Abs Immature Granulocytes: 0.03 10*3/uL (ref 0.00–0.07)
Basophils Absolute: 0 10*3/uL (ref 0.0–0.1)
Basophils Relative: 0 %
Eosinophils Absolute: 0.3 10*3/uL (ref 0.0–0.5)
Eosinophils Relative: 3 %
HCT: 38.5 % (ref 36.0–46.0)
Hemoglobin: 12.6 g/dL (ref 12.0–15.0)
Immature Granulocytes: 0 %
Lymphocytes Relative: 16 %
Lymphs Abs: 1.4 10*3/uL (ref 0.7–4.0)
MCH: 29.7 pg (ref 26.0–34.0)
MCHC: 32.7 g/dL (ref 30.0–36.0)
MCV: 90.8 fL (ref 80.0–100.0)
Monocytes Absolute: 0.3 10*3/uL (ref 0.1–1.0)
Monocytes Relative: 4 %
Neutro Abs: 6.6 10*3/uL (ref 1.7–7.7)
Neutrophils Relative %: 77 %
Platelets: 283 10*3/uL (ref 150–400)
RBC: 4.24 MIL/uL (ref 3.87–5.11)
RDW: 13.8 % (ref 11.5–15.5)
WBC: 8.7 10*3/uL (ref 4.0–10.5)
nRBC: 0 % (ref 0.0–0.2)

## 2020-07-04 LAB — COMPREHENSIVE METABOLIC PANEL
ALT: 20 U/L (ref 0–44)
AST: 19 U/L (ref 15–41)
Albumin: 4.4 g/dL (ref 3.5–5.0)
Alkaline Phosphatase: 75 U/L (ref 38–126)
Anion gap: 9 (ref 5–15)
BUN: 15 mg/dL (ref 6–20)
CO2: 26 mmol/L (ref 22–32)
Calcium: 9.9 mg/dL (ref 8.9–10.3)
Chloride: 102 mmol/L (ref 98–111)
Creatinine, Ser: 0.98 mg/dL (ref 0.44–1.00)
GFR, Estimated: >60 mL/min (ref >60)
Glucose, Bld: 151 mg/dL — ABNORMAL HIGH (ref 70–99)
Potassium: 4 mmol/L (ref 3.5–5.1)
Sodium: 137 mmol/L (ref 135–145)
Total Bilirubin: 0.8 mg/dL (ref 0.3–1.2)
Total Protein: 8 g/dL (ref 6.5–8.1)

## 2020-07-04 LAB — LIPASE, BLOOD: Lipase: 23 U/L (ref 11–51)

## 2020-07-04 MED ORDER — ONDANSETRON HCL 4 MG/2ML IJ SOLN
4.0000 mg | Freq: Once | INTRAMUSCULAR | Status: AC
  Administered 2020-07-04: 4 mg via INTRAVENOUS
  Filled 2020-07-04: qty 2

## 2020-07-04 MED ORDER — HYDROMORPHONE HCL 1 MG/ML IJ SOLN
0.5000 mg | INTRAMUSCULAR | Status: DC | PRN
  Administered 2020-07-05 (×3): 0.5 mg via INTRAVENOUS
  Filled 2020-07-04 (×2): qty 0.5
  Filled 2020-07-04: qty 1

## 2020-07-04 MED ORDER — HYDROMORPHONE HCL 1 MG/ML IJ SOLN
1.0000 mg | Freq: Once | INTRAMUSCULAR | Status: AC
  Administered 2020-07-04: 1 mg via INTRAVENOUS
  Filled 2020-07-04: qty 1

## 2020-07-04 MED ORDER — SODIUM CHLORIDE 0.9 % IV BOLUS
2000.0000 mL | Freq: Once | INTRAVENOUS | Status: AC
  Administered 2020-07-04: 2000 mL via INTRAVENOUS

## 2020-07-04 NOTE — ED Provider Notes (Signed)
Emergency Medicine Provider Triage Evaluation Note  Amanda Zamora , a 60 y.o. female  was evaluated in triage.  Pt complains of diffuse right-sided abdominal pain with onset this morning.  Pain has been associated with nausea vomiting and diarrhea.  Pain has been persistent throughout the day.  No flank pain.  History limited due to language barrier..  Review of Systems  Positive: Abdominal pain, nausea, vomiting, diarrhea Negative: Fever, chills, flank pain  Physical Exam  BP 138/75   Pulse 80   Temp 98.4 F (36.9 C) (Oral)   Resp (!) 22   Ht 5\' 5"  (1.651 m)   Wt 71.6 kg   LMP 02/15/2011 (Approximate)   SpO2 100%   BMI 26.27 kg/m  Gen:   Awake, grimacing and holding her right lower abdomen. Resp:  Normal effort, lungs clear to auscultation bilaterally MSK:   Moves extremities without difficulty  Other:  Diffuse tenderness to palpation of the right abdomen.  Abdomen appears distended.  Medical Decision Making  Medically screening exam initiated at 6:19 PM.  Appropriate orders placed.  Amanda Zamora was informed that the remainder of the evaluation will be completed by another provider, this initial triage assessment does not replace that evaluation, and the importance of remaining in the ED until their evaluation is complete.  Patient with right-sided abdominal pain, vomiting and diarrhea.  Will need further evaluation in the emergency department.  Emergent placement to a room requested and orders placed.   Kem Parkinson, PA-C 07/04/20 Milus Mallick, MD 07/04/20 [phone]

## 2020-07-04 NOTE — ED Provider Notes (Signed)
[facility] EMERGENCY DEPARTMENT Provider Note   CSN: [id number] Arrival date & time: 07/04/20  1711     History Chief Complaint  Patient presents with  . Abdominal Pain    Right side    Amanda Zamora is a 60 y.o. female.  Patient complains of nausea diarrhea and abdominal pain which all started around noon today.  The history is provided by the patient and medical records.  Abdominal Pain Pain location:  Generalized Pain quality: aching   Pain radiates to:  Does not radiate Pain severity:  Moderate Onset quality:  Sudden Timing:  Constant Progression:  Worsening Chronicity:  New Associated symptoms: no chest pain, no cough, no diarrhea, no fatigue and no hematuria        Past Medical History:  Diagnosis Date  . Detached retina   . GERD (gastroesophageal reflux disease)   . History of pyelonephritis   . PONV (postoperative nausea and vomiting)   . Type 2 diabetes mellitus [facility])     Patient Active Problem List   Diagnosis Date Noted  . Acute appendicitis 07/04/2020  . Abdominal pain 04/01/2020  . Leukocytosis 04/01/2020  . AKI (acute kidney injury) ([place]) 04/01/2020  . Hypercalcemia 04/01/2020  . Hypoglycemia 03/31/2020  . Chronic cholecystitis 12/19/2019  . Abdominal pain, right upper quadrant   . Abnormal mammogram 10/10/2018  . Type 2 diabetes mellitus with diabetic neuropathy, unspecified ([place]) 08/01/2018  . Not proficient in [place] language 08/01/2018  . Screening for colon cancer 08/01/2018  . Hyperlipidemia 03/10/2015  . HTN (hypertension) 03/10/2015  . Obesity, unspecified 03/10/2015  . GERD (gastroesophageal reflux disease) 03/10/2015  . Precordial pain 03/28/2013  . Type 2 diabetes mellitus ([place]) 03/28/2013    Past Surgical History:  Procedure Laterality Date  . CHOLECYSTECTOMY N/A 12/23/2019   Procedure: LAPAROSCOPIC CHOLECYSTECTOMY;  Surgeon: Virl Cagey, MD;  Location: [facility];  Service: General;  Laterality: N/A;  . EYE SURGERY  Right    retina  . TUBAL LIGATION       OB History   No obstetric history on file.     Family History  Problem Relation Age of Onset  . Breast cancer Sister   . Diabetes Sister     Social History   Tobacco Use  . Smoking status: Never Smoker  . Smokeless tobacco: Never Used  Vaping Use  . Vaping Use: Never used  Substance Use Topics  . Alcohol use: Not Currently    Comment: previously would drink occ. beer last drink 06/2017  . Drug use: No    Home Medications Prior to Admission medications   Medication Sig Start Date End Date Taking? Authorizing Provider  diclofenac Sodium (VOLTAREN) 1 % GEL Apply 2 g topically 4 (four) times daily. 05/07/20   Soyla Dryer, PA-C  gabapentin (NEURONTIN) 300 MG capsule Tome one capsule in am and lunchtime; then 2 capsules at bedtime. 04/01/20   Barton Dubois, MD  insulin NPH-regular Human (NOVOLIN 70/30 RELION) (70-30) 100 UNIT/ML injection Inject 60 units SQ before breakfast and 40 units before evening meal.   Injecte 60 unidades subcutaneamente dos veces al dia con el alimento y 40 unidades antes de el alimento de la tarde 05/07/20   Soyla Dryer, PA-C  losartan (COZAAR) 100 MG tablet Take 1 tablet (100 mg total) by mouth daily. Tome una tableta por boca diaria 06/11/20   Soyla Dryer, PA-C  metFORMIN (GLUCOPHAGE) 1000 MG tablet TAKE 1 TABLET BY MOUTH TWICE DAILY WITH A MEAL.   [employer]  tableta por boca dos veces diarias con comida 06/11/20   Soyla Dryer, PA-C  methocarbamol (ROBAXIN) 500 MG tablet Take 1 tablet (500 mg total) by mouth every 8 (eight) hours as needed for muscle spasms. 04/01/20   Barton Dubois, MD  Omega-3 Fatty Acids (FISH OIL) 1000 MG CAPS Take 1 capsule (1,000 mg total) by mouth in the morning and at bedtime. 04/01/20   Barton Dubois, MD  omeprazole (PRILOSEC) 20 MG capsule Take 1 capsule (20 mg total) by mouth daily. Tome una tableta por boca diaria 06/11/20   Soyla Dryer, PA-C  oxyCODONE (ROXICODONE) 5  MG immediate release tablet Take 1 tablet (5 mg total) by mouth every 8 (eight) hours as needed for severe pain. 04/01/20 04/01/21  Barton Dubois, MD  rosuvastatin (CRESTOR) 20 MG tablet Take 1 tablet (20 mg total) by mouth daily. 04/01/20 04/01/21  Barton Dubois, MD    Allergies    Patient has no known allergies.  Review of Systems   Review of Systems  Constitutional: Negative for appetite change and fatigue.  HENT: Negative for congestion, ear discharge and sinus pressure.   Eyes: Negative for discharge.  Respiratory: Negative for cough.   Cardiovascular: Negative for chest pain.  Gastrointestinal: Positive for abdominal pain. Negative for diarrhea.  Genitourinary: Negative for frequency and hematuria.  Musculoskeletal: Negative for back pain.  Skin: Negative for rash.  Neurological: Negative for seizures and headaches.  Psychiatric/Behavioral: Negative for hallucinations.    Physical Exam Updated Vital Signs BP 133/64   Pulse 83   Temp 98.4 F (36.9 C) (Oral)   Resp 18   Ht 5\' 5"  (1.651 m)   Wt 71.6 kg   LMP 02/15/2011 (Approximate)   SpO2 100%   BMI 26.27 kg/m   Physical Exam Vitals and nursing note reviewed.  Constitutional:      Appearance: She is well-developed.  HENT:     Head: Normocephalic.     Nose: Nose normal.  Eyes:     General: No scleral icterus.    Conjunctiva/sclera: Conjunctivae normal.  Neck:     Thyroid: No thyromegaly.  Cardiovascular:     Rate and Rhythm: Normal rate and regular rhythm.     Heart sounds: No murmur heard. No friction rub. No gallop.   Pulmonary:     Breath sounds: No stridor. No wheezing or rales.  Chest:     Chest wall: No tenderness.  Abdominal:     General: There is no distension.     Tenderness: There is abdominal tenderness. There is no rebound.     Comments: Tender right lower quadrant  Musculoskeletal:        General: Normal range of motion.     Cervical back: Neck supple.  Lymphadenopathy:     Cervical: No  cervical adenopathy.  Skin:    Findings: No erythema or rash.  Neurological:     Mental Status: She is alert and oriented to person, place, and time.     Motor: No abnormal muscle tone.     Coordination: Coordination normal.  Psychiatric:        Behavior: Behavior normal.     ED Results / Procedures / Treatments   Labs (all labs ordered are listed, but only abnormal results are displayed) Labs Reviewed  COMPREHENSIVE METABOLIC PANEL - Abnormal; Notable for the following components:      Result Value   Glucose, Bld 151 (*)    All other components within normal limits  CBC WITH DIFFERENTIAL/PLATELET  LIPASE,  BLOOD  URINALYSIS, ROUTINE W REFLEX MICROSCOPIC    EKG None  Radiology CT ABDOMEN PELVIS WO CONTRAST  Result Date: 07/04/2020 CLINICAL DATA:  Right-sided abdominal pain. EXAM: CT ABDOMEN AND PELVIS WITHOUT CONTRAST TECHNIQUE: Multidetector CT imaging of the abdomen and pelvis was performed following the standard protocol without IV contrast. COMPARISON:  March 31, 2020. FINDINGS: Lower chest: No acute abnormality. Hepatobiliary: No focal liver abnormality is seen. Status post cholecystectomy. No biliary dilatation. Pancreas: Unremarkable. No pancreatic ductal dilatation or surrounding inflammatory changes. Spleen: Normal in size without focal abnormality. Adrenals/Urinary Tract: Adrenal glands appear normal. Right renal cyst is noted. No hydronephrosis or renal obstruction is noted. No renal or ureteral calculi are noted. Urinary bladder is unremarkable. Stomach/Bowel: The stomach appears normal. There is no evidence of bowel obstruction. The appendix is enlarged with surrounding inflammation consistent with acute appendicitis. No definite abscess is noted. Appendix: Location: Right lower quadrant. Diameter: 10 mm. Appendicolith: No. Mucosal hyper-enhancement: Unenhanced scan. Extraluminal gas: No. Periappendiceal collection: No. Vascular/Lymphatic: Aortic atherosclerosis. No  enlarged abdominal or pelvic lymph nodes. Reproductive: Uterus and bilateral adnexa are unremarkable. Other: No abdominal wall hernia or abnormality. No abdominopelvic ascites. Musculoskeletal: No acute or significant osseous findings. IMPRESSION: Findings consistent with acute appendicitis. No definite abscess is noted. Electronically Signed   By: Marijo Conception M.D.   On: 07/04/2020 21:25    Procedures Procedures   Medications Ordered in ED Medications  HYDROmorphone (DILAUDID) injection 1 mg (1 mg Intravenous Given 07/04/20 1846)  ondansetron (ZOFRAN) injection 4 mg (4 mg Intravenous Given 07/04/20 1845)  sodium chloride 0.9 % bolus 2,000 mL (2,000 mLs Intravenous New Bag/Given 07/04/20 2049)  ondansetron (ZOFRAN) injection 4 mg (4 mg Intravenous Given 07/04/20 2044)  HYDROmorphone (DILAUDID) injection 1 mg (1 mg Intravenous Given 07/04/20 2045)    ED Course  I have reviewed the triage vital signs and the nursing notes.  Pertinent labs & imaging results that were available during my care of the patient were reviewed by me and considered in my medical decision making (see chart for details).    MDM Rules/Calculators/A&P                          Patient with appendicitis.  She will be admitted to medicine and Dr. Arnoldo Morale the general surgeon will take her appendix out at 9 AM tomorrow. Final Clinical Impression(s) / ED Diagnoses Final diagnoses:  Other acute appendicitis    Rx / DC Orders ED Discharge Orders    None       Milton Ferguson, MD 07/04/20 2224

## 2020-07-04 NOTE — H&P (Signed)
History and Physical  Amanda Zamora TOI:[id number] DOB: 1960-09-19 DOA: 07/04/2020  Referring physician: Milton Ferguson, MD PCP: Soyla Dryer, PA-C  Patient coming from: Home  Chief Complaint: Abdominal pain   HPI: Amanda Zamora is a 59 y.o. female with medical history significant for GERD, T2DM, hypertension, hyperlipidemia who presents to the emergency department due to diffuse lower right-sided abdominal pain which started this morning, pain was persistent and crampy in nature, it was rated as > 10/10 on pain scale and it was associated with, vomiting.  There was no known alleviating or aggravating factor, so she presented to the ED for further evaluation. She was admitted from 2/15 to 02/16 due to hypoglycemia and right upper quadrant abdominal pain/precordial discomfort which she stated was due to ongoing pain since cholecystectomy in November 2021.  She denies fever, chills, chest pain, shortness of breath, sick contacts.  ED Course:  In the emergency department, she was hemodynamically stable.  Work-up in the ED showed normal CBC and BMP except for hyperglycemia.  Lipase was 23.  Influenza A, B and SARS coronavirus 2 was negative.  CT abdomen and pelvis without contrast showed findings consistent with acute appendicitis.  No definite abscess was noted. She was treated with pain medication and Zofran, IV hydration was provided.  General surgery was consulted and will see patient in the morning.  Hospitalist was asked to admit patient for further evaluation and management.  Review of Systems: Constitutional: Negative for chills and fever.  HENT: Negative for ear pain and sore throat.   Eyes: Negative for pain and visual disturbance.  Respiratory: Negative for cough, chest tightness and shortness of breath.   Cardiovascular: Negative for chest pain and palpitations.  Gastrointestinal: Positive for abdominal pain and vomiting.  Endocrine: Negative for polyphagia and polyuria.   Genitourinary: Negative for decreased urine volume, dysuria, enuresis Musculoskeletal: Negative for arthralgias and back pain.  Skin: Negative for color change and rash.  Allergic/Immunologic: Negative for immunocompromised state.  Neurological: Negative for tremors, syncope, speech difficulty, weakness, light-headedness and headaches.  Hematological: Does not bruise/bleed easily.  All other systems reviewed and are negative   Past Medical History:  Diagnosis Date  . Detached retina   . GERD (gastroesophageal reflux disease)   . History of pyelonephritis   . PONV (postoperative nausea and vomiting)   . Type 2 diabetes mellitus ([place])    Past Surgical History:  Procedure Laterality Date  . CHOLECYSTECTOMY N/A 12/23/2019   Procedure: LAPAROSCOPIC CHOLECYSTECTOMY;  Surgeon: Virl Cagey, MD;  Location: [facility];  Service: General;  Laterality: N/A;  . EYE SURGERY Right    retina  . TUBAL LIGATION      Social History:  reports that she has never smoked. She has never used smokeless tobacco. She reports previous alcohol use. She reports that she does not use drugs.   No Known Allergies  Family History  Problem Relation Age of Onset  . Breast cancer Sister   . Diabetes Sister      Prior to Admission medications   Medication Sig Start Date End Date Taking? Authorizing Provider  diclofenac Sodium (VOLTAREN) 1 % GEL Apply 2 g topically 4 (four) times daily. 05/07/20   Soyla Dryer, PA-C  gabapentin (NEURONTIN) 300 MG capsule Tome one capsule in am and lunchtime; then 2 capsules at bedtime. 04/01/20   Barton Dubois, MD  insulin NPH-regular Human (NOVOLIN 70/30 RELION) (70-30) 100 UNIT/ML injection Inject 60 units SQ before breakfast and 40 units before evening meal.  Injecte 60 unidades subcutaneamente dos veces al dia con el alimento y 40 unidades antes de el alimento de la tarde 05/07/20   Soyla Dryer, PA-C  losartan (COZAAR) 100 MG tablet Take 1 tablet (100 mg total) by  mouth daily. Tome una tableta por boca diaria 06/11/20   Soyla Dryer, PA-C  metFORMIN (GLUCOPHAGE) 1000 MG tablet TAKE 1 TABLET BY MOUTH TWICE DAILY WITH A MEAL.   Tome una tableta por boca dos veces diarias con comida 06/11/20   Soyla Dryer, PA-C  methocarbamol (ROBAXIN) 500 MG tablet Take 1 tablet (500 mg total) by mouth every 8 (eight) hours as needed for muscle spasms. 04/01/20   Barton Dubois, MD  Omega-3 Fatty Acids (FISH OIL) 1000 MG CAPS Take 1 capsule (1,000 mg total) by mouth in the morning and at bedtime. 04/01/20   Barton Dubois, MD  omeprazole (PRILOSEC) 20 MG capsule Take 1 capsule (20 mg total) by mouth daily. Tome una tableta por boca diaria 06/11/20   Soyla Dryer, PA-C  oxyCODONE (ROXICODONE) 5 MG immediate release tablet Take 1 tablet (5 mg total) by mouth every 8 (eight) hours as needed for severe pain. 04/01/20 04/01/21  Barton Dubois, MD  rosuvastatin (CRESTOR) 20 MG tablet Take 1 tablet (20 mg total) by mouth daily. 04/01/20 04/01/21  Barton Dubois, MD    Physical Exam: BP (!) 153/73 (BP Location: Right Arm)   Pulse 76   Temp 98.6 F (37 C)   Resp 18   Ht 5\' 5"  (1.651 m)   Wt 71.6 kg   LMP 02/15/2011 (Approximate)   SpO2 90%   BMI 26.27 kg/m   . General: 60 y.o. year-old female well developed well nourished in no acute distress.  Alert and oriented x3. Marland Kitchen HEENT: NCAT, EOMI . Neck: Supple, trachea medial . Cardiovascular: Regular rate and rhythm with no rubs or gallops.  No thyromegaly or JVD noted.  No lower extremity edema. 2/4 pulses in all 4 extremities. Marland Kitchen Respiratory: Clear to auscultation with no wheezes or rales. Good inspiratory effort. . Abdomen: Soft, tender to palpation in RLQ with guarding.  Positive Rovsing sign.  Muskuloskeletal: No cyanosis, clubbing or edema noted bilaterally . Neuro: CN II-XII intact, strength 5/5 x 4, sensation, reflexes intact . Skin: No ulcerative lesions noted or rashes . Psychiatry: Judgement and insight appear normal.  Mood is appropriate for condition and setting          Labs on Admission:  Basic Metabolic Panel: Recent Labs  Lab 07/04/20 1839  NA 137  K 4.0  CL 102  CO2 26  GLUCOSE 151*  BUN 15  CREATININE 0.98  CALCIUM 9.9   Liver Function Tests: Recent Labs  Lab 07/04/20 1839  AST 19  ALT 20  ALKPHOS 75  BILITOT 0.8  PROT 8.0  ALBUMIN 4.4   Recent Labs  Lab 07/04/20 1839  LIPASE 23   No results for input(s): AMMONIA in the last 168 hours. CBC: Recent Labs  Lab 07/04/20 1839  WBC 8.7  NEUTROABS 6.6  HGB 12.6  HCT 38.5  MCV 90.8  PLT 283   Cardiac Enzymes: No results for input(s): CKTOTAL, CKMB, CKMBINDEX, TROPONINI in the last 168 hours.  BNP (last 3 results) No results for input(s): BNP in the last 8760 hours.  ProBNP (last 3 results) No results for input(s): PROBNP in the last 8760 hours.  CBG: No results for input(s): GLUCAP in the last 168 hours.  Radiological Exams on Admission: CT ABDOMEN PELVIS WO CONTRAST  Result Date: 07/04/2020 CLINICAL DATA:  Right-sided abdominal pain. EXAM: CT ABDOMEN AND PELVIS WITHOUT CONTRAST TECHNIQUE: Multidetector CT imaging of the abdomen and pelvis was performed following the standard protocol without IV contrast. COMPARISON:  March 31, 2020. FINDINGS: Lower chest: No acute abnormality. Hepatobiliary: No focal liver abnormality is seen. Status post cholecystectomy. No biliary dilatation. Pancreas: Unremarkable. No pancreatic ductal dilatation or surrounding inflammatory changes. Spleen: Normal in size without focal abnormality. Adrenals/Urinary Tract: Adrenal glands appear normal. Right renal cyst is noted. No hydronephrosis or renal obstruction is noted. No renal or ureteral calculi are noted. Urinary bladder is unremarkable. Stomach/Bowel: The stomach appears normal. There is no evidence of bowel obstruction. The appendix is enlarged with surrounding inflammation consistent with acute appendicitis. No definite abscess is  noted. Appendix: Location: Right lower quadrant. Diameter: 10 mm. Appendicolith: No. Mucosal hyper-enhancement: Unenhanced scan. Extraluminal gas: No. Periappendiceal collection: No. Vascular/Lymphatic: Aortic atherosclerosis. No enlarged abdominal or pelvic lymph nodes. Reproductive: Uterus and bilateral adnexa are unremarkable. Other: No abdominal wall hernia or abnormality. No abdominopelvic ascites. Musculoskeletal: No acute or significant osseous findings. IMPRESSION: Findings consistent with acute appendicitis. No definite abscess is noted. Electronically Signed   By: Marijo Conception M.D.   On: 07/04/2020 21:25    EKG: I independently viewed the EKG done and my findings are as followed: EKG was not done in the ED  Assessment/Plan Present on Admission: . Acute appendicitis . Abdominal pain . Hyperlipidemia . HTN (hypertension) . GERD (gastroesophageal reflux disease)  Principal Problem:   Acute appendicitis Active Problems:   Hyperlipidemia   HTN (hypertension)   GERD (gastroesophageal reflux disease)   Abdominal pain   Vomiting   Hyperglycemia due to diabetes mellitus (HCC)  Abdominal pain, nausea and vomiting possibly due to acute appendicitis Patient will be admitted to [facility] Continue IV NS at 75 mLs/Hr Continue IV Zofran p.r.n. Continue n.p.o. at midnight in anticipation for possible surgical procedure in the morning General surgery was consulted by ED physician and will follow up with patient in the morning  Hyperglycemia secondary to T2DM Continue insulin sliding scale and hypoglycemia protocol  Essential hypertension Continue home meds when patient is able to resume oral intake  Hyperlipidemia Continue home meds when patient is able to resume oral intake  GERD Continue Protonix   DVT prophylaxis: SCDs  Code Status: Full code  Family Communication: Daughter at bedside (all questions answered to satisfaction)  Disposition Plan:  Patient is from:                         home Anticipated DC to:                   SNF or family members home Anticipated DC date:               2-3 days Anticipated DC barriers:         patient requires inpatient management due to acute appendicitis with pending surgical consult and possible surgical intervention    Consults called: General surgery  Admission status: Inpatient    Bernadette Hoit MD [facility]  07/05/2020, 2:15 AM

## 2020-07-04 NOTE — ED Triage Notes (Signed)
Pt c/o right side abdominal pain with n/v/d

## 2020-07-05 ENCOUNTER — Encounter (HOSPITAL_COMMUNITY)
Admission: EM | Disposition: A | Discharge status: Home / Self Care | Payer: Self-pay | Source: Home / Self Care | Attending: General Surgery

## 2020-07-05 ENCOUNTER — Inpatient Hospital Stay (HOSPITAL_COMMUNITY): Payer: Self-pay | Admitting: Anesthesiology

## 2020-07-05 ENCOUNTER — Encounter (HOSPITAL_COMMUNITY): Payer: Self-pay | Admitting: Internal Medicine

## 2020-07-05 DIAGNOSIS — E1165 Type 2 diabetes mellitus with hyperglycemia: Secondary | ICD-10-CM

## 2020-07-05 DIAGNOSIS — K353 Acute appendicitis with localized peritonitis, without perforation or gangrene: Secondary | ICD-10-CM

## 2020-07-05 DIAGNOSIS — R1110 Vomiting, unspecified: Secondary | ICD-10-CM

## 2020-07-05 HISTORY — PX: LAPAROSCOPIC APPENDECTOMY: SHX408

## 2020-07-05 LAB — CBC
HCT: 40.2 % (ref 36.0–46.0)
Hemoglobin: 12.6 g/dL (ref 12.0–15.0)
MCH: 29.1 pg (ref 26.0–34.0)
MCHC: 31.3 g/dL (ref 30.0–36.0)
MCV: 92.8 fL (ref 80.0–100.0)
Platelets: 227 10*3/uL (ref 150–400)
RBC: 4.33 MIL/uL (ref 3.87–5.11)
RDW: 13.9 % (ref 11.5–15.5)
WBC: 9.9 10*3/uL (ref 4.0–10.5)
nRBC: 0 % (ref 0.0–0.2)

## 2020-07-05 LAB — COMPREHENSIVE METABOLIC PANEL
ALT: 17 U/L (ref 0–44)
AST: 16 U/L (ref 15–41)
Albumin: 3.9 g/dL (ref 3.5–5.0)
Alkaline Phosphatase: 62 U/L (ref 38–126)
Anion gap: 9 (ref 5–15)
BUN: 14 mg/dL (ref 6–20)
CO2: 27 mmol/L (ref 22–32)
Calcium: 8.9 mg/dL (ref 8.9–10.3)
Chloride: 101 mmol/L (ref 98–111)
Creatinine, Ser: 0.83 mg/dL (ref 0.44–1.00)
GFR, Estimated: >60 mL/min (ref >60)
Glucose, Bld: 139 mg/dL — ABNORMAL HIGH (ref 70–99)
Potassium: 4.2 mmol/L (ref 3.5–5.1)
Sodium: 137 mmol/L (ref 135–145)
Total Bilirubin: 1.1 mg/dL (ref 0.3–1.2)
Total Protein: 7.9 g/dL (ref 6.5–8.1)

## 2020-07-05 LAB — PROTIME-INR
INR: 1.1 (ref 0.8–1.2)
Prothrombin Time: 13.7 seconds (ref 11.4–15.2)

## 2020-07-05 LAB — GLUCOSE, CAPILLARY
Glucose-Capillary: 153 mg/dL — ABNORMAL HIGH (ref 70–99)
Glucose-Capillary: 119 mg/dL — ABNORMAL HIGH (ref 70–99)
Glucose-Capillary: 113 mg/dL — ABNORMAL HIGH (ref 70–99)
Glucose-Capillary: 136 mg/dL — ABNORMAL HIGH (ref 70–99)
Glucose-Capillary: 238 mg/dL — ABNORMAL HIGH (ref 70–99)
Glucose-Capillary: 292 mg/dL — ABNORMAL HIGH (ref 70–99)

## 2020-07-05 LAB — RESP PANEL BY RT-PCR (FLU A&B, COVID) ARPGX2
Influenza A by PCR: NEGATIVE
Influenza B by PCR: NEGATIVE
SARS Coronavirus 2 by RT PCR: NEGATIVE

## 2020-07-05 LAB — MAGNESIUM: Magnesium: 1.7 mg/dL (ref 1.7–2.4)

## 2020-07-05 LAB — MRSA PCR SCREENING: MRSA by PCR: NEGATIVE

## 2020-07-05 LAB — PHOSPHORUS: Phosphorus: 4 mg/dL (ref 2.5–4.6)

## 2020-07-05 LAB — HEMOGLOBIN A1C
Hgb A1c MFr Bld: 8.6 % — ABNORMAL HIGH (ref 4.8–5.6)
Mean Plasma Glucose: 200.12 mg/dL

## 2020-07-05 LAB — APTT: aPTT: 23 seconds — ABNORMAL LOW (ref 24–36)

## 2020-07-05 SURGERY — APPENDECTOMY, LAPAROSCOPIC
Anesthesia: General | Site: Abdomen

## 2020-07-05 MED ORDER — METFORMIN HCL 500 MG PO TABS
1000.0000 mg | ORAL_TABLET | Freq: Two times a day (BID) | ORAL | Status: DC
  Administered 2020-07-05 – 2020-07-06 (×2): 1000 mg via ORAL
  Filled 2020-07-05 (×2): qty 2

## 2020-07-05 MED ORDER — ONDANSETRON HCL 4 MG/2ML IJ SOLN
INTRAMUSCULAR | Status: DC | PRN
  Administered 2020-07-05: 4 mg via INTRAVENOUS

## 2020-07-05 MED ORDER — SCOPOLAMINE 1 MG/3DAYS TD PT72
MEDICATED_PATCH | TRANSDERMAL | Status: AC
  Filled 2020-07-05: qty 1

## 2020-07-05 MED ORDER — CHLORHEXIDINE GLUCONATE CLOTH 2 % EX PADS
6.0000 | MEDICATED_PAD | Freq: Once | CUTANEOUS | Status: AC
Start: 2020-07-05 — End: 2020-07-05
  Administered 2020-07-05: 6 via TOPICAL

## 2020-07-05 MED ORDER — DEXAMETHASONE SODIUM PHOSPHATE 4 MG/ML IJ SOLN
INTRAMUSCULAR | Status: DC | PRN
  Administered 2020-07-05: 8 mg via INTRAVENOUS

## 2020-07-05 MED ORDER — INSULIN ASPART 100 UNIT/ML IJ SOLN
0.0000 [IU] | INTRAMUSCULAR | Status: DC
  Administered 2020-07-05: 2 [IU] via SUBCUTANEOUS

## 2020-07-05 MED ORDER — BUPIVACAINE LIPOSOME 1.3 % IJ SUSP
INTRAMUSCULAR | Status: DC | PRN
  Administered 2020-07-05: 20 mL

## 2020-07-05 MED ORDER — KETOROLAC TROMETHAMINE 30 MG/ML IJ SOLN
INTRAMUSCULAR | Status: AC
  Filled 2020-07-05: qty 1

## 2020-07-05 MED ORDER — CHLORHEXIDINE GLUCONATE 0.12 % MT SOLN: 15.0000 mL | Freq: Once | OROMUCOSAL | Status: DC

## 2020-07-05 MED ORDER — DIPHENHYDRAMINE HCL 25 MG PO CAPS: 25.0000 mg | ORAL_CAPSULE | Freq: Four times a day (QID) | ORAL | Status: DC | PRN

## 2020-07-05 MED ORDER — FENTANYL CITRATE (PF) 100 MCG/2ML IJ SOLN: 25.0000 ug | INTRAMUSCULAR | Status: DC | PRN

## 2020-07-05 MED ORDER — ONDANSETRON HCL 4 MG/2ML IJ SOLN
4.0000 mg | Freq: Four times a day (QID) | INTRAMUSCULAR | Status: DC | PRN
  Administered 2020-07-05: 4 mg via INTRAVENOUS
  Filled 2020-07-05: qty 2

## 2020-07-05 MED ORDER — LOSARTAN POTASSIUM 50 MG PO TABS
100.0000 mg | ORAL_TABLET | Freq: Every day | ORAL | Status: DC
  Administered 2020-07-05 – 2020-07-06 (×2): 100 mg via ORAL
  Filled 2020-07-05 (×2): qty 2

## 2020-07-05 MED ORDER — METHOCARBAMOL 500 MG PO TABS: 500.0000 mg | ORAL_TABLET | Freq: Three times a day (TID) | ORAL | Status: DC | PRN

## 2020-07-05 MED ORDER — DIPHENHYDRAMINE HCL 50 MG/ML IJ SOLN: 25.0000 mg | Freq: Four times a day (QID) | INTRAMUSCULAR | Status: DC | PRN

## 2020-07-05 MED ORDER — INSULIN ASPART 100 UNIT/ML IJ SOLN
0.0000 [IU] | Freq: Every day | INTRAMUSCULAR | Status: DC
  Administered 2020-07-05: 3 [IU] via SUBCUTANEOUS

## 2020-07-05 MED ORDER — LACTATED RINGERS IV SOLN: INTRAVENOUS | Status: DC

## 2020-07-05 MED ORDER — ACETAMINOPHEN 325 MG PO TABS: 650.0000 mg | ORAL_TABLET | Freq: Four times a day (QID) | ORAL | Status: DC | PRN

## 2020-07-05 MED ORDER — LORAZEPAM 2 MG/ML IJ SOLN: 1.0000 mg | INTRAMUSCULAR | Status: DC | PRN

## 2020-07-05 MED ORDER — KETOROLAC TROMETHAMINE 30 MG/ML IJ SOLN
30.0000 mg | Freq: Four times a day (QID) | INTRAMUSCULAR | Status: DC | PRN
  Administered 2020-07-05 – 2020-07-06 (×3): 30 mg via INTRAVENOUS
  Filled 2020-07-05 (×3): qty 1

## 2020-07-05 MED ORDER — ACETAMINOPHEN 650 MG RE SUPP: 650.0000 mg | Freq: Four times a day (QID) | RECTAL | Status: DC | PRN

## 2020-07-05 MED ORDER — SCOPOLAMINE 1 MG/3DAYS TD PT72
1.0000 | MEDICATED_PATCH | TRANSDERMAL | Status: DC
  Administered 2020-07-05: 1.5 mg via TRANSDERMAL

## 2020-07-05 MED ORDER — FENTANYL CITRATE (PF) 100 MCG/2ML IJ SOLN
INTRAMUSCULAR | Status: DC | PRN
  Administered 2020-07-05 (×2): 50 ug via INTRAVENOUS

## 2020-07-05 MED ORDER — SODIUM CHLORIDE 0.9 % IV SOLN
2.0000 g | Freq: Once | INTRAVENOUS | Status: AC
  Administered 2020-07-05: 2 g via INTRAVENOUS
  Filled 2020-07-05: qty 20

## 2020-07-05 MED ORDER — FENTANYL CITRATE (PF) 100 MCG/2ML IJ SOLN
INTRAMUSCULAR | Status: AC
  Filled 2020-07-05: qty 2

## 2020-07-05 MED ORDER — ONDANSETRON HCL 4 MG/2ML IJ SOLN: 4.0000 mg | Freq: Once | INTRAMUSCULAR | Status: DC | PRN

## 2020-07-05 MED ORDER — SODIUM CHLORIDE 0.9 % IR SOLN
Status: DC | PRN
  Administered 2020-07-05: 1000 mL
  Administered 2020-07-05: 3000 mL

## 2020-07-05 MED ORDER — KETOROLAC TROMETHAMINE 30 MG/ML IJ SOLN
30.0000 mg | Freq: Four times a day (QID) | INTRAMUSCULAR | Status: AC
  Administered 2020-07-05: 30 mg via INTRAVENOUS
  Filled 2020-07-05: qty 1

## 2020-07-05 MED ORDER — ONDANSETRON HCL 4 MG/2ML IJ SOLN: 4.0000 mg | Freq: Four times a day (QID) | INTRAMUSCULAR | Status: DC | PRN

## 2020-07-05 MED ORDER — SODIUM CHLORIDE 0.9 % IV SOLN: Freq: Once | INTRAVENOUS | Status: AC

## 2020-07-05 MED ORDER — ENOXAPARIN SODIUM 40 MG/0.4ML IJ SOSY
40.0000 mg | PREFILLED_SYRINGE | INTRAMUSCULAR | Status: DC
  Administered 2020-07-06: 40 mg via SUBCUTANEOUS
  Filled 2020-07-05: qty 0.4

## 2020-07-05 MED ORDER — METRONIDAZOLE 500 MG/100ML IV SOLN
500.0000 mg | Freq: Three times a day (TID) | INTRAVENOUS | Status: DC
  Administered 2020-07-05 – 2020-07-06 (×3): 500 mg via INTRAVENOUS
  Filled 2020-07-05 (×3): qty 100

## 2020-07-05 MED ORDER — ROCURONIUM 10MG/ML (10ML) SYRINGE FOR MEDFUSION PUMP - OPTIME
INTRAVENOUS | Status: DC | PRN
  Administered 2020-07-05: 30 mg via INTRAVENOUS

## 2020-07-05 MED ORDER — MIDAZOLAM HCL 2 MG/2ML IJ SOLN
INTRAMUSCULAR | Status: AC
  Filled 2020-07-05: qty 2

## 2020-07-05 MED ORDER — KETOROLAC TROMETHAMINE 30 MG/ML IJ SOLN: 30.0000 mg | Freq: Once | INTRAMUSCULAR | Status: DC

## 2020-07-05 MED ORDER — SODIUM CHLORIDE 0.9 % IV SOLN: INTRAVENOUS | Status: DC | PRN

## 2020-07-05 MED ORDER — INSULIN ASPART 100 UNIT/ML IJ SOLN
0.0000 [IU] | Freq: Three times a day (TID) | INTRAMUSCULAR | Status: DC
  Administered 2020-07-05: 5 [IU] via SUBCUTANEOUS
  Administered 2020-07-05: 2 [IU] via SUBCUTANEOUS

## 2020-07-05 MED ORDER — SUGAMMADEX SODIUM 200 MG/2ML IV SOLN
INTRAVENOUS | Status: DC | PRN
  Administered 2020-07-05: 200 mg via INTRAVENOUS

## 2020-07-05 MED ORDER — BUPIVACAINE LIPOSOME 1.3 % IJ SUSP
INTRAMUSCULAR | Status: AC
  Filled 2020-07-05: qty 20

## 2020-07-05 MED ORDER — SIMETHICONE 80 MG PO CHEW
40.0000 mg | CHEWABLE_TABLET | Freq: Four times a day (QID) | ORAL | Status: DC | PRN
  Administered 2020-07-06: 40 mg via ORAL
  Filled 2020-07-05: qty 1

## 2020-07-05 MED ORDER — METRONIDAZOLE 500 MG/100ML IV SOLN
500.0000 mg | Freq: Once | INTRAVENOUS | Status: AC
  Administered 2020-07-05: 500 mg via INTRAVENOUS
  Filled 2020-07-05: qty 100

## 2020-07-05 MED ORDER — ROSUVASTATIN CALCIUM 20 MG PO TABS
20.0000 mg | ORAL_TABLET | Freq: Every day | ORAL | Status: DC
  Administered 2020-07-05: 20 mg via ORAL
  Filled 2020-07-05 (×2): qty 1

## 2020-07-05 MED ORDER — INSULIN ASPART 100 UNIT/ML IJ SOLN
0.0000 [IU] | Freq: Three times a day (TID) | INTRAMUSCULAR | Status: DC
  Administered 2020-07-06: 3 [IU] via SUBCUTANEOUS
  Administered 2020-07-06: 5 [IU] via SUBCUTANEOUS

## 2020-07-05 MED ORDER — ORAL CARE MOUTH RINSE: 15.0000 mL | Freq: Once | OROMUCOSAL | Status: DC

## 2020-07-05 MED ORDER — ONDANSETRON 4 MG PO TBDP: 4.0000 mg | ORAL_TABLET | Freq: Four times a day (QID) | ORAL | Status: DC | PRN

## 2020-07-05 MED ORDER — DEXAMETHASONE SODIUM PHOSPHATE 10 MG/ML IJ SOLN
INTRAMUSCULAR | Status: AC
  Filled 2020-07-05: qty 1

## 2020-07-05 MED ORDER — MIDAZOLAM HCL 5 MG/5ML IJ SOLN
INTRAMUSCULAR | Status: DC | PRN
  Administered 2020-07-05: 2 mg via INTRAVENOUS

## 2020-07-05 MED ORDER — OXYCODONE HCL 5 MG PO TABS
5.0000 mg | ORAL_TABLET | ORAL | Status: DC | PRN
  Administered 2020-07-05: 10 mg via ORAL
  Filled 2020-07-05: qty 2

## 2020-07-05 MED ORDER — PROPOFOL 10 MG/ML IV BOLUS
INTRAVENOUS | Status: AC
  Filled 2020-07-05: qty 20

## 2020-07-05 MED ORDER — ONDANSETRON HCL 4 MG/2ML IJ SOLN
INTRAMUSCULAR | Status: AC
  Filled 2020-07-05: qty 2

## 2020-07-05 MED ORDER — PANTOPRAZOLE SODIUM 40 MG IV SOLR
40.0000 mg | INTRAVENOUS | Status: DC
  Administered 2020-07-05: 40 mg via INTRAVENOUS
  Filled 2020-07-05: qty 40

## 2020-07-05 MED ORDER — CHLORHEXIDINE GLUCONATE CLOTH 2 % EX PADS
6.0000 | MEDICATED_PAD | Freq: Once | CUTANEOUS | Status: AC
  Administered 2020-07-05: 6 via TOPICAL

## 2020-07-05 MED ORDER — SODIUM CHLORIDE 0.9 % IV SOLN: INTRAVENOUS | Status: DC

## 2020-07-05 MED ORDER — FENTANYL CITRATE (PF) 100 MCG/2ML IJ SOLN
50.0000 ug | INTRAMUSCULAR | Status: DC | PRN
  Administered 2020-07-06: 50 ug via INTRAVENOUS
  Filled 2020-07-05: qty 2

## 2020-07-05 MED ORDER — PROPOFOL 10 MG/ML IV BOLUS
INTRAVENOUS | Status: DC | PRN
  Administered 2020-07-05: 100 mg via INTRAVENOUS

## 2020-07-05 MED ORDER — SODIUM CHLORIDE 0.9 % IV SOLN
2.0000 g | INTRAVENOUS | Status: DC
  Administered 2020-07-06: 2 g via INTRAVENOUS
  Filled 2020-07-05: qty 20

## 2020-07-05 SURGICAL SUPPLY — 47 items
ADH SKN CLS APL DERMABOND .7 (GAUZE/BANDAGES/DRESSINGS) ×1
APL PRP STRL LF DISP 70% ISPRP (MISCELLANEOUS) ×1
BAG RETRIEVAL 10 (BASKET) ×1
CHLORAPREP W/TINT 26 (MISCELLANEOUS) ×2 IMPLANT
CLOTH BEACON ORANGE TIMEOUT ST (SAFETY) ×2 IMPLANT
COVER LIGHT HANDLE STERIS (MISCELLANEOUS) ×4 IMPLANT
COVER WAND RF STERILE (DRAPES) ×2 IMPLANT
CUTTER FLEX LINEAR 45M (STAPLE) ×2 IMPLANT
DERMABOND ADVANCED (GAUZE/BANDAGES/DRESSINGS) ×1
DERMABOND ADVANCED .7 DNX12 (GAUZE/BANDAGES/DRESSINGS) ×1 IMPLANT
ELECT REM PT RETURN 9FT ADLT (ELECTROSURGICAL) ×2
ELECTRODE REM PT RTRN 9FT ADLT (ELECTROSURGICAL) ×1 IMPLANT
GLOVE SURG SS PI 7.5 STRL IVOR (GLOVE) ×2 IMPLANT
GLOVE SURG UNDER POLY LF SZ7 (GLOVE) ×6 IMPLANT
GOWN STRL REUS W/TWL LRG LVL3 (GOWN DISPOSABLE) ×4 IMPLANT
INST SET LAPROSCOPIC AP (KITS) ×2 IMPLANT
IV NS IRRIG 3000ML ARTHROMATIC (IV SOLUTION) ×1 IMPLANT
KIT TURNOVER KIT A (KITS) ×2 IMPLANT
MANIFOLD NEPTUNE II (INSTRUMENTS) ×2 IMPLANT
NDL HYPO 18GX1.5 BLUNT FILL (NEEDLE) ×1 IMPLANT
NDL HYPO 21X1.5 SAFETY (NEEDLE) ×1 IMPLANT
NDL INSUFFLATION 14GA 120MM (NEEDLE) ×1 IMPLANT
NEEDLE HYPO 18GX1.5 BLUNT FILL (NEEDLE) ×2 IMPLANT
NEEDLE HYPO 21X1.5 SAFETY (NEEDLE) ×2 IMPLANT
NEEDLE INSUFFLATION 14GA 120MM (NEEDLE) ×2 IMPLANT
NS IRRIG 1000ML POUR BTL (IV SOLUTION) ×2 IMPLANT
PACK LAP CHOLE LZT030E (CUSTOM PROCEDURE TRAY) ×2 IMPLANT
PAD ARMBOARD 7.5X6 YLW CONV (MISCELLANEOUS) ×2 IMPLANT
PENCIL SMOKE EVACUATOR COATED (MISCELLANEOUS) ×2 IMPLANT
RELOAD STAPLE 45 3.5 BLU ETS (ENDOMECHANICALS) IMPLANT
RELOAD STAPLE TA45 3.5 REG BLU (ENDOMECHANICALS) ×2 IMPLANT
SET BASIN LINEN APH (SET/KITS/TRAYS/PACK) ×2 IMPLANT
SET TUBE IRRIG SUCTION NO TIP (IRRIGATION / IRRIGATOR) ×1 IMPLANT
SET TUBE SMOKE EVAC HIGH FLOW (TUBING) ×2 IMPLANT
SHEARS HARMONIC ACE PLUS 36CM (ENDOMECHANICALS) ×2 IMPLANT
SUT MNCRL AB 4-0 PS2 18 (SUTURE) ×4 IMPLANT
SUT VICRYL 0 UR6 27IN ABS (SUTURE) ×2 IMPLANT
SYR 20ML LL LF (SYRINGE) ×4 IMPLANT
SYS BAG RETRIEVAL 10MM (BASKET) ×1
SYSTEM BAG RETRIEVAL 10MM (BASKET) ×1 IMPLANT
TRAY FOLEY W/BAG SLVR 16FR (SET/KITS/TRAYS/PACK) ×2
TRAY FOLEY W/BAG SLVR 16FR ST (SET/KITS/TRAYS/PACK) ×1 IMPLANT
TROCAR ENDO BLADELESS 11MM (ENDOMECHANICALS) ×2 IMPLANT
TROCAR ENDO BLADELESS 12MM (ENDOMECHANICALS) ×2 IMPLANT
TROCAR XCEL NON-BLD 5MMX100MML (ENDOMECHANICALS) ×2 IMPLANT
TUBE CONNECTING 12X1/4 (SUCTIONS) ×2 IMPLANT
WARMER LAPAROSCOPE (MISCELLANEOUS) ×2 IMPLANT

## 2020-07-05 NOTE — Anesthesia Procedure Notes (Signed)
Procedure Name: Intubation Date/Time: 07/05/2020 9:10 AM Performed by: Louann Sjogren, MD Pre-anesthesia Checklist: Patient identified, Emergency Drugs available, Suction available, Patient being monitored and Timeout performed Patient Re-evaluated:Patient Re-evaluated prior to induction Oxygen Delivery Method: Circle system utilized Preoxygenation: Pre-oxygenation with 100% oxygen Induction Type: IV induction Laryngoscope Size: Glidescope and 3 Grade View: Grade I Tube type: Oral Tube size: 7.0 mm Number of attempts: 1 Airway Equipment and Method: Video-laryngoscopy and Stylet Placement Confirmation: ETT inserted through vocal cords under direct vision,  positive ETCO2 and breath sounds checked- equal and bilateral Secured at: 22 cm Tube secured with: Tape Dental Injury: Teeth and Oropharynx as per pre-operative assessment

## 2020-07-05 NOTE — Progress Notes (Signed)
Patient Demographics:    Amanda Zamora, is a 60 y.o. female, DOB - 10-Sep-1960, CWC:[id number]  Admit date - 07/04/2020   Admitting Physician Bernadette Hoit, DO  Outpatient Primary MD for the patient is Soyla Dryer, PA-C  LOS - 1   Chief Complaint  Patient presents with  . Abdominal Pain    Right side        Subjective:    Amanda Zamora today has no fevers, no emesis,  No chest pain,  Post op abd pain  Assessment  & Plan :    Principal Problem:   Acute appendicitis Active Problems:   Hyperlipidemia   HTN (hypertension)   GERD (gastroesophageal reflux disease)   Abdominal pain   Vomiting   Hyperglycemia due to diabetes mellitus (HCC)    1)DM2-A1c 8.6 reflecting uncontrolled DM with hyperglycemia PTA -Hold metformin and 70/30 insulin Use Novolog/Humalog Sliding scale insulin with Accu-Cheks/Fingersticks as ordered   2)HTN-continue losartan   3)HLD--- restart Crestor Continue home meds when patient is able to resume oral intake  4)GERD Continue Protonix  5)status post appendectomy on 07/05/2020 further management per general surgeon   Disposition/Need for in-Hospital Stay- patient unable to be discharged at this time due to --difficulties with postop control requiring IV pain medications and IV antiemetics*  Status is: Inpatient  Remains inpatient appropriate because:Please see disposition above  Disposition: The patient is from: Home              Anticipated d/c is to: Home              Anticipated d/c date is: 1 day              Patient currently is not medically stable to d/c. Barriers: Not Clinically Stable-   Code Status : -  Code Status: Full Code   Family Communication:   NA (patient is alert, awake and coherent)   Consults  :  Gen surgery  DVT Prophylaxis  :   - SCDs  enoxaparin (LOVENOX) injection 40 mg Start: 07/06/20 0800 SCD's Start: 07/05/20  1048  Lab Results  Component Value Date   PLT 227 07/05/2020    Inpatient Medications  Scheduled Meds: . [START ON 07/06/2020] enoxaparin (LOVENOX) injection  40 mg Subcutaneous Q24H  . insulin aspart  0-15 Units Subcutaneous TID WC  . losartan  100 mg Oral Daily  . metFORMIN  1,000 mg Oral BID WC  . rosuvastatin  20 mg Oral q1800   Continuous Infusions: . sodium chloride 100 mL/hr at 07/05/20 1057  . [START ON 07/06/2020] cefTRIAXone (ROCEPHIN)  IV     And  . metronidazole 500 mg (07/05/20 1649)   PRN Meds:.acetaminophen **OR** acetaminophen, diphenhydrAMINE **OR** diphenhydrAMINE, fentaNYL (SUBLIMAZE) injection, [COMPLETED] ketorolac **FOLLOWED BY** ketorolac, LORazepam, methocarbamol, ondansetron **OR** ondansetron (ZOFRAN) IV, oxyCODONE, simethicone    Anti-infectives (From admission, onward)   Start     Dose/Rate Route Frequency Ordered Stop   07/06/20 0800  cefTRIAXone (ROCEPHIN) 2 g in sodium chloride 0.9 % 100 mL IVPB       "And" Linked Group Details   2 g 200 mL/hr over 30 Minutes Intravenous Every 24 hours 07/05/20 1047     07/05/20 1600  metroNIDAZOLE (FLAGYL) IVPB 500 mg       "  And" Linked Group Details   500 mg 100 mL/hr over 60 Minutes Intravenous Every 8 hours 07/05/20 1047     07/05/20 0830  cefTRIAXone (ROCEPHIN) 2 g in sodium chloride 0.9 % 100 mL IVPB        2 g 200 mL/hr over 30 Minutes Intravenous  Once 07/05/20 0740 07/05/20 0856   07/05/20 0830  metroNIDAZOLE (FLAGYL) IVPB 500 mg        500 mg 100 mL/hr over 60 Minutes Intravenous  Once 07/05/20 0740 07/05/20 0923        Objective:   Vitals:   07/05/20 1042 07/05/20 1048 07/05/20 1315 07/05/20 1751  BP: 130/68 123/63 (!) 106/53 (!) 104/54  Pulse: 78 83 67 70  Resp: 18 20 18 18   Temp:  98.3 F (36.8 C) 98 F (36.7 C) 98.3 F (36.8 C)  TempSrc:  Oral Oral Oral  SpO2: 98% 95% 95% 96%  Weight:      Height:        Wt Readings from Last 3 Encounters:  07/05/20 76.6 kg  06/11/20 71.7 kg   06/11/20 78 kg     Intake/Output Summary (Last 24 hours) at 07/05/2020 1833 Last data filed at 07/05/2020 0867 Gross per 24 hour  Intake 400 ml  Output 2 ml  Net 398 ml   Physical Exam  Gen:- Awake Alert,  In no apparent distress  HEENT:- [place].AT, No sclera icterus Neck-Supple Neck,No JVD,.  Lungs-  CTAB , fair symmetrical air movement CV- S1, S2 normal, regular  Abd-  +ve B.Sounds, Abd Soft, appropriate postop tenderness,    Extremity/Skin:- No  edema, pedal pulses present  Psych-affect is appropriate, oriented x3 Neuro-no new focal deficits, no tremors   Data Review:   Micro Results Recent Results (from the past 240 hour(s))  Resp Panel by RT-PCR (Flu A&B, Covid) Nasopharyngeal Swab     Status: None   Collection Time: 07/04/20 10:43 PM   Specimen: Nasopharyngeal Swab; Nasopharyngeal(NP) swabs in vial transport medium  Result Value Ref Range Status   SARS Coronavirus 2 by RT PCR NEGATIVE NEGATIVE Final    Comment: (NOTE) SARS-CoV-2 target nucleic acids are NOT DETECTED.  The SARS-CoV-2 RNA is generally detectable in upper respiratory specimens during the acute phase of infection. The lowest concentration of SARS-CoV-2 viral copies this assay can detect is 138 copies/mL. A negative result does not preclude SARS-Cov-2 infection and should not be used as the sole basis for treatment or other patient management decisions. A negative result may occur with  improper specimen collection/handling, submission of specimen other than nasopharyngeal swab, presence of viral mutation(s) within the areas targeted by this assay, and inadequate number of viral copies(<138 copies/mL). A negative result must be combined with clinical observations, patient history, and epidemiological information. The expected result is Negative.  Fact Sheet for Patients:  EntrepreneurPulse.com.au  Fact Sheet for Healthcare Providers:  IncredibleEmployment.be  This  test is no t yet approved or cleared by the [place] FDA and  has been authorized for detection and/or diagnosis of SARS-CoV-2 by FDA under an Emergency Use Authorization (EUA). This EUA will remain  in effect (meaning this test can be used) for the duration of the COVID-19 declaration under Section 564(b)(1) of the Act, 21 U.S.C.section 360bbb-3(b)(1), unless the authorization is terminated  or revoked sooner.       Influenza A by PCR NEGATIVE NEGATIVE Final   Influenza B by PCR NEGATIVE NEGATIVE Final    Comment: (NOTE) The Xpert Xpress SARS-CoV-2/FLU/RSV  plus assay is intended as an aid in the diagnosis of influenza from Nasopharyngeal swab specimens and should not be used as a sole basis for treatment. Nasal washings and aspirates are unacceptable for Xpert Xpress SARS-CoV-2/FLU/RSV testing.  Fact Sheet for Patients: EntrepreneurPulse.com.au  Fact Sheet for Healthcare Providers: IncredibleEmployment.be  This test is not yet approved or cleared by the [place] FDA and has been authorized for detection and/or diagnosis of SARS-CoV-2 by FDA under an Emergency Use Authorization (EUA). This EUA will remain in effect (meaning this test can be used) for the duration of the COVID-19 declaration under Section 564(b)(1) of the Act, 21 U.S.C. section 360bbb-3(b)(1), unless the authorization is terminated or revoked.  Performed at [facility], [address], [place], [place] [address]   MRSA PCR Screening     Status: None   Collection Time: 07/05/20 12:20 AM   Specimen: Nasal Mucosa; Nasopharyngeal  Result Value Ref Range Status   MRSA by PCR NEGATIVE NEGATIVE Final    Comment:        The GeneXpert MRSA Assay (FDA approved for NASAL specimens only), is one component of a comprehensive MRSA colonization surveillance program. It is not intended to diagnose MRSA infection nor to guide or monitor treatment for MRSA  infections. Performed at [facility], [address] [place], [place] [address]     Radiology Reports CT ABDOMEN PELVIS WO CONTRAST  Result Date: 07/04/2020 CLINICAL DATA:  Right-sided abdominal pain. EXAM: CT ABDOMEN AND PELVIS WITHOUT CONTRAST TECHNIQUE: Multidetector CT imaging of the abdomen and pelvis was performed following the standard protocol without IV contrast. COMPARISON:  March 31, 2020. FINDINGS: Lower chest: No acute abnormality. Hepatobiliary: No focal liver abnormality is seen. Status post cholecystectomy. No biliary dilatation. Pancreas: Unremarkable. No pancreatic ductal dilatation or surrounding inflammatory changes. Spleen: Normal in size without focal abnormality. Adrenals/Urinary Tract: Adrenal glands appear normal. Right renal cyst is noted. No hydronephrosis or renal obstruction is noted. No renal or ureteral calculi are noted. Urinary bladder is unremarkable. Stomach/Bowel: The stomach appears normal. There is no evidence of bowel obstruction. The appendix is enlarged with surrounding inflammation consistent with acute appendicitis. No definite abscess is noted. Appendix: Location: Right lower quadrant. Diameter: 10 mm. Appendicolith: No. Mucosal hyper-enhancement: Unenhanced scan. Extraluminal gas: No. Periappendiceal collection: No. Vascular/Lymphatic: Aortic atherosclerosis. No enlarged abdominal or pelvic lymph nodes. Reproductive: Uterus and bilateral adnexa are unremarkable. Other: No abdominal wall hernia or abnormality. No abdominopelvic ascites. Musculoskeletal: No acute or significant osseous findings. IMPRESSION: Findings consistent with acute appendicitis. No definite abscess is noted. Electronically Signed   By: Marijo Conception M.D.   On: 07/04/2020 21:25     CBC Recent Labs  Lab 07/04/20 1839 07/05/20 0555  WBC 8.7 9.9  HGB 12.6 12.6  HCT 38.5 40.2  PLT 283 227  MCV 90.8 92.8  MCH 29.7 29.1  MCHC 32.7 31.3  RDW 13.8 13.9  LYMPHSABS 1.4  --    MONOABS 0.3  --   EOSABS 0.3  --   BASOSABS 0.0  --     Chemistries  Recent Labs  Lab 07/04/20 1839 07/05/20 0555  NA 137 137  K 4.0 4.2  CL 102 101  CO2 26 27  GLUCOSE 151* 139*  BUN 15 14  CREATININE 0.98 0.83  CALCIUM 9.9 8.9  MG  --  1.7  AST 19 16  ALT 20 17  ALKPHOS 75 62  BILITOT 0.8 1.1   ------------------------------------------------------------------------------------------------------------------ No results for input(s): CHOL, HDL, LDLCALC,  TRIG, CHOLHDL, LDLDIRECT in the last 72 hours.  Lab Results  Component Value Date   HGBA1C 8.6 (H) 07/05/2020   ------------------------------------------------------------------------------------------------------------------ No results for input(s): TSH, T4TOTAL, T3FREE, THYROIDAB in the last 72 hours.  Invalid input(s): FREET3 ------------------------------------------------------------------------------------------------------------------ No results for input(s): VITAMINB12, FOLATE, FERRITIN, TIBC, IRON, RETICCTPCT in the last 72 hours.  Coagulation profile Recent Labs  Lab 07/05/20 0555  INR 1.1    No results for input(s): DDIMER in the last 72 hours.  Cardiac Enzymes No results for input(s): CKMB, TROPONINI, MYOGLOBIN in the last 168 hours.  Invalid input(s): CK ------------------------------------------------------------------------------------------------------------------ No results found for: BNP   Roxan Hockey M.D on 07/05/2020 at 6:33 PM  Go to [email or web address] - for contact info  [facility] - Office  [phone]

## 2020-07-05 NOTE — Anesthesia Preprocedure Evaluation (Signed)
Anesthesia Evaluation  Patient identified by MRN, date of birth, ID band Patient awake    Reviewed: Allergy & Precautions, H&P , NPO status , Patient's Chart, lab work & pertinent test results, reviewed documented beta blocker date and time   History of Anesthesia Complications (+) PONV and history of anesthetic complications  Airway Mallampati: II  TM Distance: >3 FB Neck ROM: full    Dental no notable dental hx. (+) Teeth Intact   Pulmonary neg pulmonary ROS,    Pulmonary exam normal breath sounds clear to auscultation       Cardiovascular Exercise Tolerance: Good negative cardio ROS   Rhythm:regular Rate:Normal     Neuro/Psych negative neurological ROS  negative psych ROS   GI/Hepatic Neg liver ROS, GERD  Medicated,  Endo/Other  negative endocrine ROSdiabetes, Type 2  Renal/GU negative Renal ROS  negative genitourinary   Musculoskeletal   Abdominal   Peds  Hematology negative hematology ROS (+)   Anesthesia Other Findings   Reproductive/Obstetrics negative OB ROS                             Anesthesia Physical Anesthesia Plan  ASA: II  Anesthesia Plan: General   Post-op Pain Management:    Induction:   PONV Risk Score and Plan: Ondansetron  Airway Management Planned:   Additional Equipment:   Intra-op Plan:   Post-operative Plan:   Informed Consent: I have reviewed the patients History and Physical, chart, labs and discussed the procedure including the risks, benefits and alternatives for the proposed anesthesia with the patient or authorized representative who has indicated his/her understanding and acceptance.     Dental Advisory Given  Plan Discussed with: CRNA  Anesthesia Plan Comments:         Anesthesia Quick Evaluation

## 2020-07-05 NOTE — Interval H&P Note (Signed)
History and Physical Interval Note:  07/05/2020 8:58 AM  Amanda Zamora  has presented today for surgery, with the diagnosis of acute appendicitis.  The various methods of treatment have been discussed with the patient and family. After consideration of risks, benefits and other options for treatment, the patient has consented to  Procedure(s): APPENDECTOMY LAPAROSCOPIC (N/A) as a surgical intervention.  The patient's history has been reviewed, patient examined, no change in status, stable for surgery.  I have reviewed the patient's chart and labs.  Questions were answered to the patient's satisfaction.     Aviva Signs

## 2020-07-05 NOTE — Progress Notes (Signed)
From PACU earlier.  3 lap sites dry and intact with dermabond.  Complained later of pain rated an 8 through interpreter Ipad.  Daughter has been her some today and able to help with communication, but patient understands a good amount of [place].   No c/o nausea.

## 2020-07-05 NOTE — Consult Note (Signed)
Reason for Consult: Acute appendicitis Referring Physician: Dr. Kathrynn Zamora is an 60 y.o. female.  HPI: Patient is a 60 year old [name] who presents with a 24-hour history of worsening right lower quadrant abdominal pain, nausea, and vomiting.  CT scan of the abdomen revealed acute appendicitis.  Patient is status post laparoscopic cholecystectomy in November 2021.  She has had intermittent chronic right upper quadrant abdominal pain.  No fever chills have been noted.  She was admitted by the hospitalist for IV hydration.  Past Medical History:  Diagnosis Date  . Detached retina   . GERD (gastroesophageal reflux disease)   . History of pyelonephritis   . PONV (postoperative nausea and vomiting)   . Type 2 diabetes mellitus ([place])     Past Surgical History:  Procedure Laterality Date  . CHOLECYSTECTOMY N/A 12/23/2019   Procedure: LAPAROSCOPIC CHOLECYSTECTOMY;  Surgeon: Virl Cagey, MD;  Location: [facility];  Service: General;  Laterality: N/A;  . EYE SURGERY Right    retina  . TUBAL LIGATION      Family History  Problem Relation Age of Onset  . Breast cancer Sister   . Diabetes Sister     Social History:  reports that she has never smoked. She has never used smokeless tobacco. She reports previous alcohol use. She reports that she does not use drugs.  Allergies: No Known Allergies  Medications: I have reviewed the patient's current medications.  Results for orders placed or performed during the hospital encounter of 07/04/20 (from the past 48 hour(s))  CBC with Differential     Status: None   Collection Time: 07/04/20  6:39 PM  Result Value Ref Range   WBC 8.7 4.0 - 10.5 K/uL   RBC 4.24 3.87 - 5.11 MIL/uL   Hemoglobin 12.6 12.0 - 15.0 g/dL   HCT 38.5 36.0 - 46.0 %   MCV 90.8 80.0 - 100.0 fL   MCH 29.7 26.0 - 34.0 pg   MCHC 32.7 30.0 - 36.0 g/dL   RDW 13.8 11.5 - 15.5 %   Platelets 283 150 - 400 K/uL   nRBC 0.0 0.0 - 0.2 %   Neutrophils  Relative % 77 %   Neutro Abs 6.6 1.7 - 7.7 K/uL   Lymphocytes Relative 16 %   Lymphs Abs 1.4 0.7 - 4.0 K/uL   Monocytes Relative 4 %   Monocytes Absolute 0.3 0.1 - 1.0 K/uL   Eosinophils Relative 3 %   Eosinophils Absolute 0.3 0.0 - 0.5 K/uL   Basophils Relative 0 %   Basophils Absolute 0.0 0.0 - 0.1 K/uL   Immature Granulocytes 0 %   Abs Immature Granulocytes 0.03 0.00 - 0.07 K/uL    Comment: Performed at [facility], [address] [place], [place] [address]  Comprehensive metabolic panel     Status: Abnormal   Collection Time: 07/04/20  6:39 PM  Result Value Ref Range   Sodium 137 135 - 145 mmol/L   Potassium 4.0 3.5 - 5.1 mmol/L   Chloride 102 98 - 111 mmol/L   CO2 26 22 - 32 mmol/L   Glucose, Bld 151 (H) 70 - 99 mg/dL    Comment: Glucose reference range applies only to samples taken after fasting for at least 8 hours.   BUN 15 6 - 20 mg/dL   Creatinine, Ser 0.98 0.44 - 1.00 mg/dL   Calcium 9.9 8.9 - 10.3 mg/dL   Total Protein 8.0 6.5 - 8.1 g/dL   Albumin 4.4 3.5 -  5.0 g/dL   AST 19 15 - 41 U/L   ALT 20 0 - 44 U/L   Alkaline Phosphatase 75 38 - 126 U/L   Total Bilirubin 0.8 0.3 - 1.2 mg/dL   GFR, Estimated >60 >60 mL/min    Comment: (NOTE) Calculated using the CKD-EPI Creatinine Equation (2021)    Anion gap 9 5 - 15    Comment: Performed at [facility], [address], [place], [place] [address]  Lipase, blood     Status: None   Collection Time: 07/04/20  6:39 PM  Result Value Ref Range   Lipase 23 11 - 51 U/L    Comment: Performed at [facility], [address], [place], [place] [address]  Resp Panel by RT-PCR (Flu A&B, Covid) Nasopharyngeal Swab     Status: None   Collection Time: 07/04/20 10:43 PM   Specimen: Nasopharyngeal Swab; Nasopharyngeal(NP) swabs in vial transport medium  Result Value Ref Range   SARS Coronavirus 2 by RT PCR NEGATIVE NEGATIVE    Comment: (NOTE) SARS-CoV-2 target nucleic acids are NOT DETECTED.  The SARS-CoV-2 RNA is generally  detectable in upper respiratory specimens during the acute phase of infection. The lowest concentration of SARS-CoV-2 viral copies this assay can detect is 138 copies/mL. A negative result does not preclude SARS-Cov-2 infection and should not be used as the sole basis for treatment or other patient management decisions. A negative result may occur with  improper specimen collection/handling, submission of specimen other than nasopharyngeal swab, presence of viral mutation(s) within the areas targeted by this assay, and inadequate number of viral copies(<138 copies/mL). A negative result must be combined with clinical observations, patient history, and epidemiological information. The expected result is Negative.  Fact Sheet for Patients:  EntrepreneurPulse.com.au  Fact Sheet for Healthcare Providers:  IncredibleEmployment.be  This test is no t yet approved or cleared by the [place] FDA and  has been authorized for detection and/or diagnosis of SARS-CoV-2 by FDA under an Emergency Use Authorization (EUA). This EUA will remain  in effect (meaning this test can be used) for the duration of the COVID-19 declaration under Section 564(b)(1) of the Act, 21 U.S.C.section 360bbb-3(b)(1), unless the authorization is terminated  or revoked sooner.       Influenza A by PCR NEGATIVE NEGATIVE   Influenza B by PCR NEGATIVE NEGATIVE    Comment: (NOTE) The Xpert Xpress SARS-CoV-2/FLU/RSV plus assay is intended as an aid in the diagnosis of influenza from Nasopharyngeal swab specimens and should not be used as a sole basis for treatment. Nasal washings and aspirates are unacceptable for Xpert Xpress SARS-CoV-2/FLU/RSV testing.  Fact Sheet for Patients: EntrepreneurPulse.com.au  Fact Sheet for Healthcare Providers: IncredibleEmployment.be  This test is not yet approved or cleared by the [place] FDA and has  been authorized for detection and/or diagnosis of SARS-CoV-2 by FDA under an Emergency Use Authorization (EUA). This EUA will remain in effect (meaning this test can be used) for the duration of the COVID-19 declaration under Section 564(b)(1) of the Act, 21 U.S.C. section 360bbb-3(b)(1), unless the authorization is terminated or revoked.  Performed at [facility], [address] [place], [place] [address]   MRSA PCR Screening     Status: None   Collection Time: 07/05/20 12:20 AM   Specimen: Nasal Mucosa; Nasopharyngeal  Result Value Ref Range   MRSA by PCR NEGATIVE NEGATIVE    Comment:        The GeneXpert MRSA Assay (FDA approved for NASAL specimens only),  is one component of a comprehensive MRSA colonization surveillance program. It is not intended to diagnose MRSA infection nor to guide or monitor treatment for MRSA infections. Performed at [facility], [address] [place], [place] [address]   Glucose, capillary     Status: Abnormal   Collection Time: 07/05/20  2:53 AM  Result Value Ref Range   Glucose-Capillary 153 (H) 70 - 99 mg/dL    Comment: Glucose reference range applies only to samples taken after fasting for at least 8 hours.  CBC     Status: None   Collection Time: 07/05/20  5:55 AM  Result Value Ref Range   WBC 9.9 4.0 - 10.5 K/uL   RBC 4.33 3.87 - 5.11 MIL/uL   Hemoglobin 12.6 12.0 - 15.0 g/dL   HCT 40.2 36.0 - 46.0 %   MCV 92.8 80.0 - 100.0 fL   MCH 29.1 26.0 - 34.0 pg   MCHC 31.3 30.0 - 36.0 g/dL   RDW 13.9 11.5 - 15.5 %   Platelets 227 150 - 400 K/uL   nRBC 0.0 0.0 - 0.2 %    Comment: Performed at [facility], [address], [place], [place] [address]  Protime-INR     Status: None   Collection Time: 07/05/20  5:55 AM  Result Value Ref Range   Prothrombin Time 13.7 11.4 - 15.2 seconds   INR 1.1 0.8 - 1.2    Comment: (NOTE) INR goal varies based on device and disease states. Performed at [facility], [address], [place], [place]  [address]   APTT     Status: Abnormal   Collection Time: 07/05/20  5:55 AM  Result Value Ref Range   aPTT 23 (L) 24 - 36 seconds    Comment: Performed at [facility], [address] , [place] [address]  Glucose, capillary     Status: Abnormal   Collection Time: 07/05/20  7:12 AM  Result Value Ref Range   Glucose-Capillary 119 (H) 70 - 99 mg/dL    Comment: Glucose reference range applies only to samples taken after fasting for at least 8 hours.    CT ABDOMEN PELVIS WO CONTRAST  Result Date: 07/04/2020 CLINICAL DATA:  Right-sided abdominal pain. EXAM: CT ABDOMEN AND PELVIS WITHOUT CONTRAST TECHNIQUE: Multidetector CT imaging of the abdomen and pelvis was performed following the standard protocol without IV contrast. COMPARISON:  March 31, 2020. FINDINGS: Lower chest: No acute abnormality. Hepatobiliary: No focal liver abnormality is seen. Status post cholecystectomy. No biliary dilatation. Pancreas: Unremarkable. No pancreatic ductal dilatation or surrounding inflammatory changes. Spleen: Normal in size without focal abnormality. Adrenals/Urinary Tract: Adrenal glands appear normal. Right renal cyst is noted. No hydronephrosis or renal obstruction is noted. No renal or ureteral calculi are noted. Urinary bladder is unremarkable. Stomach/Bowel: The stomach appears normal. There is no evidence of bowel obstruction. The appendix is enlarged with surrounding inflammation consistent with acute appendicitis. No definite abscess is noted. Appendix: Location: Right lower quadrant. Diameter: 10 mm. Appendicolith: No. Mucosal hyper-enhancement: Unenhanced scan. Extraluminal gas: No. Periappendiceal collection: No. Vascular/Lymphatic: Aortic atherosclerosis. No enlarged abdominal or pelvic lymph nodes. Reproductive: Uterus and bilateral adnexa are unremarkable. Other: No abdominal wall hernia or abnormality. No abdominopelvic ascites. Musculoskeletal: No acute or significant osseous findings. IMPRESSION:  Findings consistent with acute appendicitis. No definite abscess is noted. Electronically Signed   By: Marijo Conception M.D.   On: 07/04/2020 21:25    ROS:  Pertinent items are noted in HPI.  Blood pressure Marland Kitchen)  122/56, pulse 74, temperature 98.4 F (36.9 C), resp. rate 19, height 5\' 5"  (1.651 m), weight 76.6 kg, last menstrual period 02/15/2011, SpO2 98 %. Physical Exam: Hispanic white female in mild discomfort. Head is normocephalic, atraumatic Lungs clear to auscultation with good breath sounds bilaterally Heart examination reveals regular rate and rhythm without S3, S4, murmurs Abdomen is soft with tenderness in the right lower quadrant to palpation.  No rigidity noted.  CT scan images personally reviewed  Assessment/Plan: Impression: Acute appendicitis Plan: Patient be taken to the operating room this morning for laparoscopic appendectomy.  The risks and benefits of the procedure including bleeding, infection, and the possibility of an open procedure were fully explained to the patient, who gave informed consent.  Aviva Signs 07/05/2020, 7:43 AM

## 2020-07-05 NOTE — Anesthesia Postprocedure Evaluation (Signed)
Anesthesia Post Note  Patient: Amanda Zamora  Procedure(s) Performed: APPENDECTOMY LAPAROSCOPIC (N/A Abdomen)  Patient location during evaluation: PACU Anesthesia Type: General Level of consciousness: awake and alert Pain management: pain level controlled Vital Signs Assessment: post-procedure vital signs reviewed and stable Respiratory status: spontaneous breathing, nonlabored ventilation, respiratory function stable and patient connected to nasal cannula oxygen Cardiovascular status: blood pressure returned to baseline and stable Postop Assessment: no apparent nausea or vomiting Anesthetic complications: no   No complications documented.   Last Vitals:  Vitals:   07/05/20 0255 07/05/20 0804  BP: (!) 122/56 (!) 122/58  Pulse: 74 78  Resp: 19 20  Temp: 36.9 C 37.3 C  SpO2: 98% 100%    Last Pain:  Vitals:   07/05/20 0804  TempSrc: Oral  PainSc:                  Louann Sjogren

## 2020-07-05 NOTE — Transfer of Care (Signed)
Immediate Anesthesia Transfer of Care Note  Patient: Amanda Zamora  Procedure(s) Performed: APPENDECTOMY LAPAROSCOPIC (N/A Abdomen)  Patient Location: PACU  Anesthesia Type:General  Level of Consciousness: awake and drowsy  Airway & Oxygen Therapy: Patient Spontanous Breathing and Patient connected to nasal cannula oxygen  Post-op Assessment: Report given to RN and Post -op Vital signs reviewed and stable  Post vital signs: Reviewed and stable  Last Vitals:  Vitals Value Taken Time  BP 137/65 07/05/20 1006  Temp    Pulse 105 07/05/20 1006  Resp 24 07/05/20 1006  SpO2 89 % 07/05/20 1006  Vitals shown include unvalidated device data.  Last Pain:  Vitals:   07/05/20 0804  TempSrc: Oral  PainSc:          Complications: No complications documented.

## 2020-07-05 NOTE — Op Note (Signed)
Patient:  Amanda Zamora  DOB:  10-15-1960  MRN:  [id number]   Preop Diagnosis: Acute appendicitis  Postop Diagnosis: Same  Procedure: Laparoscopic appendectomy  Surgeon: Aviva Signs, MD  Anes: General endotracheal  Indications: Patient is a 60 year old [name] who presents with right lower quadrant abdominal pain.  CT scan of the abdomen reveals acute appendicitis.  The patient comes to the operating room for laparoscopic appendectomy.  The risks and benefits of the procedure including bleeding, infection, and the possibility of an open procedure were fully explained to the patient, who gave informed consent.  Procedure note: The patient was placed in supine position.  After induction of general endotracheal anesthesia, the abdomen was prepped and draped using the usual sterile technique with ChloraPrep.  Surgical site confirmation was performed.  A supraumbilical incision was made down to the fascia.  A Veress needle was introduced into the abdominal cavity and confirmation of placement was done using the saline drop test.  The abdomen was then insufflated to 15 mmHg pressure.  An 11 mm trocar was introduced into the abdominal cavity under direct visualization without difficulty.  The patient was placed in deeper Trendelenburg position with an an additional 12 mm trocar was placed in the suprapubic region and a 5 mm trocar was placed in the left lower quadrant region.  There was a small amount of purulent fluid around the appendix.  The appendix had not perforated.  The right lower quadrant was copiously irrigated with normal saline.  The mesoappendix was divided using the harmonic scalpel.  This was done up to the juncture of the appendix to the cecum.  A standard Endo GIA was placed across the base the appendix and fired.  The appendix was then removed using an Endo Catch bag.  The staple line was inspected and noted within normal limits.  All fluid and air were then evacuated from  the abdominal cavity prior to removal of the trochars.  All wounds were irrigated with normal saline.  All wounds were injected with Exparel.  The supraumbilical fascia as well as suprapubic fascia were reapproximated using 0 Vicryl interrupted sutures.  All skin incisions were closed using a 4-0 Monocryl subcuticular suture.  Dermabond was applied.  All tape and needle counts were correct at the end of the procedure.  The patient was extubated in the operating room and transferred to PACU in stable condition.  Complications: None  EBL: Minimal  Specimen: Appendix

## 2020-07-05 NOTE — H&P (View-Only) (Signed)
Reason for Consult: Acute appendicitis Referring Physician: Dr. [name] is an 60 y.o. female.  HPI: Patient is a 60 year old [name] who presents with a 24-hour history of worsening right lower quadrant abdominal pain, nausea, and vomiting.  CT scan of the abdomen revealed acute appendicitis.  Patient is status post laparoscopic cholecystectomy in November 2021.  She has had intermittent chronic right upper quadrant abdominal pain.  No fever chills have been noted.  She was admitted by the hospitalist for IV hydration.  Past Medical History:  Diagnosis Date  . Detached retina   . GERD (gastroesophageal reflux disease)   . History of pyelonephritis   . PONV (postoperative nausea and vomiting)   . Type 2 diabetes mellitus ([place])     Past Surgical History:  Procedure Laterality Date  . CHOLECYSTECTOMY N/A 12/23/2019   Procedure: LAPAROSCOPIC CHOLECYSTECTOMY;  Surgeon: Virl Cagey, MD;  Location: [facility];  Service: General;  Laterality: N/A;  . EYE SURGERY Right    retina  . TUBAL LIGATION      Family History  Problem Relation Age of Onset  . Breast cancer Sister   . Diabetes Sister     Social History:  reports that she has never smoked. She has never used smokeless tobacco. She reports previous alcohol use. She reports that she does not use drugs.  Allergies: No Known Allergies  Medications: I have reviewed the patient's current medications.  Results for orders placed or performed during the hospital encounter of 07/04/20 (from the past 48 hour(s))  CBC with Differential     Status: None   Collection Time: 07/04/20  6:39 PM  Result Value Ref Range   WBC 8.7 4.0 - 10.5 K/uL   RBC 4.24 3.87 - 5.11 MIL/uL   Hemoglobin 12.6 12.0 - 15.0 g/dL   HCT 38.5 36.0 - 46.0 %   MCV 90.8 80.0 - 100.0 fL   MCH 29.7 26.0 - 34.0 pg   MCHC 32.7 30.0 - 36.0 g/dL   RDW 13.8 11.5 - 15.5 %   Platelets 283 150 - 400 K/uL   nRBC 0.0 0.0 - 0.2 %   Neutrophils  Relative % 77 %   Neutro Abs 6.6 1.7 - 7.7 K/uL   Lymphocytes Relative 16 %   Lymphs Abs 1.4 0.7 - 4.0 K/uL   Monocytes Relative 4 %   Monocytes Absolute 0.3 0.1 - 1.0 K/uL   Eosinophils Relative 3 %   Eosinophils Absolute 0.3 0.0 - 0.5 K/uL   Basophils Relative 0 %   Basophils Absolute 0.0 0.0 - 0.1 K/uL   Immature Granulocytes 0 %   Abs Immature Granulocytes 0.03 0.00 - 0.07 K/uL    Comment: Performed at [facility], [address] [place], [place] [address]  Comprehensive metabolic panel     Status: Abnormal   Collection Time: 07/04/20  6:39 PM  Result Value Ref Range   Sodium 137 135 - 145 mmol/L   Potassium 4.0 3.5 - 5.1 mmol/L   Chloride 102 98 - 111 mmol/L   CO2 26 22 - 32 mmol/L   Glucose, Bld 151 (H) 70 - 99 mg/dL    Comment: Glucose reference range applies only to samples taken after fasting for at least 8 hours.   BUN 15 6 - 20 mg/dL   Creatinine, Ser 0.98 0.44 - 1.00 mg/dL   Calcium 9.9 8.9 - 10.3 mg/dL   Total Protein 8.0 6.5 - 8.1 g/dL   Albumin 4.4 3.5 -  5.0 g/dL   AST 19 15 - 41 U/L   ALT 20 0 - 44 U/L   Alkaline Phosphatase 75 38 - 126 U/L   Total Bilirubin 0.8 0.3 - 1.2 mg/dL   GFR, Estimated >60 >60 mL/min    Comment: (NOTE) Calculated using the CKD-EPI Creatinine Equation (2021)    Anion gap 9 5 - 15    Comment: Performed at [facility], [address] [place], [place] [address]  Lipase, blood     Status: None   Collection Time: 07/04/20  6:39 PM  Result Value Ref Range   Lipase 23 11 - 51 U/L    Comment: Performed at [facility], [address] [place], [place] [address]  Resp Panel by RT-PCR (Flu A&B, Covid) Nasopharyngeal Swab     Status: None   Collection Time: 07/04/20 10:43 PM   Specimen: Nasopharyngeal Swab; Nasopharyngeal(NP) swabs in vial transport medium  Result Value Ref Range   SARS Coronavirus 2 by RT PCR NEGATIVE NEGATIVE    Comment: (NOTE) SARS-CoV-2 target nucleic acids are NOT DETECTED.  The SARS-CoV-2 RNA is generally  detectable in upper respiratory specimens during the acute phase of infection. The lowest concentration of SARS-CoV-2 viral copies this assay can detect is 138 copies/mL. A negative result does not preclude SARS-Cov-2 infection and should not be used as the sole basis for treatment or other patient management decisions. A negative result may occur with  improper specimen collection/handling, submission of specimen other than nasopharyngeal swab, presence of viral mutation(s) within the areas targeted by this assay, and inadequate number of viral copies(<138 copies/mL). A negative result must be combined with clinical observations, patient history, and epidemiological information. The expected result is Negative.  Fact Sheet for Patients:  EntrepreneurPulse.com.au  Fact Sheet for Healthcare Providers:  IncredibleEmployment.be  This test is no t yet approved or cleared by the [place] FDA and  has been authorized for detection and/or diagnosis of SARS-CoV-2 by FDA under an Emergency Use Authorization (EUA). This EUA will remain  in effect (meaning this test can be used) for the duration of the COVID-19 declaration under Section 564(b)(1) of the Act, 21 U.S.C.section 360bbb-3(b)(1), unless the authorization is terminated  or revoked sooner.       Influenza A by PCR NEGATIVE NEGATIVE   Influenza B by PCR NEGATIVE NEGATIVE    Comment: (NOTE) The Xpert Xpress SARS-CoV-2/FLU/RSV plus assay is intended as an aid in the diagnosis of influenza from Nasopharyngeal swab specimens and should not be used as a sole basis for treatment. Nasal washings and aspirates are unacceptable for Xpert Xpress SARS-CoV-2/FLU/RSV testing.  Fact Sheet for Patients: EntrepreneurPulse.com.au  Fact Sheet for Healthcare Providers: IncredibleEmployment.be  This test is not yet approved or cleared by the [place] FDA and has  been authorized for detection and/or diagnosis of SARS-CoV-2 by FDA under an Emergency Use Authorization (EUA). This EUA will remain in effect (meaning this test can be used) for the duration of the COVID-19 declaration under Section 564(b)(1) of the Act, 21 U.S.C. section 360bbb-3(b)(1), unless the authorization is terminated or revoked.  Performed at [facility], [address], [place],  [address]   MRSA PCR Screening     Status: None   Collection Time: 07/05/20 12:20 AM   Specimen: Nasal Mucosa; Nasopharyngeal  Result Value Ref Range   MRSA by PCR NEGATIVE NEGATIVE    Comment:        The GeneXpert MRSA Assay (FDA approved for NASAL specimens only),  is one component of a comprehensive MRSA colonization surveillance program. It is not intended to diagnose MRSA infection nor to guide or monitor treatment for MRSA infections. Performed at [facility], [address] [place], [place] [address]   Glucose, capillary     Status: Abnormal   Collection Time: 07/05/20  2:53 AM  Result Value Ref Range   Glucose-Capillary 153 (H) 70 - 99 mg/dL    Comment: Glucose reference range applies only to samples taken after fasting for at least 8 hours.  CBC     Status: None   Collection Time: 07/05/20  5:55 AM  Result Value Ref Range   WBC 9.9 4.0 - 10.5 K/uL   RBC 4.33 3.87 - 5.11 MIL/uL   Hemoglobin 12.6 12.0 - 15.0 g/dL   HCT 40.2 36.0 - 46.0 %   MCV 92.8 80.0 - 100.0 fL   MCH 29.1 26.0 - 34.0 pg   MCHC 31.3 30.0 - 36.0 g/dL   RDW 13.9 11.5 - 15.5 %   Platelets 227 150 - 400 K/uL   nRBC 0.0 0.0 - 0.2 %    Comment: Performed at [facility], [address], [place], [place] [address]  Protime-INR     Status: None   Collection Time: 07/05/20  5:55 AM  Result Value Ref Range   Prothrombin Time 13.7 11.4 - 15.2 seconds   INR 1.1 0.8 - 1.2    Comment: (NOTE) INR goal varies based on device and disease states. Performed at [facility], [address], [place], [place]  [address]   APTT     Status: Abnormal   Collection Time: 07/05/20  5:55 AM  Result Value Ref Range   aPTT 23 (L) 24 - 36 seconds    Comment: Performed at [facility], [address] , [place] [address]  Glucose, capillary     Status: Abnormal   Collection Time: 07/05/20  7:12 AM  Result Value Ref Range   Glucose-Capillary 119 (H) 70 - 99 mg/dL    Comment: Glucose reference range applies only to samples taken after fasting for at least 8 hours.    CT ABDOMEN PELVIS WO CONTRAST  Result Date: 07/04/2020 CLINICAL DATA:  Right-sided abdominal pain. EXAM: CT ABDOMEN AND PELVIS WITHOUT CONTRAST TECHNIQUE: Multidetector CT imaging of the abdomen and pelvis was performed following the standard protocol without IV contrast. COMPARISON:  March 31, 2020. FINDINGS: Lower chest: No acute abnormality. Hepatobiliary: No focal liver abnormality is seen. Status post cholecystectomy. No biliary dilatation. Pancreas: Unremarkable. No pancreatic ductal dilatation or surrounding inflammatory changes. Spleen: Normal in size without focal abnormality. Adrenals/Urinary Tract: Adrenal glands appear normal. Right renal cyst is noted. No hydronephrosis or renal obstruction is noted. No renal or ureteral calculi are noted. Urinary bladder is unremarkable. Stomach/Bowel: The stomach appears normal. There is no evidence of bowel obstruction. The appendix is enlarged with surrounding inflammation consistent with acute appendicitis. No definite abscess is noted. Appendix: Location: Right lower quadrant. Diameter: 10 mm. Appendicolith: No. Mucosal hyper-enhancement: Unenhanced scan. Extraluminal gas: No. Periappendiceal collection: No. Vascular/Lymphatic: Aortic atherosclerosis. No enlarged abdominal or pelvic lymph nodes. Reproductive: Uterus and bilateral adnexa are unremarkable. Other: No abdominal wall hernia or abnormality. No abdominopelvic ascites. Musculoskeletal: No acute or significant osseous findings. IMPRESSION:  Findings consistent with acute appendicitis. No definite abscess is noted. Electronically Signed   By: Marijo Conception M.D.   On: 07/04/2020 21:25    ROS:  Pertinent items are noted in HPI.  Blood pressure Marland Kitchen)  122/56, pulse 74, temperature 98.4 F (36.9 C), resp. rate 19, height 5\' 5"  (1.651 m), weight 76.6 kg, last menstrual period 02/15/2011, SpO2 98 %. Physical Exam: Hispanic white female in mild discomfort. Head is normocephalic, atraumatic Lungs clear to auscultation with good breath sounds bilaterally Heart examination reveals regular rate and rhythm without S3, S4, murmurs Abdomen is soft with tenderness in the right lower quadrant to palpation.  No rigidity noted.  CT scan images personally reviewed  Assessment/Plan: Impression: Acute appendicitis Plan: Patient be taken to the operating room this morning for laparoscopic appendectomy.  The risks and benefits of the procedure including bleeding, infection, and the possibility of an open procedure were fully explained to the patient, who gave informed consent.  Aviva Signs 07/05/2020, 7:43 AM

## 2020-07-06 ENCOUNTER — Encounter (HOSPITAL_COMMUNITY): Payer: Self-pay | Admitting: General Surgery

## 2020-07-06 LAB — BASIC METABOLIC PANEL
Anion gap: 8 (ref 5–15)
BUN: 21 mg/dL — ABNORMAL HIGH (ref 6–20)
CO2: 24 mmol/L (ref 22–32)
Calcium: 7.9 mg/dL — ABNORMAL LOW (ref 8.9–10.3)
Chloride: 105 mmol/L (ref 98–111)
Creatinine, Ser: 1.04 mg/dL — ABNORMAL HIGH (ref 0.44–1.00)
GFR, Estimated: >60 mL/min (ref >60)
Glucose, Bld: 237 mg/dL — ABNORMAL HIGH (ref 70–99)
Potassium: 4.3 mmol/L (ref 3.5–5.1)
Sodium: 137 mmol/L (ref 135–145)

## 2020-07-06 LAB — CBC
HCT: 33.1 % — ABNORMAL LOW (ref 36.0–46.0)
Hemoglobin: 10.1 g/dL — ABNORMAL LOW (ref 12.0–15.0)
MCH: 28.9 pg (ref 26.0–34.0)
MCHC: 30.5 g/dL (ref 30.0–36.0)
MCV: 94.6 fL (ref 80.0–100.0)
Platelets: 206 10*3/uL (ref 150–400)
RBC: 3.5 MIL/uL — ABNORMAL LOW (ref 3.87–5.11)
RDW: 14.3 % (ref 11.5–15.5)
WBC: 11.2 10*3/uL — ABNORMAL HIGH (ref 4.0–10.5)
nRBC: 0 % (ref 0.0–0.2)

## 2020-07-06 LAB — GLUCOSE, CAPILLARY
Glucose-Capillary: 234 mg/dL — ABNORMAL HIGH (ref 70–99)
Glucose-Capillary: 166 mg/dL — ABNORMAL HIGH (ref 70–99)

## 2020-07-06 MED ORDER — OXYCODONE HCL 5 MG PO TABS: 5.0000 mg | ORAL_TABLET | Freq: Four times a day (QID) | ORAL | 0 refills | Status: DC | PRN

## 2020-07-06 NOTE — Discharge Summary (Signed)
Physician Discharge Summary  Patient ID: Amanda Zamora MRN: [id number] DOB/AGE: 06-28-1960 60 y.o.  Admit date: 07/04/2020 Discharge date: 07/06/2020  Admission Diagnoses: Acute appendicitis  Discharge Diagnoses: Same Principal Problem:   Acute appendicitis Active Problems:   Hyperlipidemia   HTN (hypertension)   GERD (gastroesophageal reflux disease)   Abdominal pain   Vomiting   Hyperglycemia due to diabetes mellitus [facility])   Discharged Condition: good  Hospital Course: Patient is a 60 year old [name] who presented to the emergency room with right lower quadrant abdominal pain.  CT scan of the abdomen revealed acute appendicitis.  The patient underwent laparoscopic appendectomy on 07/05/2020.  She tolerated the surgery well.  Her postoperative course has been unremarkable.  Her diet was advanced without difficulty.  The patient is being discharged home on 07/06/2020 in good and improving condition.   Treatments: surgery: Laparoscopic appendectomy on 07/05/2020  Discharge Exam: Blood pressure (!) 108/48, pulse 65, temperature 98 F (36.7 C), temperature source Oral, resp. rate 19, height 5\' 5"  (1.651 m), weight 76.6 kg, last menstrual period 02/15/2011, SpO2 99 %. General appearance: alert, cooperative and no distress Resp: clear to auscultation bilaterally Cardio: regular rate and rhythm, S1, S2 normal, no murmur, click, rub or gallop GI: Soft, incisions healing well.  Disposition: Discharge disposition: 01-Home or Self Care       Discharge Instructions    Diet - low sodium heart healthy   Complete by: As directed    Increase activity slowly   Complete by: As directed      Allergies as of 07/06/2020   No Known Allergies     Medication List    TAKE these medications   diclofenac Sodium 1 % Gel Commonly known as: Voltaren Apply 2 g topically 4 (four) times daily.   Fish Oil 1000 MG Caps Take 1 capsule (1,000 mg total) by mouth in the morning and at  bedtime.   gabapentin 300 MG capsule Commonly known as: [place] one capsule in am and lunchtime; then 2 capsules at bedtime. What changed:   how much to take  how to take this  when to take this  additional instructions   losartan 100 MG tablet Commonly known as: COZAAR Take 1 tablet (100 mg total) by mouth daily. Tome una tableta por boca diaria   metFORMIN 1000 MG tablet Commonly known as: GLUCOPHAGE TAKE 1 TABLET BY MOUTH TWICE DAILY WITH A MEAL.   Tome una tableta por boca dos veces diarias con comida What changed:   how much to take  how to take this  when to take this  additional instructions   methocarbamol 500 MG tablet Commonly known as: Robaxin Take 1 tablet (500 mg total) by mouth every 8 (eight) hours as needed for muscle spasms.   NovoLIN 70/30 ReliOn (70-30) 100 UNIT/ML injection Generic drug: insulin NPH-regular Human Inject 60 units SQ before breakfast and 40 units before evening meal.   Injecte 60 unidades subcutaneamente dos veces al dia con el alimento y 40 unidades antes de el alimento de la tarde   omeprazole 20 MG capsule Commonly known as: PRILOSEC Take 1 capsule (20 mg total) by mouth daily. Tome una tableta por boca diaria   oxyCODONE 5 MG immediate release tablet Commonly known as: Roxicodone Take 1 tablet (5 mg total) by mouth every 6 (six) hours as needed for severe pain. What changed: when to take this   rosuvastatin 20 MG tablet Commonly known as: Crestor Take 1 tablet (20 mg total)  by mouth daily.       Follow-up Information    Aviva Signs, MD Follow up.   Specialty: General Surgery Why: As needed Contact information: [id number] [place] [id number] [phone]               Signed: Aviva Signs 07/06/2020, 11:31 AM

## 2020-07-06 NOTE — Discharge Instructions (Signed)
Apendicectoma laparoscpica en adultos, cuidados posteriores Laparoscopic Appendectomy, Adult, Care After Esta hoja le brinda informacin sobre cmo cuidarse despus del procedimiento. El mdico tambin podr darle indicaciones ms especficas. Si tiene problemas o preguntas, llame al mdico. Qu puedo esperar despus del procedimiento? Despus del procedimiento, es comn [employer] siguientes sntomas:  Poca energa para Optometrist las actividades normales.  Dolor leve en la zona donde se realizaron los cortes para la ciruga (incisiones).  Dificultad para defecar (estreimiento). Esto puede ser consecuencia de: ? Analgsicos. ? Falta de [place]. Siga estas indicaciones en su casa: Medicamentos  [employer] de venta libre y los recetados solamente como se lo haya indicado el mdico.  Si le recetaron un antibitico, tmelo como se lo haya indicado el mdico. No deje de tomarlo aunque comience a sentirse mejor.  No conduzca ni use maquinaria pesada mientras toma analgsicos recetados.  Consulte a su mdico si el medicamento que toma puede causarle problemas para defecar. Es posible que deba tomar medidas para prevenir o tratar los problemas para defecar: ? Beba suficiente lquido para Contractor pis (la orina) de color amarillo plido. ? Tome medicamentos recetados o de venta [place]. ? Coma alimentos ricos en fibra. Entre ellos, frijoles, cereales integrales y frutas y verduras frescas. ? Limite los alimentos con alto contenido de [place] y Location manager. Estos incluyen alimentos fritos o dulces. Cuidado de la incisin  Siga las indicaciones del mdico en lo que respecta al cuidado de los cortes de la [place]. Asegrese de hacer lo siguiente: ? Lvese las manos con agua y [place] antes y despus de cambiar la venda (vendaje). Use un desinfectante para manos si no dispone de [place] y [place]. ? Cambie las [employer] se lo haya indicado el mdico. ? No retire los puntos (suturas), la goma  para cerrar la piel o las tiras de cinta [place]). Tal vez deban dejarse puestos en la piel durante 2semanas o ms. Si las tiras [place] se despegan y se enroscan, puede recortar los bordes sueltos. No retire las tiras [facility] por completo a menos que el mdico lo autorice.  Controle los cortes de la ciruga todos los das para detectar signos de infeccin. Est atento a los siguientes signos: ? Enrojecimiento, hinchazn o dolor. ? Lquido o sangre. ? Calor. ? Pus o mal olor.   Baarse  Mantenga los cortes de la ciruga limpios y secos. Lmpielos como se lo haya indicado el mdico. Para hacer esto: 1. Lave los cortes suavemente con agua y [place]. 2. Enjuague los cortes con agua para quitar todo el [place]. 3. Seque bien los cortes con una toalla limpia dando golpecitos. No frote [employer] cortes.  No tome baos de inmersin, no nade ni use un jacuzzi durante 2 semanas o hasta que el mdico lo autorice. Puede tomar una ducha despus de 48horas. Actividad  No conduzca durante 24horas si recibi un medicamento para ayudarlo a relajarse (sedante) durante el procedimiento.  Descanse despus del procedimiento. Retome sus actividades habituales como se lo haya indicado el mdico. Pregntele al mdico qu actividades son seguras para usted.  Durante 3semanas o el tiempo que le haya indicado el mdico: ? No levante objetos que pesen ms de 10libras (4,5kg) o que sean ms pesados de lo que le indicaron. ? No practique deportes de contacto.   Indicaciones generales  Si lo enviaron a su casa con un drenaje, siga las indicaciones del mdico en lo que respecta al cuidado.  Haga respiraciones profundas. Esto ayuda a Chemical engineer  una infeccin en los pulmones (neumona).  Concurra a todas las visitas de seguimiento como se lo haya indicado el mdico. Esto es importante. Comunquese con un mdico si:  Tiene enrojecimiento, hinchazn o dolor alrededor de un corte de la ciruga.  Observa lquido  o [employer] de uno de los cortes.  El corte est caliente al tacto.  Tiene pus o un olor ftido que provienen de uno de los cortes o de un vendaje.  Se abren los bordes de uno de los cortes despus de que se retiraron los puntos.  Tiene dolor en los hombros que [place].  Se siente mareado o se desvanece (se desmaya).  Le falta el aire.  Siente malestar estomacal (nuseas) constante.  No deja de vomitar.  Tiene heces lquidas (diarrea) o no puede controlar la defecacin.  Pierde el apetito.  Presenta dolor o hinchazn en las piernas.  Aparece una erupcin cutnea. Solicite ayuda inmediatamente si:  Tiene fiebre.  Tiene dificultad para respirar.  Siente dolores fuertes [employer]. Resumen  Despus del procedimiento, es comn tener poca energa, dolor leve y dificultad para defecar.  La infeccin es un problema frecuente despus de este procedimiento. Siga las indicaciones de su mdico acerca de cmo cuidarse despus del procedimiento.  Descanse despus del procedimiento. Retome sus actividades habituales como se lo haya indicado el mdico.  Comunquese con su mdico si observa signos de infeccin alrededor [employer] cortes de la ciruga o si le falta el aire. Busque ayuda de inmediato si tiene fiebre, dolor de pecho o problemas para Ambulance person. Esta informacin no tiene Marine scientist el consejo del mdico. Asegrese de hacerle al mdico cualquier pregunta que tenga. Document Revised: 10/02/2017 Document Reviewed: 10/02/2017 Elsevier Patient Education  [place].

## 2020-07-06 NOTE — Progress Notes (Signed)
Patient Demographics:    Amanda Zamora, is a 60 y.o. female, DOB - May 13, 1960, JWJ:[id number]  Admit date - 07/04/2020   Admitting Physician Bernadette Hoit, DO  Outpatient Primary MD for the patient is Soyla Dryer, PA-C  LOS - 2   Chief Complaint  Patient presents with  . Abdominal Pain    Right side        Subjective:    Amanda Zamora today has no fevers, no emesis,  No chest pain,  Post op abd pain is better Seen and eval by gen surgeon this am who recommends discharge home  Assessment  & Plan :    Principal Problem:   Acute appendicitis Active Problems:   Hyperlipidemia   HTN (hypertension)   GERD (gastroesophageal reflux disease)   Abdominal pain   Vomiting   Hyperglycemia due to diabetes mellitus ([place])    1)DM2-A1c 8.6 reflecting uncontrolled DM with hyperglycemia PTA Resume metformin and 70/30 insulin -Follow-up with PCP for further adjustments of diabetic regimen  2)HTN-continue losartan  3)HLD---c/n Crestor  4)GERD Continue Protonix  5)status post appendectomy on 07/05/2020 further management per general surgeon -Post op abd pain is better Seen and eval by gen surgeon this am who recommends discharge home   Disposition-home   Disposition: The patient is from: Home              Anticipated d/c is to: Home              Code Status : -  Code Status: Full Code   Family Communication:   NA (patient is alert, awake and coherent)   Consults  :  Gen surgery  Lab Results  Component Value Date   PLT 206 07/06/2020    Inpatient Medications  Scheduled Meds: . enoxaparin (LOVENOX) injection  40 mg Subcutaneous Q24H  . insulin aspart  0-15 Units Subcutaneous TID WC  . insulin aspart  0-5 Units Subcutaneous QHS  . losartan  100 mg Oral Daily  . metFORMIN  1,000 mg Oral BID WC  . rosuvastatin  20 mg Oral q1800   Continuous Infusions: . sodium chloride  100 mL/hr at 07/05/20 2246  . cefTRIAXone (ROCEPHIN)  IV 2 g (07/06/20 0803)   And  . metronidazole 500 mg (07/06/20 0805)   PRN Meds:.acetaminophen **OR** acetaminophen, diphenhydrAMINE **OR** diphenhydrAMINE, fentaNYL (SUBLIMAZE) injection, [COMPLETED] ketorolac **FOLLOWED BY** ketorolac, LORazepam, methocarbamol, ondansetron **OR** ondansetron (ZOFRAN) IV, oxyCODONE, simethicone    Anti-infectives (From admission, onward)   Start     Dose/Rate Route Frequency Ordered Stop   07/06/20 0800  cefTRIAXone (ROCEPHIN) 2 g in sodium chloride 0.9 % 100 mL IVPB       "And" Linked Group Details   2 g 200 mL/hr over 30 Minutes Intravenous Every 24 hours 07/05/20 1047     07/05/20 1600  metroNIDAZOLE (FLAGYL) IVPB 500 mg       "And" Linked Group Details   500 mg 100 mL/hr over 60 Minutes Intravenous Every 8 hours 07/05/20 1047     07/05/20 0830  cefTRIAXone (ROCEPHIN) 2 g in sodium chloride 0.9 % 100 mL IVPB        2 g 200 mL/hr over 30 Minutes Intravenous  Once 07/05/20 0740 07/05/20 0856   07/05/20 0830  metroNIDAZOLE (FLAGYL) IVPB 500 mg        500 mg 100 mL/hr over 60 Minutes Intravenous  Once 07/05/20 0740 07/05/20 0923        Objective:   Vitals:   07/05/20 1315 07/05/20 1751 07/05/20 2116 07/06/20 0601  BP: (!) 106/53 (!) 104/54 (!) 101/47 (!) 108/48  Pulse: 67 70 70 65  Resp: 18 18 18 19   Temp: 98 F (36.7 C) 98.3 F (36.8 C) 98.2 F (36.8 C) 98 F (36.7 C)  TempSrc: Oral Oral Oral Oral  SpO2: 95% 96% 93% 99%  Weight:      Height:        Wt Readings from Last 3 Encounters:  07/05/20 76.6 kg  06/11/20 71.7 kg  06/11/20 78 kg     Intake/Output Summary (Last 24 hours) at 07/06/2020 1341 Last data filed at 07/06/2020 0900 Gross per 24 hour  Intake 1063.74 ml  Output --  Net 1063.74 ml   Physical Exam  Gen:- Awake Alert,  In no apparent distress  HEENT:- [place].AT, No sclera icterus Neck-Supple Neck,No JVD,.  Lungs-  CTAB , fair symmetrical air movement CV- S1, S2  normal, regular  Abd-  +ve B.Sounds, Abd Soft, appropriate postop tenderness,    Extremity/Skin:- No  edema, pedal pulses present  Psych-affect is appropriate, oriented x3 Neuro-no new focal deficits, no tremors   Data Review:   Micro Results Recent Results (from the past 240 hour(s))  Resp Panel by RT-PCR (Flu A&B, Covid) Nasopharyngeal Swab     Status: None   Collection Time: 07/04/20 10:43 PM   Specimen: Nasopharyngeal Swab; Nasopharyngeal(NP) swabs in vial transport medium  Result Value Ref Range Status   SARS Coronavirus 2 by RT PCR NEGATIVE NEGATIVE Final    Comment: (NOTE) SARS-CoV-2 target nucleic acids are NOT DETECTED.  The SARS-CoV-2 RNA is generally detectable in upper respiratory specimens during the acute phase of infection. The lowest concentration of SARS-CoV-2 viral copies this assay can detect is 138 copies/mL. A negative result does not preclude SARS-Cov-2 infection and should not be used as the sole basis for treatment or other patient management decisions. A negative result may occur with  improper specimen collection/handling, submission of specimen other than nasopharyngeal swab, presence of viral mutation(s) within the areas targeted by this assay, and inadequate number of viral copies(<138 copies/mL). A negative result must be combined with clinical observations, patient history, and epidemiological information. The expected result is Negative.  Fact Sheet for Patients:  EntrepreneurPulse.com.au  Fact Sheet for Healthcare Providers:  IncredibleEmployment.be  This test is no t yet approved or cleared by the [place] FDA and  has been authorized for detection and/or diagnosis of SARS-CoV-2 by FDA under an Emergency Use Authorization (EUA). This EUA will remain  in effect (meaning this test can be used) for the duration of the COVID-19 declaration under Section 564(b)(1) of the Act, 21 U.S.C.section  360bbb-3(b)(1), unless the authorization is terminated  or revoked sooner.       Influenza A by PCR NEGATIVE NEGATIVE Final   Influenza B by PCR NEGATIVE NEGATIVE Final    Comment: (NOTE) The Xpert Xpress SARS-CoV-2/FLU/RSV plus assay is intended as an aid in the diagnosis of influenza from Nasopharyngeal swab specimens and should not be used as a sole basis for treatment. Nasal washings and aspirates are unacceptable for Xpert Xpress SARS-CoV-2/FLU/RSV testing.  Fact Sheet for Patients: EntrepreneurPulse.com.au  Fact Sheet for Healthcare Providers: IncredibleEmployment.be  This test is not yet approved or cleared  by the [place] and has been authorized for detection and/or diagnosis of SARS-CoV-2 by FDA under an Emergency Use Authorization (EUA). This EUA will remain in effect (meaning this test can be used) for the duration of the COVID-19 declaration under Section 564(b)(1) of the Act, 21 U.S.C. section 360bbb-3(b)(1), unless the authorization is terminated or revoked.  Performed at [facility], [address], [place], [place] [address]   MRSA PCR Screening     Status: None   Collection Time: 07/05/20 12:20 AM   Specimen: Nasal Mucosa; Nasopharyngeal  Result Value Ref Range Status   MRSA by PCR NEGATIVE NEGATIVE Final    Comment:        The GeneXpert MRSA Assay (FDA approved for NASAL specimens only), is one component of a comprehensive MRSA colonization surveillance program. It is not intended to diagnose MRSA infection nor to guide or monitor treatment for MRSA infections. Performed at [facility], [address] [place],  [address]     Radiology Reports CT ABDOMEN PELVIS WO CONTRAST  Result Date: 07/04/2020 CLINICAL DATA:  Right-sided abdominal pain. EXAM: CT ABDOMEN AND PELVIS WITHOUT CONTRAST TECHNIQUE: Multidetector CT imaging of the abdomen and pelvis was performed following the standard protocol  without IV contrast. COMPARISON:  March 31, 2020. FINDINGS: Lower chest: No acute abnormality. Hepatobiliary: No focal liver abnormality is seen. Status post cholecystectomy. No biliary dilatation. Pancreas: Unremarkable. No pancreatic ductal dilatation or surrounding inflammatory changes. Spleen: Normal in size without focal abnormality. Adrenals/Urinary Tract: Adrenal glands appear normal. Right renal cyst is noted. No hydronephrosis or renal obstruction is noted. No renal or ureteral calculi are noted. Urinary bladder is unremarkable. Stomach/Bowel: The stomach appears normal. There is no evidence of bowel obstruction. The appendix is enlarged with surrounding inflammation consistent with acute appendicitis. No definite abscess is noted. Appendix: Location: Right lower quadrant. Diameter: 10 mm. Appendicolith: No. Mucosal hyper-enhancement: Unenhanced scan. Extraluminal gas: No. Periappendiceal collection: No. Vascular/Lymphatic: Aortic atherosclerosis. No enlarged abdominal or pelvic lymph nodes. Reproductive: Uterus and bilateral adnexa are unremarkable. Other: No abdominal wall hernia or abnormality. No abdominopelvic ascites. Musculoskeletal: No acute or significant osseous findings. IMPRESSION: Findings consistent with acute appendicitis. No definite abscess is noted. Electronically Signed   By: Marijo Conception M.D.   On: 07/04/2020 21:25     CBC Recent Labs  Lab 07/04/20 1839 07/05/20 0555 07/06/20 0405  WBC 8.7 9.9 11.2*  HGB 12.6 12.6 10.1*  HCT 38.5 40.2 33.1*  PLT 283 227 206  MCV 90.8 92.8 94.6  MCH 29.7 29.1 28.9  MCHC 32.7 31.3 30.5  RDW 13.8 13.9 14.3  LYMPHSABS 1.4  --   --   MONOABS 0.3  --   --   EOSABS 0.3  --   --   BASOSABS 0.0  --   --     Chemistries  Recent Labs  Lab 07/04/20 1839 07/05/20 0555 07/06/20 0405  NA 137 137 137  K 4.0 4.2 4.3  CL 102 101 105  CO2 26 27 24   GLUCOSE 151* 139* 237*  BUN 15 14 21*  CREATININE 0.98 0.83 1.04*  CALCIUM 9.9 8.9  7.9*  MG  --  1.7  --   AST 19 16  --   ALT 20 17  --   ALKPHOS 75 62  --   BILITOT 0.8 1.1  --    ------------------------------------------------------------------------------------------------------------------ No results for input(s): CHOL, HDL, LDLCALC, TRIG, CHOLHDL, LDLDIRECT in the last 72 hours.  Lab Results  Component  Value Date   HGBA1C 8.6 (H) 07/05/2020   ------------------------------------------------------------------------------------------------------------------ No results for input(s): TSH, T4TOTAL, T3FREE, THYROIDAB in the last 72 hours.  Invalid input(s): FREET3 ------------------------------------------------------------------------------------------------------------------ No results for input(s): VITAMINB12, FOLATE, FERRITIN, TIBC, IRON, RETICCTPCT in the last 72 hours.  Coagulation profile Recent Labs  Lab 07/05/20 0555  INR 1.1    No results for input(s): DDIMER in the last 72 hours.  Cardiac Enzymes No results for input(s): CKMB, TROPONINI, MYOGLOBIN in the last 168 hours.  Invalid input(s): CK ------------------------------------------------------------------------------------------------------------------ No results found for: BNP   Roxan Hockey M.D on 07/06/2020 at 1:41 PM  Go to [email or web address] - for contact info  [facility] - Office  [phone]

## 2020-07-07 LAB — SURGICAL PATHOLOGY

## 2020-07-09 ENCOUNTER — Encounter: Payer: Self-pay | Admitting: Physician Assistant

## 2020-07-09 ENCOUNTER — Ambulatory Visit: Payer: Self-pay | Admitting: Physician Assistant

## 2020-07-09 ENCOUNTER — Other Ambulatory Visit: Payer: Self-pay

## 2020-07-09 VITALS — BP 149/71 | HR 68 | Temp 97.2°F | Wt 161.0 lb

## 2020-07-09 DIAGNOSIS — K5900 Constipation, unspecified: Secondary | ICD-10-CM

## 2020-07-09 DIAGNOSIS — M25511 Pain in right shoulder: Secondary | ICD-10-CM

## 2020-07-09 DIAGNOSIS — R1084 Generalized abdominal pain: Secondary | ICD-10-CM

## 2020-07-09 DIAGNOSIS — F32A Depression, unspecified: Secondary | ICD-10-CM

## 2020-07-09 DIAGNOSIS — Z789 Other specified health status: Secondary | ICD-10-CM

## 2020-07-09 MED ORDER — METHOCARBAMOL 500 MG PO TABS: 500.0000 mg | ORAL_TABLET | Freq: Three times a day (TID) | ORAL | 0 refills | Status: DC | PRN

## 2020-07-09 NOTE — Progress Notes (Signed)
BP (!) 149/71   Pulse 68   Temp (!) 97.2 F (36.2 C)   Wt 161 lb (73 kg)   LMP 02/15/2011 (Approximate)   SpO2 98%   BMI 26.79 kg/m    Subjective:    Patient ID: Amanda Zamora, female    DOB: 1960/03/20, 60 y.o.   MRN: [id number]  HPI: Amanda Zamora is a 60 y.o. female presenting on 07/09/2020 for No chief complaint on file.   HPI     Pt had a negative covid 19 screening questionnaire.      Pt is 60yoF who presents for follow up.  When she was last seen in office, she was having depression and was sent to hospital for SI evaluation.   She says her mood is better.  She says she got a lettter saying she needs insurance to get an appointment.  She doesn't have it with her.  Pt was in hospital recently and underwent appendectomy.  She hasn't pooped since she had diarrhea on Saturday.  She is taking oxycodone for pain.  Pain right shoulder goes away with pain meds but is there without it.   She doesn't think it's any better.   Pt has been seen for the shoulder pain previously.  She says she is supposed to follow up with surgeon in 2 wk.  She says she is still having pain but it is not more than it was when she was in the hospital.     Relevant past medical, surgical, family and social history reviewed and updated as indicated. Interim medical history since our last visit reviewed. Allergies and medications reviewed and updated.   Current Outpatient Medications:  .  diclofenac Sodium (VOLTAREN) 1 % GEL, Apply 2 g topically 4 (four) times daily., Disp: 20 g, Rfl: 0 .  dimenhyDRINATE (DRAMAMINE PO), Take by mouth. She is using the patch, Disp: , Rfl:  .  Docusate Sodium (STOOL SOFTENER LAXATIVE PO), Take by mouth., Disp: , Rfl:  .  gabapentin (NEURONTIN) 300 MG capsule, Tome one capsule in am and lunchtime; then 2 capsules at bedtime. (Patient taking differently: Take 300-600 mg by mouth 2 (two) times daily. Take one capsule in am and lunchtime; then 2 capsules at  bedtime.), Disp: 120 capsule, Rfl: 3 .  insulin NPH-regular Human (NOVOLIN 70/30 RELION) (70-30) 100 UNIT/ML injection, Inject 60 units SQ before breakfast and 40 units before evening meal.   Injecte 60 unidades subcutaneamente dos veces al dia con el alimento y 40 unidades antes de el alimento de la tarde, Disp: 10 mL, Rfl: 0 .  losartan (COZAAR) 100 MG tablet, Take 1 tablet (100 mg total) by mouth daily. Tome una tableta por boca diaria, Disp: 90 tablet, Rfl: 1 .  metFORMIN (GLUCOPHAGE) 1000 MG tablet, TAKE 1 TABLET BY MOUTH TWICE DAILY WITH A MEAL.   Tome una tableta por boca dos veces diarias con comida (Patient taking differently: Take 1,000 mg by mouth 2 (two) times daily with a meal.), Disp: 180 tablet, Rfl: 0 .  Omega-3 Fatty Acids (FISH OIL) 1000 MG CAPS, Take 1 capsule (1,000 mg total) by mouth in the morning and at bedtime., Disp: 60 capsule, Rfl: 2 .  omeprazole (PRILOSEC) 20 MG capsule, Take 1 capsule (20 mg total) by mouth daily. Tome una tableta por boca diaria, Disp: 90 capsule, Rfl: 1 .  oxyCODONE (ROXICODONE) 5 MG immediate release tablet, Take 1 tablet (5 mg total) by mouth every 6 (six) hours as needed for severe pain.,  Disp: 25 tablet, Rfl: 0 .  rosuvastatin (CRESTOR) 20 MG tablet, Take 1 tablet (20 mg total) by mouth daily., Disp: 30 tablet, Rfl: 3 .  methocarbamol (ROBAXIN) 500 MG tablet, Take 1 tablet (500 mg total) by mouth every 8 (eight) hours as needed for muscle spasms. (Patient not taking: Reported on 07/09/2020), Disp: 45 tablet, Rfl: 0    Review of Systems  Per HPI unless specifically indicated above     Objective:    BP (!) 149/71   Pulse 68   Temp (!) 97.2 F (36.2 C)   Wt 161 lb (73 kg)   LMP 02/15/2011 (Approximate)   SpO2 98%   BMI 26.79 kg/m   Wt Readings from Last 3 Encounters:  07/09/20 161 lb (73 kg)  07/05/20 168 lb 14 oz (76.6 kg)  06/11/20 158 lb (71.7 kg)    Physical Exam Vitals reviewed.  Constitutional:      General: She is not in  acute distress.    Appearance: She is well-developed. She is not toxic-appearing.  HENT:     Head: Normocephalic and atraumatic.  Cardiovascular:     Rate and Rhythm: Normal rate and regular rhythm.  Pulmonary:     Effort: Pulmonary effort is normal.     Breath sounds: Normal breath sounds.  Abdominal:     General: Bowel sounds are normal.     Palpations: Abdomen is soft. There is no mass.     Tenderness: There is generalized abdominal tenderness. There is no guarding or rebound.     Comments: Recent lap surgery incisions healing without signs infection.    Musculoskeletal:     Cervical back: Neck supple.     Right lower leg: No edema.     Left lower leg: No edema.  Lymphadenopathy:     Cervical: No cervical adenopathy.  Skin:    General: Skin is warm and dry.  Neurological:     Mental Status: She is alert and oriented to person, place, and time.  Psychiatric:        Attention and Perception: Attention normal.        Mood and Affect: Affect is not tearful or inappropriate.        Behavior: Behavior normal. Behavior is cooperative.     Comments: Mood improved             Assessment & Plan:    Encounter Diagnoses  Name Primary?  . Depression, unspecified depression type Yes  . Constipation, unspecified constipation type   . Generalized abdominal pain   . Right shoulder pain, unspecified chronicity   . Not proficient in [place] language       -discussed constipation can result when using opiods.  encouraged pt to drink plenty of water and can use OTC miralax -pt encouraged to follow up with [facility] for depression.  Discussed walk-in option -pt to follow up with surgeon per their recommendation -discussed shoulder pain with pt who agrees to defer this until next appointment when her abdominal pain will be improved -pt to follow up 1 month.  She is to contact office sooner prn

## 2020-07-09 NOTE — Patient Instructions (Signed)
[facility]    ----------------------------------------   Estreimiento, en adultos Constipation, Adult Estreimiento significa que una persona hace menos de tres deposiciones en una semana, tiene dificultades para defecar o las heces (deposiciones) son secas, duras o ms grandes de lo normal. La causa puede ser una afeccin subyacente. Puede empeorar con la edad si una persona toma ciertos medicamentos y no toma suficiente lquido. Siga estas instrucciones en su casa: Comida y bebida  Consuma alimentos con alto contenido de [place], como frijoles, cereales integrales, y frutas y verduras frescas.  Limite el consumo de alimentos que sean bajos en fibra y ricos en grasas y azcares procesados, como los fritos y los dulces. Estos incluyen patatas fritas, hamburguesas, galletas, dulces y refrescos.  Beba suficiente lquido como para Theatre manager la orina de color amarillo plido.   Instrucciones generales  Haga actividad fsica habitualmente o como se lo haya indicado el mdico. Intente practicar 132minutos de actividad fsica moderada por semana.  Vaya al bao siempre que sienta ganas de defecar. No se aguante las ganas.  Use los medicamentos de venta libre y los recetados solamente como se lo haya indicado el mdico. Esto incluye suplementos de [place].  En el momento de hacer sus deposiciones: ? Respire profundamente mientras relaja la parte inferior del abdomen. ? Practique la relajacin del suelo plvico.  Controle su afeccin para detectar cualquier cambio. Informe a su mdico acerca de cualquier cambio.  Concurra a todas las visitas de seguimiento como se lo haya indicado el mdico. Esto es importante. Comunquese con un mdico si:  Su dolor empeora.  Tiene fiebre.  No defeca despus de 4das.  Vomita.  Baja de [place] o no tiene apetito.  Tiene sangrado por la abertura entre las nalgas (ano).  Las heces son delgadas como un lpiz. Solicite ayuda de inmediato si:  Jaclynn Guarneri, y los sntomas empeoran repentinamente.  Observa que se filtran heces o hay sangre en las heces.  Tiene el abdomen distendido.  Siente un dolor intenso en el abdomen.  Se siente mareado o se desmaya. Resumen  Estreimiento significa que una persona hace menos de tres deposiciones en una semana, tiene dificultades para defecar o las heces (deposiciones) son secas, duras o ms grandes de lo normal.  Consuma alimentos con alto contenido de [place], como frijoles, cereales integrales, y frutas y verduras frescas.  Beba suficiente lquido como para Theatre manager la orina de color amarillo plido.  Use los medicamentos de venta libre y los recetados solamente como se lo haya indicado el mdico. Esto incluye suplementos de [place]. Esta informacin no tiene Marine scientist el consejo del mdico. Asegrese de hacerle al mdico cualquier pregunta que tenga. Document Revised: 03/08/2019 Document Reviewed: 03/08/2019 Elsevier Patient Education  2021 [employer].

## 2020-07-17 ENCOUNTER — Telehealth (INDEPENDENT_AMBULATORY_CARE_PROVIDER_SITE_OTHER): Payer: Self-pay | Admitting: General Surgery

## 2020-07-17 DIAGNOSIS — Z09 Encounter for follow-up examination after completed treatment for conditions other than malignant neoplasm: Secondary | ICD-10-CM

## 2020-07-17 NOTE — Telephone Encounter (Signed)
Patient's daughter called me back on a hospital phone line.  I attempted to call the patient, but she did not answer.  Patient's daughter states that her mother is doing better.  She has mild incisional pain.  No nausea or vomiting have been noted.  Her incisions apparently are healing well.  I told the daughter that should she continue to have the pain next week, I would like to see her mother in the office.  She understands and agrees.  As this was part of the global surgical fee, this was not a billable visit.  Total telephone time was 3 minutes.

## 2020-07-28 ENCOUNTER — Ambulatory Visit (INDEPENDENT_AMBULATORY_CARE_PROVIDER_SITE_OTHER): Payer: Self-pay | Admitting: General Surgery

## 2020-07-28 ENCOUNTER — Encounter: Payer: Self-pay | Admitting: General Surgery

## 2020-07-28 ENCOUNTER — Other Ambulatory Visit: Payer: Self-pay

## 2020-07-28 VITALS — BP 120/75 | HR 66 | Temp 98.6°F | Resp 14 | Ht 59.5 in | Wt 169.0 lb

## 2020-07-28 DIAGNOSIS — Z09 Encounter for follow-up examination after completed treatment for conditions other than malignant neoplasm: Secondary | ICD-10-CM

## 2020-07-28 NOTE — Progress Notes (Signed)
Subjective:     Amanda Zamora  Here for postoperative visit, status post laparoscopic appendectomy.  She is still having some right sided abdominal pain, though this is easing.  She denies any nausea, vomiting, fever, or chills.  Her appetite is fine.  An interpreter was present during the visit. Objective:    BP 120/75   Pulse 66   Temp 98.6 F (37 C) (Other (Comment))   Resp 14   Ht 4' 11.5" (1.511 m)   Wt 169 lb (76.7 kg)   LMP 02/15/2011 (Approximate)   SpO2 97%   BMI 33.56 kg/m   General:  alert, cooperative, and no distress  Abdomen is soft with mild tenderness along the rectus muscle on the right side.  No hematoma is present.  Bowel sounds present.     Assessment:    Normal resolving postoperative pain.    Plan:   May use ibuprofen for pain relief.  As this is resolving, no need for further work-up at this time.  Patient understands and agrees.  Follow-up as needed.

## 2020-08-10 ENCOUNTER — Ambulatory Visit: Payer: Self-pay | Admitting: Physician Assistant

## 2020-08-10 DIAGNOSIS — E11319 Type 2 diabetes mellitus with unspecified diabetic retinopathy without macular edema: Secondary | ICD-10-CM

## 2020-08-10 DIAGNOSIS — Z20822 Contact with and (suspected) exposure to covid-19: Secondary | ICD-10-CM

## 2020-08-10 DIAGNOSIS — M25511 Pain in right shoulder: Secondary | ICD-10-CM

## 2020-08-10 DIAGNOSIS — F32A Depression, unspecified: Secondary | ICD-10-CM

## 2020-08-10 DIAGNOSIS — Z789 Other specified health status: Secondary | ICD-10-CM

## 2020-08-10 DIAGNOSIS — E1165 Type 2 diabetes mellitus with hyperglycemia: Secondary | ICD-10-CM

## 2020-08-10 NOTE — Progress Notes (Signed)
LMP 02/15/2011 (Approximate)    Subjective:    Patient ID: Amanda Zamora, female    DOB: Sep 04, 1960, 60 y.o.   MRN: [id number]  HPI: Amanda Zamora is a 60 y.o. female presenting on 08/10/2020 for No chief complaint on file.   HPI   Pt was scheduled for in-person appointment but called to request virtual appointment due to she wasn't feeling well.  This is a telemedicine appointment through [employer] due to coronavirus pandemic.  I connected with  Delorse Dagostino on 08/18/20 by a video enabled telemedicine application and verified that I am speaking with the correct person using two identifiers.   I discussed the limitations of evaluation and management by telemedicine. The patient expressed understanding and agreed to proceed.  Pt is at home.  Provider and translator are at office.    She had follow up with surgeon 6/14.  Her abdominal pain is getting better  Her depression is good.    She is having BMs.    Her shoulder is still hurting.    She is only using her insulin in the morning because her sugar is low.   She reports a lot of HA.  She is checking her bp at home and says it's been running high.    155 and 145 and 138  BS running around 120-160.  She is still seeing the eye doctor specialist.- she went last week and it was her last time.  That was doctor Amedeo Plenty.  She says has to pay $75.  She is not feeling well today.  She started feeling bad with HA -  and ST- she thinks it's because of the air conditioning.  She started feeling bad - ST started today.  The HA started since last week.  She hasn't taken a covid test.        Relevant past medical, surgical, family and social history reviewed and updated as indicated. Interim medical history since our last visit reviewed. Allergies and medications reviewed and updated.    Current Outpatient Medications:    diclofenac Sodium (VOLTAREN) 1 % GEL, Apply 2 g topically 4 (four) times daily., Disp: 20 g, Rfl: 0    dimenhyDRINATE (DRAMAMINE PO), Take by mouth. She is using the patch, Disp: , Rfl:    gabapentin (NEURONTIN) 300 MG capsule, Tome one capsule in am and lunchtime; then 2 capsules at bedtime. (Patient taking differently: Take 300-600 mg by mouth 2 (two) times daily. Take one capsule in am and lunchtime; then 2 capsules at bedtime.), Disp: 120 capsule, Rfl: 3   insulin NPH-regular Human (NOVOLIN 70/30 RELION) (70-30) 100 UNIT/ML injection, Inject 60 units SQ before breakfast and 40 units before evening meal.   Injecte 60 unidades subcutaneamente dos veces al dia con el alimento y 40 unidades antes de el alimento de la tarde, Disp: 10 mL, Rfl: 0   losartan (COZAAR) 100 MG tablet, Take 1 tablet (100 mg total) by mouth daily. Tome una tableta por boca diaria, Disp: 90 tablet, Rfl: 1   metFORMIN (GLUCOPHAGE) 1000 MG tablet, TAKE 1 TABLET BY MOUTH TWICE DAILY WITH A MEAL.   Tome una tableta por boca dos veces diarias con comida (Patient taking differently: Take 1,000 mg by mouth 2 (two) times daily with a meal.), Disp: 180 tablet, Rfl: 0   methocarbamol (ROBAXIN) 500 MG tablet, Take 1 tablet (500 mg total) by mouth every 8 (eight) hours as needed for muscle spasms. 1 tableta por la boca cada 8 horas cundo lo nececite  para Consulting civil engineer, Disp: 45 tablet, Rfl: 0   Omega-3 Fatty Acids (FISH OIL) 1000 MG CAPS, Take 1 capsule (1,000 mg total) by mouth in the morning and at bedtime., Disp: 60 capsule, Rfl: 2   rosuvastatin (CRESTOR) 20 MG tablet, Take 1 tablet (20 mg total) by mouth daily., Disp: 30 tablet, Rfl: 3   Docusate Sodium (STOOL SOFTENER LAXATIVE PO), Take by mouth. (Patient not taking: Reported on 08/10/2020), Disp: , Rfl:    omeprazole (PRILOSEC) 20 MG capsule, Take 1 capsule (20 mg total) by mouth daily. Tome una tableta por boca diaria (Patient not taking: Reported on 08/10/2020), Disp: 90 capsule, Rfl: 1   oxyCODONE (ROXICODONE) 5 MG immediate release tablet, Take 1 tablet (5 mg total) by mouth  every 6 (six) hours as needed for severe pain. (Patient not taking: No sig reported), Disp: 25 tablet, Rfl: 0     Review of Systems  Per HPI unless specifically indicated above     Objective:    LMP 02/15/2011 (Approximate)   Wt Readings from Last 3 Encounters:  07/28/20 169 lb (76.7 kg)  07/09/20 161 lb (73 kg)  07/05/20 168 lb 14 oz (76.6 kg)    Physical Exam Constitutional:      General: She is not in acute distress.    Appearance: She is not toxic-appearing.     Comments: Pt looks tired but otherwise looks herself  HENT:     Head: Normocephalic and atraumatic.  Pulmonary:     Effort: Pulmonary effort is normal. No respiratory distress.     Comments: Pt is talking in complete sentences without dyspnea Neurological:     Mental Status: She is alert and oriented to person, place, and time.  Psychiatric:        Behavior: Behavior normal.          Assessment & Plan:   Encounter Diagnoses  Name Primary?   Suspected COVID-19 virus infection Yes   Uncontrolled type 2 diabetes mellitus with hyperglycemia ([place])    Not proficient in English language    Depression, unspecified depression type    Right shoulder pain, unspecified chronicity    Diabetic retinopathy associated with type 2 diabetes mellitus, macular edema presence unspecified, unspecified laterality, unspecified retinopathy severity ([place])       -pt is encouraged to get covid test.  She is encouraged to self-isolate until she gets her results -pt is encouraged to monitor her blood sugars and take her meds as prescribed -she is encouraged to drink plenty of fluids and eat nutritious meals -pt to follow up in office in one month.  She is to contact office sooner prn

## 2020-09-08 ENCOUNTER — Ambulatory Visit: Payer: Self-pay | Admitting: Physician Assistant

## 2020-09-14 ENCOUNTER — Other Ambulatory Visit: Payer: Self-pay | Admitting: Physician Assistant

## 2020-09-16 ENCOUNTER — Other Ambulatory Visit: Payer: Self-pay | Admitting: Physician Assistant

## 2020-09-16 ENCOUNTER — Encounter: Payer: Self-pay | Admitting: Physician Assistant

## 2020-09-16 ENCOUNTER — Ambulatory Visit: Payer: Self-pay | Admitting: Physician Assistant

## 2020-09-16 VITALS — BP 142/70 | HR 61 | Temp 98.1°F | Wt 168.0 lb

## 2020-09-16 DIAGNOSIS — H3321 Serous retinal detachment, right eye: Secondary | ICD-10-CM

## 2020-09-16 DIAGNOSIS — E11319 Type 2 diabetes mellitus with unspecified diabetic retinopathy without macular edema: Secondary | ICD-10-CM

## 2020-09-16 DIAGNOSIS — E785 Hyperlipidemia, unspecified: Secondary | ICD-10-CM

## 2020-09-16 DIAGNOSIS — Z789 Other specified health status: Secondary | ICD-10-CM

## 2020-09-16 DIAGNOSIS — M25511 Pain in right shoulder: Secondary | ICD-10-CM

## 2020-09-16 DIAGNOSIS — I10 Essential (primary) hypertension: Secondary | ICD-10-CM

## 2020-09-16 DIAGNOSIS — E1165 Type 2 diabetes mellitus with hyperglycemia: Secondary | ICD-10-CM

## 2020-09-16 MED ORDER — AMLODIPINE BESYLATE 5 MG PO TABS: 5.0000 mg | ORAL_TABLET | Freq: Every day | ORAL | 1 refills | Status: DC

## 2020-09-16 MED ORDER — METFORMIN HCL 1000 MG PO TABS: ORAL_TABLET | ORAL | 0 refills | Status: DC

## 2020-09-16 MED ORDER — LOSARTAN POTASSIUM 100 MG PO TABS: 100.0000 mg | ORAL_TABLET | Freq: Every day | ORAL | 1 refills | Status: DC

## 2020-09-16 NOTE — Progress Notes (Signed)
BP (!) 142/70   Pulse 61   Temp 98.1 F (36.7 C)   Wt 168 lb (76.2 kg)   LMP 02/15/2011 (Approximate)   SpO2 97%   BMI 33.36 kg/m    Subjective:    Patient ID: Amanda Zamora, female    DOB: 01-04-1961, 61 y.o.   MRN: [id number]  HPI: Amanda Zamora is a 60 y.o. female presenting on 09/16/2020 for Hypertension and Shoulder Pain   HPI  Pt had a negative covid 19 screening questionnaire.   Chief Complaint  Patient presents with   Hypertension   Shoulder Pain     Pt is having problems sleeping due to pain in her shoulder.  She is using 70u insulin in the morning and 20 ro 30 in the evening.  She doesn't have a bs log; she says it runs 180s- this morning 183.  Yesterday 238 around 2pm- that was after she ate lunch around 1pm.  She has had no lows in her blood sugars.   She is no longer seeing the eye doctor.   She stopped going due to having to pay $75 every visit.  She had laser on the left eye.   No treatments to the right eye.   She says she can see nothing out of the right eye except for little spots and a little bit of light.    She says she is no longer having any depression.   Her daughter is living with her.  Her abd pain is doing good.  She is moving her bowels regularly.  Her right shoulder is still hurting.   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   She did not have further work up at that time due to other more pressing issues like her abdominal pain.     Relevant past medical, surgical, family and social history reviewed and updated as indicated. Interim medical history since our last visit reviewed. Allergies and medications reviewed and updated.   Current Outpatient Medications:    diclofenac Sodium (VOLTAREN) 1 % GEL, Apply 2 g topically 4 (four) times daily., Disp: 20 g, Rfl: 0   gabapentin (NEURONTIN) 300 MG capsule, Tome one capsule in am and lunchtime; then 2 capsules at bedtime., Disp: 120 capsule, Rfl: 3   insulin NPH-regular Human  (NOVOLIN 70/30 RELION) (70-30) 100 UNIT/ML injection, Inject 60 units SQ before breakfast and 40 units before evening meal.   Injecte 60 unidades subcutaneamente dos veces al dia con el alimento y 40 unidades antes de el alimento de la tarde, Disp: 10 mL, Rfl: 0   losartan (COZAAR) 100 MG tablet, Take 1 tablet (100 mg total) by mouth daily. Tome una tableta por boca diaria, Disp: 90 tablet, Rfl: 1   metFORMIN (GLUCOPHAGE) 1000 MG tablet, TAKE 1 TABLET BY MOUTH TWICE DAILY WITH A MEAL.   Tome una tableta por boca dos veces diarias con comida (Patient taking differently: Take 1,000 mg by mouth 2 (two) times daily with a meal.), Disp: 180 tablet, Rfl: 0   methocarbamol (ROBAXIN) 500 MG tablet, TAKE 1 TABLET BY MOUTH EVERY 8 HOURS AS NEEDED FOR MUSCLE SPASM, Disp: 45 tablet, Rfl: 0   Omega-3 Fatty Acids (FISH OIL) 1000 MG CAPS, Take 1 capsule (1,000 mg total) by mouth in the morning and at bedtime., Disp: 60 capsule, Rfl: 2   rosuvastatin (CRESTOR) 20 MG tablet, Take 1 tablet (20 mg total) by mouth daily., Disp: 30 tablet, Rfl: 3   dimenhyDRINATE (DRAMAMINE  PO), Take by mouth. She is using the patch (Patient not taking: Reported on 09/16/2020), Disp: , Rfl:    Docusate Sodium (STOOL SOFTENER LAXATIVE PO), Take by mouth. (Patient not taking: No sig reported), Disp: , Rfl:    omeprazole (PRILOSEC) 20 MG capsule, Take 1 capsule (20 mg total) by mouth daily. Tome una tableta por boca diaria (Patient not taking: No sig reported), Disp: 90 capsule, Rfl: 1    Review of Systems  Per HPI unless specifically indicated above     Objective:    BP (!) 142/70   Pulse 61   Temp 98.1 F (36.7 C)   Wt 168 lb (76.2 kg)   LMP 02/15/2011 (Approximate)   SpO2 97%   BMI 33.36 kg/m   Wt Readings from Last 3 Encounters:  09/16/20 168 lb (76.2 kg)  07/28/20 169 lb (76.7 kg)  07/09/20 161 lb (73 kg)    Physical Exam Vitals reviewed.  Constitutional:      General: She is not in acute distress.    Appearance: She  is well-developed. She is obese. She is not toxic-appearing.  HENT:     Head: Normocephalic and atraumatic.  Cardiovascular:     Rate and Rhythm: Normal rate and regular rhythm.  Pulmonary:     Effort: Pulmonary effort is normal.     Breath sounds: Normal breath sounds.  Abdominal:     General: Bowel sounds are normal.     Palpations: Abdomen is soft. There is no mass.     Tenderness: There is no abdominal tenderness.  Musculoskeletal:     Right shoulder: Tenderness present. Decreased range of motion. Normal pulse.     Cervical back: Neck supple.     Right lower leg: No edema.     Left lower leg: No edema.  Lymphadenopathy:     Cervical: No cervical adenopathy.  Skin:    General: Skin is warm and dry.  Neurological:     Mental Status: She is alert and oriented to person, place, and time.  Psychiatric:        Attention and Perception: Attention normal.        Mood and Affect: Mood normal.        Speech: Speech normal.        Behavior: Behavior normal. Behavior is cooperative.          Assessment & Plan:    Encounter Diagnoses  Name Primary?   Essential hypertension Yes   Right shoulder pain, unspecified chronicity    Right retinal detachment    Diabetic retinopathy associated with type 2 diabetes mellitus, macular edema presence unspecified, unspecified laterality, unspecified retinopathy severity ([place])    Uncontrolled type 2 diabetes mellitus with hyperglycemia ([facility])    Hyperlipidemia, unspecified hyperlipidemia type    Not proficient in [place] language       -Will try to get pt to eye dr at [facility] since the one she saw earlier this year was charging her and she couldn't afford it.  She will work with [facility] to get [place] assistance.   -refer To orthopedist for the right shoulder.  She thinks her [employer] financial assistance is not active.   She is given application.  She is instructed to work with [facility] to get approved -will Add  amlodipine for the HTN.  She is to continue losartan -increase Insulin to 60/35 units.  Monitor bs -follow up 1 month with bs log and will recheck bp.  Pt  is to contact office sooner prn

## 2020-09-17 ENCOUNTER — Encounter: Payer: Self-pay | Admitting: Physician Assistant

## 2020-10-08 ENCOUNTER — Other Ambulatory Visit: Payer: Self-pay | Admitting: Physician Assistant

## 2020-10-08 DIAGNOSIS — E785 Hyperlipidemia, unspecified: Secondary | ICD-10-CM

## 2020-10-08 DIAGNOSIS — E1165 Type 2 diabetes mellitus with hyperglycemia: Secondary | ICD-10-CM

## 2020-10-08 DIAGNOSIS — D649 Anemia, unspecified: Secondary | ICD-10-CM

## 2020-10-08 DIAGNOSIS — I10 Essential (primary) hypertension: Secondary | ICD-10-CM

## 2020-10-16 ENCOUNTER — Other Ambulatory Visit (HOSPITAL_COMMUNITY)
Admission: RE | Admit: 2020-10-16 | Discharge: 2020-10-16 | Disposition: A | Discharge status: Home / Self Care | Payer: Self-pay | Source: Ambulatory Visit | Attending: Physician Assistant | Admitting: Physician Assistant

## 2020-10-16 ENCOUNTER — Other Ambulatory Visit: Payer: Self-pay

## 2020-10-16 DIAGNOSIS — I10 Essential (primary) hypertension: Secondary | ICD-10-CM | POA: Insufficient documentation

## 2020-10-16 DIAGNOSIS — E1165 Type 2 diabetes mellitus with hyperglycemia: Secondary | ICD-10-CM | POA: Insufficient documentation

## 2020-10-16 DIAGNOSIS — E785 Hyperlipidemia, unspecified: Secondary | ICD-10-CM | POA: Insufficient documentation

## 2020-10-16 DIAGNOSIS — D649 Anemia, unspecified: Secondary | ICD-10-CM | POA: Insufficient documentation

## 2020-10-16 LAB — CBC
HCT: 37.7 % (ref 36.0–46.0)
Hemoglobin: 12.1 g/dL (ref 12.0–15.0)
MCH: 28.7 pg (ref 26.0–34.0)
MCHC: 32.1 g/dL (ref 30.0–36.0)
MCV: 89.5 fL (ref 80.0–100.0)
Platelets: 229 10*3/uL (ref 150–400)
RBC: 4.21 MIL/uL (ref 3.87–5.11)
RDW: 13.9 % (ref 11.5–15.5)
WBC: 6.7 10*3/uL (ref 4.0–10.5)
nRBC: 0 % (ref 0.0–0.2)

## 2020-10-16 LAB — LIPID PANEL
Cholesterol: 113 mg/dL (ref 0–200)
HDL: 37 mg/dL — ABNORMAL LOW (ref >40)
LDL Cholesterol: 36 mg/dL (ref 0–99)
Total CHOL/HDL Ratio: 3.1 RATIO
Triglycerides: 199 mg/dL — ABNORMAL HIGH (ref <150)
VLDL: 40 mg/dL (ref 0–40)

## 2020-10-16 LAB — COMPREHENSIVE METABOLIC PANEL
ALT: 19 U/L (ref 0–44)
AST: 15 U/L (ref 15–41)
Albumin: 4 g/dL (ref 3.5–5.0)
Alkaline Phosphatase: 85 U/L (ref 38–126)
Anion gap: 10 (ref 5–15)
BUN: 17 mg/dL (ref 6–20)
CO2: 27 mmol/L (ref 22–32)
Calcium: 9.3 mg/dL (ref 8.9–10.3)
Chloride: 99 mmol/L (ref 98–111)
Creatinine, Ser: 0.75 mg/dL (ref 0.44–1.00)
GFR, Estimated: >60 mL/min (ref >60)
Glucose, Bld: 267 mg/dL — ABNORMAL HIGH (ref 70–99)
Potassium: 4.3 mmol/L (ref 3.5–5.1)
Sodium: 136 mmol/L (ref 135–145)
Total Bilirubin: 0.7 mg/dL (ref 0.3–1.2)
Total Protein: 7.5 g/dL (ref 6.5–8.1)

## 2020-10-16 LAB — HEMOGLOBIN A1C
Hgb A1c MFr Bld: 10.3 % — ABNORMAL HIGH (ref 4.8–5.6)
Mean Plasma Glucose: 248.91 mg/dL

## 2020-10-17 LAB — MICROALBUMIN, URINE: Microalb, Ur: 3.3 ug/mL — ABNORMAL HIGH

## 2020-10-20 ENCOUNTER — Other Ambulatory Visit: Payer: Self-pay

## 2020-10-20 ENCOUNTER — Ambulatory Visit: Payer: Self-pay | Admitting: Physician Assistant

## 2020-10-20 ENCOUNTER — Encounter: Payer: Self-pay | Admitting: Physician Assistant

## 2020-10-20 VITALS — BP 142/65 | HR 63 | Temp 97.6°F | Wt 167.0 lb

## 2020-10-20 DIAGNOSIS — Z789 Other specified health status: Secondary | ICD-10-CM

## 2020-10-20 DIAGNOSIS — E11319 Type 2 diabetes mellitus with unspecified diabetic retinopathy without macular edema: Secondary | ICD-10-CM

## 2020-10-20 DIAGNOSIS — E1165 Type 2 diabetes mellitus with hyperglycemia: Secondary | ICD-10-CM

## 2020-10-20 DIAGNOSIS — E785 Hyperlipidemia, unspecified: Secondary | ICD-10-CM

## 2020-10-20 DIAGNOSIS — M25511 Pain in right shoulder: Secondary | ICD-10-CM

## 2020-10-20 DIAGNOSIS — I10 Essential (primary) hypertension: Secondary | ICD-10-CM

## 2020-10-20 MED ORDER — AMLODIPINE BESYLATE 10 MG PO TABS: 10.0000 mg | ORAL_TABLET | Freq: Every day | ORAL | 1 refills | Status: DC

## 2020-10-20 NOTE — Progress Notes (Signed)
BP (!) 142/65   Pulse 63   Temp 97.6 F (36.4 C)   Wt 167 lb (75.8 kg)   LMP 02/15/2011 (Approximate)   SpO2 98%   BMI 33.17 kg/m    Subjective:    Patient ID: Amanda Zamora, female    DOB: [birth year], 60 y.o.   MRN: [id number]  HPI: Amanda Zamora is a 60 y.o. female presenting on 10/20/2020 for Hypertension and Diabetes   HPI   Pt had a negative covid 19 screening questionnaire.   Chief Complaint  Patient presents with   Hypertension   Diabetes    Pt has Eye appointment 10/30/20 at [facility]  She reports Having a lot of cramps In bilateral LE. When she rested they went away.    Amlodipine added at last appointment  Pt is using Insulin 60/35  She says her shoulder is still hurting a lot.  She is ready to go to orthopedics for it.   Xray was done in March that showed mild OA and a spur.         Relevant past medical, surgical, family and social history reviewed and updated as indicated. Interim medical history since our last visit reviewed. Allergies and medications reviewed and updated.   Current Outpatient Medications:    amLODipine (NORVASC) 10 MG tablet, Take 1 tablet (10 mg total) by mouth daily., Disp: 90 tablet, Rfl: 1   atorvastatin (LIPITOR) 80 MG tablet, TAKE 1 Tablet BY MOUTH ONCE EVERY DAY, Disp: 90 tablet, Rfl: 1   diclofenac Sodium (VOLTAREN) 1 % GEL, Apply 2 g topically 4 (four) times daily., Disp: 20 g, Rfl: 0   dimenhyDRINATE (DRAMAMINE PO), Take by mouth. She is using the patch, Disp: , Rfl:    gabapentin (NEURONTIN) 300 MG capsule, Tome one capsule in am and lunchtime; then 2 capsules at bedtime., Disp: 120 capsule, Rfl: 3   insulin NPH-regular Human (NOVOLIN 70/30 RELION) (70-30) 100 UNIT/ML injection, Inject 60 units SQ before breakfast and 40 units before evening meal.   Injecte 60 unidades subcutaneamente dos veces al dia con el alimento y 40 unidades antes de el alimento de la tarde, Disp: 10 mL, Rfl: 0   losartan (COZAAR) 100 MG tablet,  Take 1 tablet (100 mg total) by mouth daily. Tome una tableta por boca diaria, Disp: 90 tablet, Rfl: 1   metFORMIN (GLUCOPHAGE) 1000 MG tablet, TAKE 1 TABLET BY MOUTH TWICE DAILY WITH A MEAL.   Tome una tableta por boca dos veces diarias con comida, Disp: 180 tablet, Rfl: 0   methocarbamol (ROBAXIN) 500 MG tablet, TAKE 1 TABLET BY MOUTH EVERY 8 HOURS AS NEEDED FOR MUSCLE SPASM, Disp: 45 tablet, Rfl: 0   Omega-3 Fatty Acids (FISH OIL) 1000 MG CAPS, Take 1 capsule (1,000 mg total) by mouth in the morning and at bedtime., Disp: 60 capsule, Rfl: 2   omeprazole (PRILOSEC) 20 MG capsule, Take 1 capsule (20 mg total) by mouth daily. Tome una tableta por boca diaria, Disp: 90 capsule, Rfl: 1   Docusate Sodium (STOOL SOFTENER LAXATIVE PO), Take by mouth. (Patient not taking: No sig reported), Disp: , Rfl:     Review of Systems  Per HPI unless specifically indicated above     Objective:    BP (!) 142/65   Pulse 63   Temp 97.6 F (36.4 C)   Wt 167 lb (75.8 kg)   LMP 02/15/2011 (Approximate)   SpO2 98%   BMI 33.17 kg/m   Wt Readings from Last 3  Encounters:  10/20/20 167 lb (75.8 kg)  09/16/20 168 lb (76.2 kg)  07/28/20 169 lb (76.7 kg)    Physical Exam Vitals reviewed.  Constitutional:      General: She is not in acute distress.    Appearance: She is well-developed. She is not toxic-appearing.  HENT:     Head: Normocephalic and atraumatic.  Cardiovascular:     Rate and Rhythm: Normal rate and regular rhythm.  Pulmonary:     Effort: Pulmonary effort is normal.     Breath sounds: Normal breath sounds.  Abdominal:     General: Bowel sounds are normal.     Palpations: Abdomen is soft. There is no mass.     Tenderness: There is no abdominal tenderness.  Musculoskeletal:     Right shoulder: Tenderness present. Normal range of motion.     Cervical back: Neck supple.     Right lower leg: No edema.     Left lower leg: No edema.     Comments: Pain with ROM right shoulder.  No point  tenderness.    Lymphadenopathy:     Cervical: No cervical adenopathy.  Skin:    General: Skin is warm and dry.  Neurological:     Mental Status: She is alert and oriented to person, place, and time.  Psychiatric:        Behavior: Behavior normal.    Results for orders placed or performed during the hospital encounter of 10/16/20  Microalbumin, urine  Result Value Ref Range   Microalb, Ur 3.3 (H) Not Estab. ug/mL  Lipid panel  Result Value Ref Range   Cholesterol 113 0 - 200 mg/dL   Triglycerides 199 (H) <150 mg/dL   HDL 37 (L) >40 mg/dL   Total CHOL/HDL Ratio 3.1 RATIO   VLDL 40 0 - 40 mg/dL   LDL Cholesterol 36 0 - 99 mg/dL  Comprehensive metabolic panel  Result Value Ref Range   Sodium 136 135 - 145 mmol/L   Potassium 4.3 3.5 - 5.1 mmol/L   Chloride 99 98 - 111 mmol/L   CO2 27 22 - 32 mmol/L   Glucose, Bld 267 (H) 70 - 99 mg/dL   BUN 17 6 - 20 mg/dL   Creatinine, Ser 0.75 0.44 - 1.00 mg/dL   Calcium 9.3 8.9 - 10.3 mg/dL   Total Protein 7.5 6.5 - 8.1 g/dL   Albumin 4.0 3.5 - 5.0 g/dL   AST 15 15 - 41 U/L   ALT 19 0 - 44 U/L   Alkaline Phosphatase 85 38 - 126 U/L   Total Bilirubin 0.7 0.3 - 1.2 mg/dL   GFR, Estimated >60 >60 mL/min   Anion gap 10 5 - 15  CBC  Result Value Ref Range   WBC 6.7 4.0 - 10.5 K/uL   RBC 4.21 3.87 - 5.11 MIL/uL   Hemoglobin 12.1 12.0 - 15.0 g/dL   HCT 37.7 36.0 - 46.0 %   MCV 89.5 80.0 - 100.0 fL   MCH 28.7 26.0 - 34.0 pg   MCHC 32.1 30.0 - 36.0 g/dL   RDW 13.9 11.5 - 15.5 %   Platelets 229 150 - 400 K/uL   nRBC 0.0 0.0 - 0.2 %  Hemoglobin A1c  Result Value Ref Range   Hgb A1c MFr Bld 10.3 (H) 4.8 - 5.6 %   Mean Plasma Glucose 248.91 mg/dL      Assessment & Plan:   Encounter Diagnoses  Name Primary?   Uncontrolled type 2 diabetes mellitus  with hyperglycemia ([place]) Yes   Essential hypertension    Hyperlipidemia, unspecified hyperlipidemia type    Right shoulder pain, unspecified chronicity    Diabetic retinopathy associated  with type 2 diabetes mellitus, macular edema presence unspecified, unspecified laterality, unspecified retinopathy severity ([place])    Not proficient in English language       Uncontrolled DM -reviewed labs with pt.  BS log reviewed.   Discussed with pt that readings are all over the place.  Pt is counseled to watch diabetic diet.  She says she know what she should be eating and declined referral to RD.   Pt to Increase insulin to 65/35.  DM foot exam updated.  Pt is to monitor bs and contact office sooner for fbs < 70 or > 300.     HTN -will Increase amlodipine to 10 mg.   Pt to continue her losartan  Dyslipidemia -pt to continue atorvastatin and lowfat diet  Anemia -Resolved  HCM: -Pt was given FIT test for colon cancer screening  Shoulder pain -Refer to orthopedics for shoulder  Retinopathy -pt to follow up with [facility] eye doctor as scheduled    -Pt is encouraged to contact [employer] to check on on financial assistance -Pt to follow up 3 months.  She is to contact office sooner prn

## 2020-10-29 ENCOUNTER — Other Ambulatory Visit: Payer: Self-pay | Admitting: Physician Assistant

## 2020-10-29 DIAGNOSIS — Z1211 Encounter for screening for malignant neoplasm of colon: Secondary | ICD-10-CM

## 2020-10-29 LAB — IFOBT (OCCULT BLOOD): IFOBT: NEGATIVE

## 2020-11-04 ENCOUNTER — Other Ambulatory Visit: Payer: Self-pay | Admitting: Physician Assistant

## 2020-11-10 ENCOUNTER — Ambulatory Visit: Payer: Self-pay | Admitting: Orthopedic Surgery

## 2020-11-23 ENCOUNTER — Telehealth: Payer: Self-pay

## 2020-11-25 ENCOUNTER — Other Ambulatory Visit: Payer: Self-pay

## 2020-11-25 ENCOUNTER — Ambulatory Visit (INDEPENDENT_AMBULATORY_CARE_PROVIDER_SITE_OTHER): Payer: Self-pay | Admitting: Orthopedic Surgery

## 2020-11-25 VITALS — BP 126/66 | HR 78 | Ht 59.5 in | Wt 166.0 lb

## 2020-11-25 DIAGNOSIS — M19011 Primary osteoarthritis, right shoulder: Secondary | ICD-10-CM

## 2020-11-25 MED ORDER — CYCLOBENZAPRINE HCL 10 MG PO TABS: 10.0000 mg | ORAL_TABLET | Freq: Two times a day (BID) | ORAL | 0 refills | Status: DC | PRN

## 2020-11-25 NOTE — Progress Notes (Signed)
New Patient Visit  Assessment: Amanda Zamora is a 60 y.o. female with the following: 1. Arthritis of right glenohumeral joint  Plan: Patient has pain in the right shoulder, with atraumatic onset.  It has gotten worse over the next 3 months.  She notes a lot of pain, muscle spasm and stiffness in her right trapezius and into her neck.  Review of the radiographs demonstrates no acute injury.  She does have some degenerative changes at the joint.  We discussed multiple treatment options, and she is elected proceed with a subacromial steroid injection.  Depending on the efficacy of this injection, we may consider a glenohumeral joint injection.  The pain in her neck and trapezius musculature, I recommended Flexeril.  Procedure note injection - Right shoulder    Verbal consent was obtained to inject the right shoulder, subacromial space Timeout was completed to confirm the site of injection.   The skin was prepped with alcohol and ethyl chloride was sprayed at the injection site.  A 21-gauge needle was used to inject 40 mg of Depo-Medrol and 1% lidocaine (3 cc) into the subacromial space of the right shoulder using a posterolateral approach.  There were no complications.  A sterile bandage was applied.    Follow-up: Return if symptoms worsen or fail to improve.  Subjective:  Chief Complaint  Patient presents with   Shoulder Pain    Rt shoulder pain for 3 months, NKI, staying the same since started.     History of Present Illness: Amanda Zamora is a 60 y.o. female who has been referred to clinic today by Soyla Dryer, PA-C for evaluation of right shoulder pain.  She has had pain for the past 4-5 months.  She has difficulty with overhead motion.  No specific injury.  She is taking over-the-counter pain medications with limited improvement in her symptoms.  She has not worked with physical therapy.  No previous injections.  She also has a lot of pain in the superior aspect of the  shoulder, including the muscle surrounding the neck on the right side.  Pain gets worse at night.  She does have some radiating pains distally.  Patient was evaluated in clinic today with the assistance of a Spanish interpreter.   Review of Systems: No fevers or chills No numbness or tingling No chest pain No shortness of breath No bowel or bladder dysfunction No GI distress No headaches   Medical History:  Past Medical History:  Diagnosis Date   Detached retina    GERD (gastroesophageal reflux disease)    History of pyelonephritis    PONV (postoperative nausea and vomiting)    Type 2 diabetes mellitus ([place])     Past Surgical History:  Procedure Laterality Date   CHOLECYSTECTOMY N/A 12/23/2019   Procedure: LAPAROSCOPIC CHOLECYSTECTOMY;  Surgeon: Virl Cagey, MD;  Location: [facility];  Service: General;  Laterality: N/A;   EYE SURGERY Right    retina   LAPAROSCOPIC APPENDECTOMY N/A 07/05/2020   Procedure: APPENDECTOMY LAPAROSCOPIC;  Surgeon: Aviva Signs, MD;  Location: [facility];  Service: General;  Laterality: N/A;   TUBAL LIGATION      Family History  Problem Relation Age of Onset   Breast cancer Sister    Diabetes Sister    Social History   Tobacco Use   Smoking status: Never   Smokeless tobacco: Never  Vaping Use   Vaping Use: Never used  Substance Use Topics   Alcohol use: Not Currently    Comment: previously would  drink occ. beer last drink 06/2017   Drug use: No    No Known Allergies  Current Meds  Medication Sig   amLODipine (NORVASC) 10 MG tablet Take 1 tablet (10 mg total) by mouth daily.   atorvastatin (LIPITOR) 80 MG tablet TAKE 1 Tablet BY MOUTH ONCE EVERY DAY   cyclobenzaprine (FLEXERIL) 10 MG tablet Take 1 tablet (10 mg total) by mouth 2 (two) times daily as needed for muscle spasms.   diclofenac Sodium (VOLTAREN) 1 % GEL Apply 2 g topically 4 (four) times daily.   dimenhyDRINATE (DRAMAMINE PO) Take by mouth. She is using the patch    gabapentin (NEURONTIN) 300 MG capsule Tome one capsule in am and lunchtime; then 2 capsules at bedtime.   insulin NPH-regular Human (NOVOLIN 70/30 RELION) (70-30) 100 UNIT/ML injection Inject 60 units SQ before breakfast and 40 units before evening meal.   Injecte 60 unidades subcutaneamente dos veces al dia con el alimento y 40 unidades antes de el alimento de la tarde   losartan (COZAAR) 100 MG tablet Take 1 tablet (100 mg total) by mouth daily. Tome una tableta por boca diaria   metFORMIN (GLUCOPHAGE) 1000 MG tablet TAKE 1 TABLET BY MOUTH TWICE DAILY WITH A MEAL.   Tome una tableta por boca dos veces diarias con comida   Omega-3 Fatty Acids (FISH OIL) 1000 MG CAPS Take 1 capsule (1,000 mg total) by mouth in the morning and at bedtime.   omeprazole (PRILOSEC) 20 MG capsule Take 1 capsule (20 mg total) by mouth daily. Tome una tableta por boca diaria   [DISCONTINUED] methocarbamol (ROBAXIN) 500 MG tablet TAKE 1 TABLET BY MOUTH EVERY 8 HOURS AS NEEDED FOR MUSCLE SPASM    Objective: BP 126/66   Pulse 78   Ht 4' 11.5" (1.511 m)   Wt 166 lb (75.3 kg)   LMP 02/15/2011 (Approximate)   BMI 32.97 kg/m   Physical Exam:  General: Alert and oriented. and No acute distress. Gait: Normal gait.  Poor dentition overall.  Evaluation of right shoulder demonstrates no deformity.  She has tenderness to palpation within the trapezius and right-sided neck muscles.  Pain with overhead motion.  Forward flexion limited to approximately 100 degrees.  Abduction at her side to 80 degrees.  Internal rotation to the side of her hip.  Negative belly press.  4+/5 strength in the infraspinatus.  Pain in the empty can testing position.   IMAGING: I personally reviewed images previously obtained in clinic  X-ray of the right shoulder was previously obtained in clinic, in March, 2022.  This demonstrates mild degenerative change of the glenohumeral joint which includes loss of joint space, as well as some inferior  osteophytes off the glenoid, as well as the inferior humeral head.  New Medications:  Meds ordered this encounter  Medications   cyclobenzaprine (FLEXERIL) 10 MG tablet    Sig: Take 1 tablet (10 mg total) by mouth 2 (two) times daily as needed for muscle spasms.    Dispense:  20 tablet    Refill:  0      Mordecai Rasmussen, MD  11/26/2020 9:40 AM

## 2020-11-25 NOTE — Patient Instructions (Signed)

## 2020-11-26 ENCOUNTER — Encounter: Payer: Self-pay | Admitting: Orthopedic Surgery

## 2020-12-30 ENCOUNTER — Other Ambulatory Visit: Payer: Self-pay | Admitting: Orthopedic Surgery

## 2021-01-04 ENCOUNTER — Other Ambulatory Visit: Payer: Self-pay | Admitting: Physician Assistant

## 2021-01-04 DIAGNOSIS — I10 Essential (primary) hypertension: Secondary | ICD-10-CM

## 2021-01-04 DIAGNOSIS — E1165 Type 2 diabetes mellitus with hyperglycemia: Secondary | ICD-10-CM

## 2021-01-04 DIAGNOSIS — E785 Hyperlipidemia, unspecified: Secondary | ICD-10-CM

## 2021-01-19 ENCOUNTER — Ambulatory Visit: Payer: Self-pay | Admitting: Physician Assistant

## 2021-03-08 ENCOUNTER — Other Ambulatory Visit: Payer: Self-pay | Admitting: Physician Assistant

## 2021-03-08 ENCOUNTER — Other Ambulatory Visit: Payer: Self-pay | Admitting: Orthopedic Surgery

## 2021-03-29 ENCOUNTER — Other Ambulatory Visit: Payer: Self-pay | Admitting: Physician Assistant

## 2021-03-29 MED ORDER — ATORVASTATIN CALCIUM 80 MG PO TABS: ORAL_TABLET | ORAL | 0 refills | Status: DC

## 2021-03-29 MED ORDER — AMLODIPINE BESYLATE 10 MG PO TABS: 10.0000 mg | ORAL_TABLET | Freq: Every day | ORAL | 0 refills | Status: DC

## 2021-03-29 MED ORDER — LOSARTAN POTASSIUM 100 MG PO TABS: 100.0000 mg | ORAL_TABLET | Freq: Every day | ORAL | 0 refills | Status: DC

## 2021-03-29 MED ORDER — METFORMIN HCL 1000 MG PO TABS: ORAL_TABLET | ORAL | 0 refills | Status: DC

## 2021-04-02 ENCOUNTER — Other Ambulatory Visit (HOSPITAL_COMMUNITY)
Admission: RE | Admit: 2021-04-02 | Discharge: 2021-04-02 | Disposition: A | Discharge status: Home / Self Care | Payer: Self-pay | Source: Ambulatory Visit | Attending: Physician Assistant | Admitting: Physician Assistant

## 2021-04-02 DIAGNOSIS — E1165 Type 2 diabetes mellitus with hyperglycemia: Secondary | ICD-10-CM | POA: Insufficient documentation

## 2021-04-02 DIAGNOSIS — I10 Essential (primary) hypertension: Secondary | ICD-10-CM | POA: Insufficient documentation

## 2021-04-02 DIAGNOSIS — E785 Hyperlipidemia, unspecified: Secondary | ICD-10-CM | POA: Insufficient documentation

## 2021-04-02 LAB — LIPID PANEL
Cholesterol: 119 mg/dL (ref 0–200)
HDL: 38 mg/dL — ABNORMAL LOW (ref >40)
LDL Cholesterol: 45 mg/dL (ref 0–99)
Total CHOL/HDL Ratio: 3.1 RATIO
Triglycerides: 179 mg/dL — ABNORMAL HIGH (ref <150)
VLDL: 36 mg/dL (ref 0–40)

## 2021-04-02 LAB — COMPREHENSIVE METABOLIC PANEL
ALT: 16 U/L (ref 0–44)
AST: 16 U/L (ref 15–41)
Albumin: 3.9 g/dL (ref 3.5–5.0)
Alkaline Phosphatase: 101 U/L (ref 38–126)
Anion gap: 10 (ref 5–15)
BUN: 16 mg/dL (ref 6–20)
CO2: 24 mmol/L (ref 22–32)
Calcium: 8.9 mg/dL (ref 8.9–10.3)
Chloride: 101 mmol/L (ref 98–111)
Creatinine, Ser: 0.78 mg/dL (ref 0.44–1.00)
GFR, Estimated: >60 mL/min (ref >60)
Glucose, Bld: 240 mg/dL — ABNORMAL HIGH (ref 70–99)
Potassium: 4.4 mmol/L (ref 3.5–5.1)
Sodium: 135 mmol/L (ref 135–145)
Total Bilirubin: 0.9 mg/dL (ref 0.3–1.2)
Total Protein: 7.6 g/dL (ref 6.5–8.1)

## 2021-04-02 LAB — HEMOGLOBIN A1C
Hgb A1c MFr Bld: 9.5 % — ABNORMAL HIGH (ref 4.8–5.6)
Mean Plasma Glucose: 225.95 mg/dL

## 2021-04-06 ENCOUNTER — Ambulatory Visit: Payer: Self-pay | Admitting: Physician Assistant

## 2021-04-06 ENCOUNTER — Other Ambulatory Visit: Payer: Self-pay

## 2021-04-06 ENCOUNTER — Encounter: Payer: Self-pay | Admitting: Physician Assistant

## 2021-04-06 VITALS — BP 127/73 | HR 73 | Temp 96.5°F | Wt 165.0 lb

## 2021-04-06 DIAGNOSIS — Z789 Other specified health status: Secondary | ICD-10-CM

## 2021-04-06 DIAGNOSIS — I10 Essential (primary) hypertension: Secondary | ICD-10-CM

## 2021-04-06 DIAGNOSIS — E785 Hyperlipidemia, unspecified: Secondary | ICD-10-CM

## 2021-04-06 DIAGNOSIS — E11319 Type 2 diabetes mellitus with unspecified diabetic retinopathy without macular edema: Secondary | ICD-10-CM

## 2021-04-06 DIAGNOSIS — E1165 Type 2 diabetes mellitus with hyperglycemia: Secondary | ICD-10-CM

## 2021-04-06 MED ORDER — CYCLOBENZAPRINE HCL 10 MG PO TABS: ORAL_TABLET | ORAL | 0 refills | Status: DC

## 2021-04-06 MED ORDER — NOVOLIN 70/30 RELION (70-30) 100 UNIT/ML ~~LOC~~ SUSP: SUBCUTANEOUS | 0 refills | Status: DC

## 2021-04-06 NOTE — Progress Notes (Signed)
BP 127/73    Pulse 73    Temp (!) 96.5 F (35.8 C)    Wt 165 lb (74.8 kg)    LMP 02/15/2011 (Approximate)    SpO2 98%    BMI 32.77 kg/m    Subjective:    Patient ID: Amanda Zamora, female    DOB: Jan 31, 1961, 61 y.o.   MRN: [id number]  HPI: Amanda Zamora is a 61 y.o. female presenting on 04/06/2021 for Follow-up   HPI   43yoF with DM and HTN who hasn't been into the office since 10/20/20.    She cancelled her appointment in December  She had surgery on the OS and can see okay.  She says she can only see a little bit with the right eye but says they aren't planning any treatment for that eye.  They are going to see her for follow up at retina clinic on march 1.    She saw orthopedist for shoulder in October.  She got injection.  The shoulder is doing better.    She is using the flexeril prn usually once every 2 days.    She is not working.   Her mood is good.  She is not working but is taking care of her 60month old granddaughter.    Her Insulin she is using 60 units am and 30 or 20 untis with supper      Relevant past medical, surgical, family and social history reviewed and updated as indicated. Interim medical history since our last visit reviewed. Allergies and medications reviewed and updated.   Current Outpatient Medications:    amLODipine (NORVASC) 10 MG tablet, Take 1 tablet (10 mg total) by mouth daily., Disp: 30 tablet, Rfl: 0   atorvastatin (LIPITOR) 80 MG tablet, TAKE 1 Tablet BY MOUTH ONCE EVERY DAY, Disp: 30 tablet, Rfl: 0   diclofenac Sodium (VOLTAREN) 1 % GEL, Apply 2 g topically 4 (four) times daily., Disp: 20 g, Rfl: 0   gabapentin (NEURONTIN) 300 MG capsule, Tome one capsule in am and lunchtime; then 2 capsules at bedtime., Disp: 120 capsule, Rfl: 3   insulin NPH-regular Human (NOVOLIN 70/30 RELION) (70-30) 100 UNIT/ML injection, Inject 60 units SQ before breakfast and 40 units before evening meal.   Injecte 60 unidades subcutaneamente dos veces al dia con  el alimento y 40 unidades antes de el alimento de la tarde, Disp: 10 mL, Rfl: 0   losartan (COZAAR) 100 MG tablet, Take 1 tablet (100 mg total) by mouth daily. Tome una tableta por boca diaria, Disp: 30 tablet, Rfl: 0   metFORMIN (GLUCOPHAGE) 1000 MG tablet, TAKE 1 TABLET BY MOUTH TWICE DAILY WITH A MEAL.   Tome una tableta por boca dos veces diarias con comida, Disp: 60 tablet, Rfl: 0   Omega-3 Fatty Acids (FISH OIL) 1000 MG CAPS, Take 1 capsule (1,000 mg total) by mouth in the morning and at bedtime., Disp: 60 capsule, Rfl: 2   omeprazole (PRILOSEC) 20 MG capsule, Take 1 capsule (20 mg total) by mouth daily. Tome una tableta por boca diaria, Disp: 90 capsule, Rfl: 1   cyclobenzaprine (FLEXERIL) 10 MG tablet, TAKE 1 TABLET BY MOUTH TWICE DAILY AS NEEDED FOR MUSCLE SPASM (Patient not taking: Reported on 04/06/2021), Disp: 20 tablet, Rfl: 0    Review of Systems  Per HPI unless specifically indicated above     Objective:    BP 127/73    Pulse 73    Temp (!) 96.5 F (35.8 C)  Wt 165 lb (74.8 kg)    LMP 02/15/2011 (Approximate)    SpO2 98%    BMI 32.77 kg/m   Wt Readings from Last 3 Encounters:  04/06/21 165 lb (74.8 kg)  11/25/20 166 lb (75.3 kg)  10/20/20 167 lb (75.8 kg)    Physical Exam Vitals reviewed.  Constitutional:      General: She is not in acute distress.    Appearance: She is well-developed. She is obese. She is not ill-appearing.  HENT:     Head: Normocephalic and atraumatic.  Cardiovascular:     Rate and Rhythm: Normal rate and regular rhythm.  Pulmonary:     Effort: Pulmonary effort is normal.     Breath sounds: Normal breath sounds.  Abdominal:     General: Bowel sounds are normal.     Palpations: Abdomen is soft. There is no mass.     Tenderness: There is no abdominal tenderness.  Musculoskeletal:     Cervical back: Neck supple.     Right lower leg: No edema.     Left lower leg: No edema.  Lymphadenopathy:     Cervical: No cervical adenopathy.  Skin:     General: Skin is warm and dry.  Neurological:     Mental Status: She is alert and oriented to person, place, and time.  Psychiatric:        Behavior: Behavior normal.    Results for orders placed or performed during the hospital encounter of 04/02/21  Hemoglobin A1c  Result Value Ref Range   Hgb A1c MFr Bld 9.5 (H) 4.8 - 5.6 %   Mean Plasma Glucose 225.95 mg/dL  Lipid panel  Result Value Ref Range   Cholesterol 119 0 - 200 mg/dL   Triglycerides 179 (H) <150 mg/dL   HDL 38 (L) >40 mg/dL   Total CHOL/HDL Ratio 3.1 RATIO   VLDL 36 0 - 40 mg/dL   LDL Cholesterol 45 0 - 99 mg/dL  Comprehensive metabolic panel  Result Value Ref Range   Sodium 135 135 - 145 mmol/L   Potassium 4.4 3.5 - 5.1 mmol/L   Chloride 101 98 - 111 mmol/L   CO2 24 22 - 32 mmol/L   Glucose, Bld 240 (H) 70 - 99 mg/dL   BUN 16 6 - 20 mg/dL   Creatinine, Ser 0.78 0.44 - 1.00 mg/dL   Calcium 8.9 8.9 - 10.3 mg/dL   Total Protein 7.6 6.5 - 8.1 g/dL   Albumin 3.9 3.5 - 5.0 g/dL   AST 16 15 - 41 U/L   ALT 16 0 - 44 U/L   Alkaline Phosphatase 101 38 - 126 U/L   Total Bilirubin 0.9 0.3 - 1.2 mg/dL   GFR, Estimated >60 >60 mL/min   Anion gap 10 5 - 15      Assessment & Plan:    Encounter Diagnoses  Name Primary?   Uncontrolled type 2 diabetes mellitus with hyperglycemia ([place]) Yes   Essential hypertension    Hyperlipidemia, unspecified hyperlipidemia type    Diabetic retinopathy associated with type 2 diabetes mellitus, macular edema presence unspecified, unspecified laterality, unspecified retinopathy severity ([place])    Not proficient in [place] language     -Reviewed labs -pt to use Insulin 55 and 35 and monitor blood sugars.  Discussed with pt need for more aggressive control of the a1c and she is in agreement -pt to continue her oral medications without change -she is to continue with eye doctor per their recomendations -follow up 1  month to review bs log.

## 2021-05-04 ENCOUNTER — Ambulatory Visit: Payer: Self-pay | Admitting: Physician Assistant

## 2021-05-04 DIAGNOSIS — Z789 Other specified health status: Secondary | ICD-10-CM

## 2021-05-04 DIAGNOSIS — E1165 Type 2 diabetes mellitus with hyperglycemia: Secondary | ICD-10-CM

## 2021-05-04 NOTE — Progress Notes (Signed)
? ?LMP 02/15/2011 (Approximate)   ? ?Subjective:  ? ? Patient ID: Amanda Zamora, female    DOB: [birth year], 61 y.o.   MRN: [id number] ? ?HPI: ?Amanda Zamora is a 61 y.o. female presenting on 05/04/2021 for No chief complaint on file. ? ? ?HPI ? ?This is a telemedicine appointment through Telephone as pt was having problems with her virtual connection through [employer] (she connected with video briefly but then it froze). ? ?I connected with  Amanda Zamora on 05/04/21 by a video enabled telemedicine application and verified that I am speaking with the correct person using two identifiers. ?  ?I discussed the limitations of evaluation and management by telemedicine. The patient expressed understanding and agreed to proceed. ? ?Pt is at home.  Provider and translator are at office.  ? ? ?Pt is 61yoF with uncontrolled DM who has complications from the DM.   ? ?She says she is Using 65 units insulin bid.  At her last appointment she was agreed to use 55 and 35units. ?She has been monitoring her blood sugars.   ?BS running- 130, 290, 210, 89, 39 (2:30 am) ?Highest bs 403.   ?She says she is Scared to eat after it was so high.    ?She has not had an episode of LOC due to her bs.  ? ? ?Relevant past medical, surgical, family and social history reviewed and updated as indicated. Interim medical history since our last visit reviewed. ?Allergies and medications reviewed and updated. ? ? ?Current Outpatient Medications:  ?  insulin NPH-regular Human (NOVOLIN 70/30 RELION) (70-30) 100 UNIT/ML injection, Inject 55 units SQ before breakfast and 35 units before evening meal.   Injecte 55 unidades subcutaneamente dos veces al dia con el alimento y 35 unidades antes de el alimento de la tarde, Disp: 10 mL, Rfl: 0 ?  amLODipine (NORVASC) 10 MG tablet, Take 1 tablet (10 mg total) by mouth daily., Disp: 30 tablet, Rfl: 0 ?  atorvastatin (LIPITOR) 80 MG tablet, TAKE 1 Tablet BY MOUTH ONCE EVERY DAY, Disp: 30 tablet, Rfl: 0 ?   cyclobenzaprine (FLEXERIL) 10 MG tablet, TAKE 1 TABLET BY MOUTH TWICE DAILY AS NEEDED FOR MUSCLE SPASM, Disp: 20 tablet, Rfl: 0 ?  diclofenac Sodium (VOLTAREN) 1 % GEL, Apply 2 g topically 4 (four) times daily., Disp: 20 g, Rfl: 0 ?  gabapentin (NEURONTIN) 300 MG capsule, Tome one capsule in am and lunchtime; then 2 capsules at bedtime., Disp: 120 capsule, Rfl: 3 ?  losartan (COZAAR) 100 MG tablet, Take 1 tablet (100 mg total) by mouth daily. Tome una tableta por boca diaria, Disp: 30 tablet, Rfl: 0 ?  metFORMIN (GLUCOPHAGE) 1000 MG tablet, TAKE 1 TABLET BY MOUTH TWICE DAILY WITH A MEAL.   Tome una tableta por boca dos veces diarias con comida, Disp: 60 tablet, Rfl: 0 ?  Omega-3 Fatty Acids (FISH OIL) 1000 MG CAPS, Take 1 capsule (1,000 mg total) by mouth in the morning and at bedtime., Disp: 60 capsule, Rfl: 2 ?  omeprazole (PRILOSEC) 20 MG capsule, Take 1 capsule (20 mg total) by mouth daily. Tome una tableta por boca diaria, Disp: 90 capsule, Rfl: 1 ? ? ? ?Review of Systems ? ?Per HPI unless specifically indicated above ? ?   ?Objective:  ?  ?LMP 02/15/2011 (Approximate)   ?Wt Readings from Last 3 Encounters:  ?04/06/21 165 lb (74.8 kg)  ?11/25/20 166 lb (75.3 kg)  ?10/20/20 167 lb (75.8 kg)  ?  ?Physical Exam ?Constitutional:   ?  General: She is not in acute distress. ?   Appearance: She is not toxic-appearing.  ?HENT:  ?   Head: Normocephalic and atraumatic.  ?Pulmonary:  ?   Effort: No respiratory distress.  ?   Comments: Pt is talking in complete sentences without dyspnea.  ?Neurological:  ?   Mental Status: She is alert and oriented to person, place, and time.  ?Psychiatric:     ?   Behavior: Behavior normal.  ? ? ? ? ? ?   ?Assessment & Plan:  ? ? ?Encounter Diagnoses  ?Name Primary?  ? Uncontrolled type 2 diabetes mellitus with hyperglycemia ([place]) Yes  ? Not proficient in English language   ? ? ? ?-Refer to RD for help with diabetic diet.  She is urged to eat regularly but smaller portions of foods lower  in carbs ?-she is to contact care connect if she needs assistance with transportation ?-Cut back insulin to 60u qam,  40u qhs.  She is encouraged to monitor her bs ?-Refer to endocrinologist for uncontrolled DM ?-follow up here 2 months.  She is to contact office sooner prn ?

## 2021-05-18 ENCOUNTER — Ambulatory Visit (INDEPENDENT_AMBULATORY_CARE_PROVIDER_SITE_OTHER): Payer: Self-pay | Admitting: Nurse Practitioner

## 2021-05-18 ENCOUNTER — Encounter: Payer: Self-pay | Admitting: Nutrition

## 2021-05-18 ENCOUNTER — Encounter: Payer: Self-pay | Admitting: Nurse Practitioner

## 2021-05-18 ENCOUNTER — Encounter: Payer: Self-pay | Attending: Physician Assistant | Admitting: Nutrition

## 2021-05-18 VITALS — BP 136/82 | HR 70 | Ht 60.5 in | Wt 165.2 lb

## 2021-05-18 DIAGNOSIS — E782 Mixed hyperlipidemia: Secondary | ICD-10-CM | POA: Insufficient documentation

## 2021-05-18 DIAGNOSIS — I10 Essential (primary) hypertension: Secondary | ICD-10-CM | POA: Insufficient documentation

## 2021-05-18 DIAGNOSIS — Z794 Long term (current) use of insulin: Secondary | ICD-10-CM

## 2021-05-18 DIAGNOSIS — E1140 Type 2 diabetes mellitus with diabetic neuropathy, unspecified: Secondary | ICD-10-CM | POA: Insufficient documentation

## 2021-05-18 DIAGNOSIS — E1165 Type 2 diabetes mellitus with hyperglycemia: Secondary | ICD-10-CM

## 2021-05-18 MED ORDER — NOVOLIN 70/30 RELION (70-30) 100 UNIT/ML ~~LOC~~ SUSP: SUBCUTANEOUS | 0 refills | Status: DC

## 2021-05-18 NOTE — Progress Notes (Signed)
? ?                                                    Endocrinology Consult Note  ?     05/18/2021, 12:39 PM ? ? ?Subjective:  ? ? Patient ID: Amanda Zamora, female    DOB: October 31, 1960.  ?Amanda Zamora is being seen in consultation for management of currently uncontrolled symptomatic diabetes requested by  Soyla Dryer, PA-C. ? ? ?Past Medical History:  ?Diagnosis Date  ? Detached retina   ? GERD (gastroesophageal reflux disease)   ? History of pyelonephritis   ? PONV (postoperative nausea and vomiting)   ? Type 2 diabetes mellitus ([place])   ? ? ?Past Surgical History:  ?Procedure Laterality Date  ? CHOLECYSTECTOMY N/A 12/23/2019  ? Procedure: LAPAROSCOPIC CHOLECYSTECTOMY;  Surgeon: Virl Cagey, MD;  Location: [facility];  Service: General;  Laterality: N/A;  ? EYE SURGERY Right   ? retina  ? LAPAROSCOPIC APPENDECTOMY N/A 07/05/2020  ? Procedure: APPENDECTOMY LAPAROSCOPIC;  Surgeon: Aviva Signs, MD;  Location: [facility];  Service: General;  Laterality: N/A;  ? TUBAL LIGATION    ? ? ?Social History  ? ?Socioeconomic History  ? Marital status: Single  ?  Spouse name: Not on file  ? Number of children: Not on file  ? Years of education: Not on file  ? Highest education level: Not on file  ?Occupational History  ? Not on file  ?Tobacco Use  ? Smoking status: Never  ? Smokeless tobacco: Never  ?Vaping Use  ? Vaping Use: Never used  ?Substance and Sexual Activity  ? Alcohol use: Not Currently  ?  Comment: previously would drink occ. beer last drink 06/2017  ? Drug use: No  ? Sexual activity: Yes  ?  Birth control/protection: None  ?Other Topics Concern  ? Not on file  ?Social History Narrative  ? Not on file  ? ?Social Determinants of Health  ? ?Financial Resource Strain: Not on file  ?Food Insecurity: Not on file  ?Transportation Needs: Not on file  ?Physical Activity: Not on file  ?Stress: Not on file  ?Social Connections: Not on file  ? ? ?Family History  ?Problem Relation Age of Onset  ? Breast  cancer Sister   ? Diabetes Sister   ? ? ?Outpatient Encounter Medications as of 05/18/2021  ?Medication Sig  ? amLODipine (NORVASC) 10 MG tablet Take 1 tablet (10 mg total) by mouth daily.  ? atorvastatin (LIPITOR) 80 MG tablet TAKE 1 Tablet BY MOUTH ONCE EVERY DAY  ? cyclobenzaprine (FLEXERIL) 10 MG tablet TAKE 1 TABLET BY MOUTH TWICE DAILY AS NEEDED FOR MUSCLE SPASM  ? diclofenac Sodium (VOLTAREN) 1 % GEL Apply 2 g topically 4 (four) times daily.  ? gabapentin (NEURONTIN) 300 MG capsule Tome one capsule in am and lunchtime; then 2 capsules at bedtime.  ? losartan (COZAAR) 100 MG tablet Take 1 tablet (100 mg total) by mouth daily. Tome una tableta por boca diaria  ? metFORMIN (GLUCOPHAGE) 1000 MG tablet TAKE 1 TABLET BY MOUTH TWICE DAILY WITH A MEAL.   Tome una tableta por boca dos veces diarias con comida  ? Omega-3 Fatty Acids (FISH OIL) 1000 MG CAPS Take 1 capsule (1,000 mg total) by mouth in the morning and at bedtime.  ? omeprazole (PRILOSEC) 20 MG capsule Take  1 capsule (20 mg total) by mouth daily. Tome una tableta por boca diaria  ? [DISCONTINUED] insulin NPH-regular Human (NOVOLIN 70/30 RELION) (70-30) 100 UNIT/ML injection Inject 55 units SQ before breakfast and 35 units before evening meal.   Injecte 55 unidades subcutaneamente dos veces al dia con el alimento y 35 unidades antes de el alimento de la tarde  ? insulin NPH-regular Human (NOVOLIN 70/30 RELION) (70-30) 100 UNIT/ML injection 40 units BID  ? ?No facility-administered encounter medications on file as of 05/18/2021.  ? ? ?ALLERGIES: ?No Known Allergies ? ?VACCINATION STATUS: ?Immunization History  ?Administered Date(s) Administered  ? Influenza Split 11/05/2014  ? Influenza-Unspecified 12/02/2018  ? Moderna Sars-Covid-2 Vaccination 05/28/2019, 06/27/2019  ? Tdap 09/30/2019  ? ? ?Diabetes ?She presents for her initial diabetic visit. She has type 2 diabetes mellitus. Onset time: diagnosed at approx age of 53. Her disease course has been fluctuating.  Hypoglycemia symptoms include nervousness/anxiousness, sweats and tremors. Associated symptoms include fatigue, polydipsia and polyuria. There are no hypoglycemic complications. Symptoms are stable. Diabetic complications include peripheral neuropathy and retinopathy. Risk factors for coronary artery disease include diabetes mellitus, dyslipidemia, family history, obesity, hypertension and sedentary lifestyle. Current diabetic treatment includes insulin injections and oral agent (monotherapy). She is compliant with treatment most of the time. Her weight is fluctuating minimally. She is following a generally healthy diet. When asked about meal planning, she reported none. She has not had a previous visit with a dietitian. She participates in exercise intermittently. (She presents today for her consultation, accompanied by [place] interpreter, with no logs to review.  Her most recent A1c was 9.5% on 04/02/21.  She monitors glucose 3 times daily.  She drinks coffee with splenda and milk, water and sometimes a SF gatorade if she is sick.  She eats 3 meals per day and eats some snacks.  She does not engage in routine physical activity outside of her daily tasks.  She is UTD on eye exam, never seen podiatry in the past.  ) An ACE inhibitor/angiotensin II receptor blocker is being taken. She does not see a podiatrist.Eye exam is current.  ?Hypertension ?This is a chronic problem. The current episode started more than 1 year ago. The problem has been resolved since onset. The problem is controlled. Associated symptoms include sweats. There are no associated agents to hypertension. Risk factors for coronary artery disease include diabetes mellitus, dyslipidemia, family history, obesity and sedentary lifestyle. Past treatments include calcium channel blockers and angiotensin blockers. The current treatment provides moderate improvement. Compliance problems include diet and exercise.  Hypertensive end-organ damage includes  retinopathy.  ?Hyperlipidemia ?This is a chronic problem. The current episode started more than 1 year ago. The problem is controlled. Recent lipid tests were reviewed and are normal. Exacerbating diseases include diabetes and obesity. Factors aggravating her hyperlipidemia include fatty foods. Current antihyperlipidemic treatment includes statins. The current treatment provides moderate improvement of lipids. Compliance problems include adherence to diet and adherence to exercise.  Risk factors for coronary artery disease include diabetes mellitus, dyslipidemia, family history, obesity, hypertension and a sedentary lifestyle.  ? ? ?Review of systems ? ?Constitutional: + Minimally fluctuating body weight, current Body mass index is 31.73 kg/m?., no fatigue, no subjective hyperthermia, no subjective hypothermia ?Eyes: + blurry vision- hx retinopathy, no xerophthalmia ?ENT: no sore throat, no nodules palpated in throat, no dysphagia/odynophagia, no hoarseness ?Cardiovascular: no chest pain, no shortness of breath, no palpitations, no leg swelling ?Respiratory: no cough, no shortness of breath ?Gastrointestinal: no nausea/vomiting/diarrhea ?  Musculoskeletal: no muscle/joint aches ?Skin: no rashes, no hyperemia ?Neurological: no tremors, no numbness, no tingling, no dizziness ?Psychiatric: no depression, no anxiety ? ?Objective:  ?  ? ?BP 136/82   Pulse 70   Ht 5' 0.5" (1.537 m)   Wt 165 lb 3.2 oz (74.9 kg)   LMP 02/15/2011 (Approximate)   SpO2 99%   BMI 31.73 kg/m?   ?Wt Readings from Last 3 Encounters:  ?05/18/21 165 lb 3.2 oz (74.9 kg)  ?04/06/21 165 lb (74.8 kg)  ?11/25/20 166 lb (75.3 kg)  ?  ? ?BP Readings from Last 3 Encounters:  ?05/18/21 136/82  ?04/06/21 127/73  ?11/25/20 126/66  ?  ? ?Physical Exam- Limited ? ?Constitutional:  Body mass index is 31.73 kg/m?. , not in acute distress, normal state of mind ?Eyes:  EOMI, no exophthalmos ?Neck: Supple ?Cardiovascular: RRR, no murmurs, rubs, or gallops, no  edema ?Respiratory: Adequate breathing efforts, no crackles, rales, rhonchi, or wheezing ?Musculoskeletal: no gross deformities, strength intact in all four extremities, no gross restriction of joint movements ?S

## 2021-05-18 NOTE — Progress Notes (Signed)
Medical Nutrition Therapy  ?Appointment Start time:  1015  Appointment End time:  1145 ? ?Primary concerns today: Diabetes Type 2  ?Referral diagnosis: E11.8 ?Preferred learning style: Hear  ?Learning readiness: Readu ? ? ?NUTRITION ASSESSMENT  ?Translator here. ?Here with her daughter in law. Had DM x 18 yrs ago. ?FBS 140-150's in am. ?Hasn't met with a dietitian before. Wants to get her BS under better control and get her A1C down. ?Understands a little english. Prefers spanish written material. ?She notes she has been eating a lot of rice, tortillas and recently cut them out. Has been avoiding carbs and mostly eating protein.. Likes vegetables but hasn't been eating much. Has avoided fruit since it is sweet. ? ?Can't see well. Has had 2 surgeries on right her eyes. 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 ? ?Lab Results  ?Component Value Date  ? HGBA1C 9.5 (H) 04/02/2021  ? ?Anthropometrics  ?Wt Readings from Last 3 Encounters:  ?05/18/21 165 lb 3.2 oz (74.9 kg)  ?04/06/21 165 lb (74.8 kg)  ?11/25/20 166 lb (75.3 kg)  ? ?Ht Readings from Last 3 Encounters:  ?05/18/21 5' 0.5" (1.537 m)  ?11/25/20 4' 11.5" (1.511 m)  ?07/28/20 4' 11.5" (1.511 m)  ? ?There is no height or weight on file to calculate BMI. ?@BMIFA@ ?Facility age limit for growth percentiles is 20 years. ?Facility age limit for growth percentiles is 20 years. ? ? ?Clinical ?Medical Hx: see chart ?Medications: 70/30 insulin  40 units BID, Metformin 1000 mg BID, ?Labs:  ?Lab Results  ?Component Value Date  ? HGBA1C 9.5 (H) 04/02/2021  ? ? ?  Latest Ref Rng & Units 04/02/2021  ?  8:27 AM 10/16/2020  ? 10:42 AM 07/06/2020  ?  4:05 AM  ?CMP  ?Glucose 70 - 99 mg/dL 240   267   237    ?BUN 6 - 20 mg/dL 16   17   21    ?Creatinine 0.44 - 1.00 mg/dL 0.78   0.75   1.04    ?Sodium 135 - 145 mmol/L 135   136   137    ?Potassium 3.5 - 5.1 mmol/L 4.4   4.3   4.3    ?Chloride 98 - 111 mmol/L 101   99   105    ?CO2 22 - 32 mmol/L 24   27    24    ?Calcium 8.9 - 10.3 mg/dL 8.9   9.3   7.9    ?Total Protein 6.5 - 8.1 g/dL 7.6   7.5     ?Total Bilirubin 0.3 - 1.2 mg/dL 0.9   0.7     ?Alkaline Phos 38 - 126 U/L 101   85     ?AST 15 - 41 U/L 16   15     ?ALT 0 - 44 U/L 16   19     ? ?Lipid Panel  ?   ?Component Value Date/Time  ? CHOL 119 04/02/2021 0827  ? TRIG 179 (H) 04/02/2021 0827  ? HDL 38 (L) 04/02/2021 0827  ? CHOLHDL 3.1 04/02/2021 0827  ? VLDL 36 04/02/2021 0827  ? LDLCALC 45 04/02/2021 0827  ? ? ?Notable Signs/Symptoms: Increased thirsty, fatigue ? ?Lifestyle & Dietary Hx ?Lives alone.  Has been eating 1-2 meals per day.  ?4+ bottles of water per day . 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 ? ? ?Estimated daily fluid intake: 60 oz ?Supplements: Omega 3 fish   oil, ?Sleep: 8 ?Stress / self-care: none ?Current average weekly physical activity: ADL ? ?24-Hr Dietary Recall ?First Meal: Coffee and 1 cookie ?Snack:  ?Second Meal: Shrimp 8-10, fried, Water ?Snack:  ?Third Meal: Shrimp 8-10 ?Snack:  ?Beverages: 4 bottles of water ? ?Estimated Energy Needs ?Calories: 1200 ?Carbohydrate: 135g ?Protein: 90g ?Fat: 33g ? ? ?NUTRITION DIAGNOSIS  ?NB-1.1 Food and nutrition-related knowledge deficit As related to DIabetes Type 2.  As evidenced by A1C 9.5. ? ? ?NUTRITION INTERVENTION  ?Nutrition education (E-1) on the following topics:  ?Nutrition and Diabetes education provided on My Plate, CHO counting, meal planning, portion sizes, timing of meals, avoiding snacks between meals unless having a low blood sugar, target ranges for A1C and blood sugars, signs/symptoms and treatment of hyper/hypoglycemia, monitoring blood sugars, taking medications as prescribed, benefits of exercising 30 minutes per day and prevention of complications of DM.  ? ?The following Lifestyle Medicine recommendations according to American College of Lifestyle Medicine  ( ACLM) were discussed and and offered to patient and she  agrees to start the journey:  ?A. Whole Foods,  Plant-Based Nutrition comprising of fruits and vegetables, plant-based proteins, whole-grain carbohydrates was discussed in detail with the patient.   A list for source of those nutrients were also provided to the patient.  Patient will use only water or unsweetened tea for hydration. ?B.  The need to stay away from risky substances including alcohol, smoking; obtaining 7 to 9 hours of restorative sleep, at least 150 minutes of moderate intensity exercise weekly, the importance of healthy social connections,  and stress management techniques were discussed. ?C.  A full color page of  Calorie density of various food groups per pound showing examples of each food groups was provided to the patient. ? ?Handouts Provided Include  ?Lifestyle medicine ?Plant based meal plan ?Meal Plan Card-spanish ?Diabetes and You Spanish ?S/s of hyper/hypoglycemia-spanish ? ?Learning Style & Readiness for Change ?Teaching method utilized: Visual & Auditory  ?Demonstrated degree of understanding via: Teach Back  ?Barriers to learning/adherence to lifestyle change: Limited vision ? ?Goals Established by Pt ?Goals ? ?Eat three meals per day at times discussed ?Focus on Whole Food Plant based foods ?Increase fruits, vegetebles and whole grains ?Avoid processed meats and junk foods ?Drink only water ?Walk 30 minutes with someone 3-4 times per week ?Test BS twice a day ?Don't skip meals ?Eat 2 carb choices per meals ?Don't do Adkins/NO Carb diet. ?Get A1C down to 7% ? ? ?MONITORING & EVALUATION ?Dietary intake, weekly physical activity, and blood sugars in 1 month. ? ?Next Steps  ?Patient is to work on plant based meals. ? ?

## 2021-05-18 NOTE — Patient Instructions (Signed)
Diabetes mellitus y nutrici?n, en adultos ?Diabetes Mellitus and Nutrition, Adult ?Si sufre de diabetes, o diabetes mellitus, es muy importante tener h?bitos alimenticios saludables debido a que sus niveles de az?car en la sangre (glucosa) se ven afectados en gran medida por lo que come y bebe. [place] alimentos saludables en las cantidades correctas, aproximadamente a la misma hora todos los d?as, lo ayudar? a: ?Controlar su glucemia. ?Disminuir el riesgo de sufrir una enfermedad card?aca. ?Mejorar la presi?n arterial. ?Alcanzar o mantener un peso saludable. ??Qu? puede afectar mi plan de alimentaci?n? ?Todas las personas que sufren de diabetes son diferentes y cada una tiene necesidades diferentes en cuanto a un plan de alimentaci?n. El m?dico puede recomendarle que trabaje con un nutricionista para Financial trader plan para usted. Su plan de alimentaci?n puede variar seg?n factores como: ?Las calor?as que necesita. ?Los medicamentos que toma. ?Su peso. ?Sus niveles de glucemia, presi?n arterial y colesterol. ?Su nivel de [place]. ?Otras afecciones que tenga, como enfermedades card?acas o renales. ??C?mo me afectan los carbohidratos? ?Los carbohidratos, o hidratos de carbono, afectan su nivel de glucemia m?s que cualquier otro tipo de alimento. La ingesta de carbohidratos aumenta la cantidad de [employer]. ?Es importante conocer la cantidad de carbohidratos que se pueden ingerir en cada comida sin correr ning?n riesgo. Esto es Psychologist, forensic. Su nutricionista puede ayudarlo a calcular la cantidad de carbohidratos que debe ingerir en cada comida y en cada refrigerio. ??C?mo me afecta el alcohol? ?El alcohol puede provocar una disminuci?n de la glucemia (hipoglucemia), especialmente si [place] insulina o toma determinados medicamentos por v?a oral para la diabetes. La hipoglucemia es una afecci?n potencialmente mortal. Los s?ntomas de la hipoglucemia, como somnolencia, mareos y confusi?n, son  similares a los s?ntomas de haber consumido demasiado alcohol. ?No beba alcohol si: ?Su m?dico le indica no hacerlo. ?Est? embarazada, puede estar embarazada o est? tratando de quedar embarazada. ?Si bebe alcohol: ?Limite la cantidad que bebe a lo siguiente: ?De 0 a 1 medida por d?a para las mujeres. ?De 0 a 2 medidas por d?a para los hombres. ?Sepa cu?nta cantidad de alcohol hay en las bebidas que toma. En los [place], una medida equivale a una botella de cerveza de 12 oz (355 ml), un vaso de vino de 5 oz (148 ml) o un vaso de una bebida alcoh?lica de alta graduaci?n de 1? oz (44 ml). ?Mant?ngase hidratado bebiendo agua, refrescos diet?ticos o t? helado sin az?car. Tenga en cuenta que los refrescos comunes, los jugos y otras bebidas para mezclar pueden contener mucha az?car y se deben contar como carbohidratos. ?Consejos para seguir este plan ?Leer las etiquetas de los alimentos ?Comience por leer el tama?o de la porci?n en la etiqueta de Informaci?n nutricional de los alimentos envasados y las bebidas. La cantidad de calor?as, carbohidratos, grasas y otros nutrientes detallados en la etiqueta se basan en una porci?n del alimento. Muchos alimentos contienen m?s de una porci?n por envase. ?Verifique la cantidad total de gramos (g) de carbohidratos totales en una porci?n. ?Verifique la cantidad de gramos de grasas saturadas y grasas trans en una porci?n. Escoja alimentos que no contengan estas grasas o que su contenido de estas sea [place]. ?Verifique la cantidad de miligramos (mg) de sal (sodio) en una porci?n. La mayor?a de las personas deben limitar la ingesta de sodio total a menos de 2300 mg por d?a. ?Siempre consulte la informaci?n nutricional de los alimentos etiquetados como ?con bajo contenido de grasa? o ?sin grasa?Sydnee Levans  alimentos pueden tener un mayor contenido de az?car agregada o carbohidratos refinados, y deben evitarse. ?Hable con su nutricionista para identificar sus objetivos diarios en cuanto  a los nutrientes mencionados en la etiqueta. ?Al ir de compras ?Evite comprar alimentos procesados, enlatados o precocidos. Estos alimentos tienden a Special educational needs teacher mayor cantidad de [place], sodio y az?car agregada. ?Compre en la zona exterior de la tienda de comestibles. Esta es la zona donde se encuentran con mayor frecuencia las frutas y las verduras frescas, los cereales a granel, las carnes frescas y los productos l?cteos frescos. ?Al cocinar ?Use m?todos de cocci?n a baja temperatura, como hornear, en lugar de m?todos de cocci?n a alta temperatura, como fre?r en abundante aceite. ?Cocine con aceites saludables, como el aceite de [place], canola o [place]. ?Evite cocinar con manteca, crema o carnes con alto contenido de grasa. ?Planificaci?n de las comidas ?Coma las comidas y los refrigerios regularmente, preferentemente a la misma hora todos los d?as. Evite pasar largos per?odos de tiempo sin comer. ?Consuma alimentos ricos en fibra, como frutas frescas, verduras, frijoles y cereales integrales. ?Consuma entre 4 y 6 onzas (entre 112 y 168 g) de prote?nas magras por d?a, como carnes magras, pollo, pescado, huevos o tofu. Una onza (oz) (28 g) de prote?na magra equivale a: ?1 onza (28 g) de carne, pollo o pescado. ?1 huevo. ?? taza (62 g) de tofu. ?Coma algunos alimentos por d?a que contengan grasas saludables, como aguacates, frutos secos, semillas y pescado. ??Qu? alimentos debo comer? ?Lambert Mody ?Bayas. Manzanas. Naranjas. Duraznos. Damascos. Ciruelas. Uvas. Mangos. Papayas. Granadas. Kiwi. Cerezas. ?Verduras ?Verduras de [employer], que incluyen [place], [place], col rizada, acelga, hojas de berza, hojas de mostaza y repollo. Remolachas. Coliflor. Br?coli. Zanahorias. Jud?as verdes. Tomates. Pimientos. Cebollas. Pepinos. Coles de Bruselas. ?Granos ?Granos integrales, como panes, galletas, tortillas, cereales y pastas de salvado o integrales. Avena sin az?car. Quinua. Arroz integral o salvaje. ?Carnes y otras  prote?nas ?Frutos de mar. Carne de ave sin piel. Cortes magros de ave y carne de res. Tofu. Frutos secos. Semillas. ?L?cteos ?Productos l?cteos sin grasa o con bajo contenido de [place], como [place], yogur y [place]. ?Es posible que los productos detallados arriba no constituyan una lista completa de los alimentos y las bebidas que puede tomar. Consulte a un nutricionista para obtener m?s informaci?n. ??Qu? alimentos debo evitar? ?Lambert Mody ?Frutas enlatadas al alm?bar. ?Verduras ?Verduras enlatadas. Verduras congeladas con mantequilla o salsa de crema. ?Granos ?Productos elaborados con [place] y [place], como panes, pastas, bocadillos y cereales. Evite todos los alimentos procesados. ?Carnes y otras prote?nas ?Cortes de carne con alto contenido de [place]. Carne de ave con piel. Carnes empanizadas o fritas. Carne procesada. Evite las grasas saturadas. ?L?cteos ?Yogur, [place] ?Bebidas ?Bebidas azucaradas, como gaseosas o t? helado. ?Es posible que los productos que se enumeran m?s arriba no constituyan una lista completa de los alimentos y las bebidas que Nurse, adult. Consulte a un nutricionista para obtener m?s informaci?n. ?Preguntas para hacerle al m?dico ??Debo consultar con un especialista certificado en atenci?n y educaci?n sobre la diabetes? ??Es necesario que me re?na con un nutricionista? ??A qu? n?mero puedo llamar si tengo preguntas? ??Cu?les son los mejores momentos para controlar la glucemia? ?D?nde encontrar m?s informaci?n: ?American Diabetes Association (Asociaci?n Estadounidense de la Diabetes): diabetes.org ?Academy of Nutrition and Dietetics (Academia de Nutrici?n y Diet?tica): eatright.org ?[employer] of Diabetes and Digestive and Kidney Diseases [employer] de la Diabetes y [place] y [place]): AmenCredit.is ?Association of Diabetes Care &  Education Specialists (Asociaci?n de Especialistas en Atenci?n y Educaci?n sobre la Diabetes):  diabeteseducator.org ?Resumen ?Es importante tener h?bitos alimenticios saludables debido a que sus niveles de az?car en la sangre (glucosa) se ven afectados en gran medida por lo que come y bebe. Es importante consumir al

## 2021-06-01 ENCOUNTER — Encounter: Payer: Self-pay | Admitting: Nutrition

## 2021-06-01 HISTORY — PX: DENTAL SURGERY: SHX609

## 2021-06-01 NOTE — Patient Instructions (Signed)
Goals ? ?Eat three meals per day at times discussed ?Focus on Whole Food Plant based foods ?Increase fruits, vegetebles and whole grains ?Avoid processed meats and junk foods ?Drink only water ?Walk 30 minutes with someone 3-4 times per week ?Test BS twice a day ?Don't skip meals ?Eat 2 carb choices per meals ?Don't do Adkins/NO Carb diet. ?Get A1C down to 7% ? ?

## 2021-06-03 ENCOUNTER — Other Ambulatory Visit: Payer: Self-pay | Admitting: Physician Assistant

## 2021-06-03 MED ORDER — METFORMIN HCL 1000 MG PO TABS: ORAL_TABLET | ORAL | 0 refills | Status: DC

## 2021-06-08 ENCOUNTER — Encounter: Payer: Self-pay | Admitting: Nutrition

## 2021-06-08 ENCOUNTER — Other Ambulatory Visit: Payer: Self-pay | Admitting: Physician Assistant

## 2021-06-08 ENCOUNTER — Encounter: Payer: Self-pay | Admitting: Nurse Practitioner

## 2021-06-08 ENCOUNTER — Ambulatory Visit (INDEPENDENT_AMBULATORY_CARE_PROVIDER_SITE_OTHER): Payer: Self-pay | Admitting: Nurse Practitioner

## 2021-06-08 ENCOUNTER — Encounter: Payer: Self-pay | Attending: Physician Assistant | Admitting: Nutrition

## 2021-06-08 VITALS — Ht 62.0 in | Wt 174.0 lb

## 2021-06-08 VITALS — BP 126/71 | HR 68 | Ht 60.5 in | Wt 174.0 lb

## 2021-06-08 DIAGNOSIS — E782 Mixed hyperlipidemia: Secondary | ICD-10-CM

## 2021-06-08 DIAGNOSIS — E1140 Type 2 diabetes mellitus with diabetic neuropathy, unspecified: Secondary | ICD-10-CM | POA: Insufficient documentation

## 2021-06-08 DIAGNOSIS — Z794 Long term (current) use of insulin: Secondary | ICD-10-CM

## 2021-06-08 DIAGNOSIS — I10 Essential (primary) hypertension: Secondary | ICD-10-CM

## 2021-06-08 DIAGNOSIS — E1165 Type 2 diabetes mellitus with hyperglycemia: Secondary | ICD-10-CM

## 2021-06-08 MED ORDER — NOVOLIN 70/30 RELION (70-30) 100 UNIT/ML ~~LOC~~ SUSP: SUBCUTANEOUS | 0 refills | Status: DC

## 2021-06-08 NOTE — Patient Instructions (Signed)
La diabetes mellitus y las normas bsicas de atencin mdica Diabetes Mellitus and Standards of Medical Care Vivir con diabetes (diabetes mellitus) y controlarla puede ser complicado. Su tratamiento de la diabetes puede ser administrado por un equipo de profesionales de la salud, que incluye: Un mdico especializado en diabetes (endocrinlogo). Tambin podra tener visitas con un enfermero especializado o auxiliar mdico. Enfermeras. Un nutricionista certificado. Un especialista en atencin y educacin sobre la diabetes certificado. Un especialista en actividad fsica. Un farmacutico. Un oculista. Un especialista en pies (podlogo). Un proveedor de atencin dental. Un mdico de cabecera. Un profesional de salud mental. Cmo controlar la diabetes Puede hacer muchas cosas para controlar con xito la diabetes. Sus mdicos seguirn pautas para ayudarlo a obtener la mejor calidad de atencin. Estas son las pautas generales para su plan de control de la diabetes. Los mdicos tambin podrn darle instrucciones ms especficas. Exmenes fsicos Tras ser diagnosticado con diabetes, y cada ao luego de esto, su mdico le preguntar acerca de sus antecedentes mdicos y familiares. Le harn un examen fsico, que puede incluir: Medicin de la estatura, peso e ndice de masa corporal (IMC). Control de la presin arterial. Esto se realiza en cada visita mdica de rutina. La presin arterial deseada puede variar en funcin de las enfermedades, la edad y otros factores personales. Un examen de la tiroides. Un examen de la piel. Un examen para deteccin de dao nervioso (neuropata perifrica). Esto puede incluir controlar el pulso de las piernas y los pies, y el nivel de sensibilidad en las manos y pies. Un examen de pies para inspeccionar la estructura y la piel de los pies, lo que incluye controlar si hay cortes, moretones, enrojecimiento, ampollas, llagas u otros problemas. Exmenes de deteccin de  problemas en los vasos sanguneos (vasculares). Esto puede incluir controlar el pulso en las piernas y los pies, y controlar la temperatura. Anlisis de sangre Segn el plan de tratamiento y de las necesidades personales, es posible que se le realicen las siguientes pruebas: Hemoglobina A1C (HbA1C). Este anlisis proporciona informacin sobre el control de la glucemia (glucosa en la sangre) durante los ltimos 2 o 3 meses. Se usa para ajustar el plan de tratamiento, de ser necesario. Este anlisis se har: Al menos 2 veces al ao, si cumple los objetivos del tratamiento. Cuatro veces al ao, si no cumple los objetivos del tratamiento o si sus objetivos han cambiado. Anlisis de lpidos, lo que incluye colesterol total, colesterol LDL y HDL, y niveles de triglicridos. En relacin al LDL, el objetivo es tener menos de 100 mg/dl (5,5 mmol/l). Si tiene alto riesgo de complicaciones, el objetivo es tener menos de 70 mg/dl (3.9 mmol/l). En relacin al HDL, el objetivo es tener 40 mg/dl (2.2 mmol/l) o ms para los hombres y 50 mg/dl (2.8 mmol/l) o ms para las mujeres. Un nivel de colesterol HDL de 60 mg/dl (3.3 mmol/l) o superior da una cierta proteccin contra la enfermedad cardaca. En relacin a los triglicridos, el objetivo es tener menos de 150 mg/dl (8,3 mmol/l). Pruebas funcionales hepticas. Pruebas de la funcin renal. Pruebas de la funcin tiroidea.  Exmenes dentales y oculares  Visite al dentista dos veces por ao. Si tiene diabetes tipo 1, el mdico puede recomendarle que se haga un examen ocular en un plazo de 5 aos despus del diagnstico y, luego, una vez al ao despus del primer examen. Para los nios que tienen diabetes tipo 1, el pediatra puede recomendar un examen ocular cuando el nio tiene 11   aos o ms y ha tenido diabetes durante 3 a 5 aos. Despus del primer examen, el nio debe hacerse un examen ocular una vez al ao. Si tiene diabetes tipo 2, el mdico puede recomendarle  que se haga un examen ocular ni bien haya sido diagnosticado y, luego, cada 1 o 2 aos despus del primer examen. Vacunas Se recomienda aplicar de forma anual la vacuna contra la gripe (influenza) a todas las personas de 6 meses en adelante. Esto es muy importante si tiene diabetes. La vacuna contra la neumona (antineumoccica) est recomendada para todas las personas de 2 aos en adelante que tengan diabetes. Si es mayor de 65 aos, puede recibir la vacuna antineumoccica como una serie de dos inyecciones diferentes. Se recomienda administrar la vacuna contra la hepatitis B en adultos poco despus de que hayan recibido el diagnstico de diabetes. Los adultos y los nios que tienen diabetes deben recibir todas las vacunas de acuerdo con las recomendaciones especficas segn la edad de los Centros para el Control y la Prevencin de Enfermedades (Centers for Disease Control and Prevention, CDC). Salud mental y emocional Se recomienda realizar controles para detectar sntomas de trastornos de la alimentacin, ansiedad y depresin en el momento del diagnstico, y posteriormente segn sea necesario. Si los controles revelan la presencia de sntomas, es posible que deba someterse a ms evaluaciones. Es posible que trabaje con un profesional de la salud mental. Siga estas instrucciones en su casa: Plan de tratamiento Usted medir sus niveles de glucemia y quizs pueda administrarse insulina. Su plan de tratamiento ser revisado en cada visita mdica. Usted y el mdico analizarn lo siguiente: Cmo est recibiendo los medicamentos, incluso la insulina. Cualquier efecto secundario que tenga. Sus objetivos deseados con relacin al nivel de glucemia. Con qu frecuencia se mide el nivel de glucemia. Hbitos del estilo de vida, como nivel de actividad y el consumo de tabaco, alcohol y drogas. Educacin El mdico evaluar qu tan bien se est midiendo los niveles de glucemia y si est recibiendo la insulina y  los medicamentos de manera correcta. El mdico puede derivarlo a: Un especialista en atencin y educacin sobre la diabetes certificado, para manejar la diabetes durante toda la vida, comenzando desde el diagnstico. Un nutricionista matriculado que puede crear y revisar un plan de nutricin personal. Un especialista en ejercicios que puede analizar el nivel de actividad y un plan de ejercicios. Instrucciones generales Use los medicamentos de venta libre y los recetados solamente como se lo haya indicado el mdico. Cumpla con todas las visitas de seguimiento. Esto es importante. Dnde buscar apoyo Hay muchas redes de apoyo para la diabetes, entre ellas: American Diabetes Association (ADA) (Asociacin Estadounidense de la Diabetes): diabetes.org Defeat Diabetes Foundation (Fundacin Defeat Diabetes): defeatdiabetes.org Dnde buscar ms informacin American Diabetes Association (ADA) (Asociacin Estadounidense de la Diabetes): www.diabetes.org Association of Diabetes Care & Education Specialists (ADCES) (Asociacin de Especialistas en Atencin y Educacin sobre la Diabetes): diabeteseducator.org International Diabetes Federation (IDF) (Federacin Internacional de Diabetes): idf.org Resumen Tratar la diabetes (diabetes mellitus) puede ser complicado. Su tratamiento de la diabetes puede ser administrado por un equipo de profesionales de la salud. Los mdicos siguen pautas para ayudarlo a obtener la mejor calidad de atencin. Usted debe realizarse exmenes fsicos, anlisis de sangre y controles de la presin arterial, aplicarse vacunas y someterse a estudios de control con regularidad. Mantngase al da sobre cmo controlar la diabetes. Sus mdicos tambin podrn darle instrucciones ms especficas basndose en su salud. Esta informacin no tiene como fin   reemplazar el consejo del mdico. Asegrese de hacerle al mdico cualquier pregunta que tenga. Document Revised: 09/12/2019 Document Reviewed:  09/12/2019 Elsevier Patient Education  2023 Elsevier Inc.  

## 2021-06-08 NOTE — Patient Instructions (Signed)
Goals ? ?Increase walking to 30 minutes 4 times per week. ?Increase water to 80 oz per day ?Increase high fiber foods of raisin, prunes to help with constipation. ?Get Am BS less than 130 and Before supper less than 150 mg/dl/ ?Get A1C to 7% or less. ? ?

## 2021-06-08 NOTE — Progress Notes (Signed)
? ?                                                    Endocrinology Consult Note  ?     06/08/2021, 9:11 AM ? ? ?Subjective:  ? ? Patient ID: Amanda Zamora, female    DOB: October 27, 1960.  ?Amanda Zamora is being seen in consultation for management of currently uncontrolled symptomatic diabetes requested by  Soyla Dryer, PA-C. ? ? ?Past Medical History:  ?Diagnosis Date  ? Detached retina   ? GERD (gastroesophageal reflux disease)   ? History of pyelonephritis   ? PONV (postoperative nausea and vomiting)   ? Type 2 diabetes mellitus ([place])   ? ? ?Past Surgical History:  ?Procedure Laterality Date  ? CHOLECYSTECTOMY N/A 12/23/2019  ? Procedure: LAPAROSCOPIC CHOLECYSTECTOMY;  Surgeon: Virl Cagey, MD;  Location: [facility];  Service: General;  Laterality: N/A;  ? DENTAL SURGERY  06/01/2021  ? Removed 5 teeth  ? EYE SURGERY Right   ? retina  ? LAPAROSCOPIC APPENDECTOMY N/A 07/05/2020  ? Procedure: APPENDECTOMY LAPAROSCOPIC;  Surgeon: Aviva Signs, MD;  Location: [facility];  Service: General;  Laterality: N/A;  ? TUBAL LIGATION    ? ? ?Social History  ? ?Socioeconomic History  ? Marital status: Single  ?  Spouse name: Not on file  ? Number of children: Not on file  ? Years of education: Not on file  ? Highest education level: Not on file  ?Occupational History  ? Not on file  ?Tobacco Use  ? Smoking status: Never  ? Smokeless tobacco: Never  ?Vaping Use  ? Vaping Use: Never used  ?Substance and Sexual Activity  ? Alcohol use: Not Currently  ?  Comment: previously would drink occ. beer last drink 06/2017  ? Drug use: No  ? Sexual activity: Yes  ?  Birth control/protection: None  ?Other Topics Concern  ? Not on file  ?Social History Narrative  ? Not on file  ? ?Social Determinants of Health  ? ?Financial Resource Strain: Not on file  ?Food Insecurity: Not on file  ?Transportation Needs: Not on file  ?Physical Activity: Not on file  ?Stress: Not on file  ?Social Connections: Not on file   ? ? ?Family History  ?Problem Relation Age of Onset  ? Breast cancer Sister   ? Diabetes Sister   ? ? ?Outpatient Encounter Medications as of 06/08/2021  ?Medication Sig  ? amLODipine (NORVASC) 10 MG tablet Take 1 tablet (10 mg total) by mouth daily.  ? atorvastatin (LIPITOR) 80 MG tablet TAKE 1 Tablet BY MOUTH ONCE EVERY DAY  ? cyclobenzaprine (FLEXERIL) 10 MG tablet TAKE 1 TABLET BY MOUTH TWICE DAILY AS NEEDED FOR MUSCLE SPASM  ? diclofenac Sodium (VOLTAREN) 1 % GEL Apply 2 g topically 4 (four) times daily.  ? gabapentin (NEURONTIN) 300 MG capsule Tome one capsule in am and lunchtime; then 2 capsules at bedtime.  ? losartan (COZAAR) 100 MG tablet Take 1 tablet (100 mg total) by mouth daily. Tome una tableta por boca diaria  ? metFORMIN (GLUCOPHAGE) 1000 MG tablet TAKE 1 TABLET BY MOUTH TWICE DAILY WITH A MEAL.   Tome una tableta por boca dos veces diarias con comida  ? Omega-3 Fatty Acids (FISH OIL) 1000 MG CAPS Take 1 capsule (1,000 mg total) by mouth in the morning  and at bedtime.  ? omeprazole (PRILOSEC) 20 MG capsule Take 1 capsule (20 mg total) by mouth daily. Tome una tableta por boca diaria  ? [DISCONTINUED] insulin NPH-regular Human (NOVOLIN 70/30 RELION) (70-30) 100 UNIT/ML injection 40 units BID  ? insulin NPH-regular Human (NOVOLIN 70/30 RELION) (70-30) 100 UNIT/ML injection 50 units with breakfast and 40 units with supper if glucose is above 90 and she is eating.  ? ?No facility-administered encounter medications on file as of 06/08/2021.  ? ? ?ALLERGIES: ?No Known Allergies ? ?VACCINATION STATUS: ?Immunization History  ?Administered Date(s) Administered  ? Influenza Split 11/05/2014  ? Influenza-Unspecified 12/02/2018  ? Moderna Sars-Covid-2 Vaccination 05/28/2019, 06/27/2019  ? Tdap 09/30/2019  ? ? ?Diabetes ?She presents for her follow-up diabetic visit. She has type 2 diabetes mellitus. Onset time: diagnosed at approx age of 48. Her disease course has been fluctuating. There are no hypoglycemic  associated symptoms. Associated symptoms include fatigue, polydipsia and polyuria. There are no hypoglycemic complications. Symptoms are improving. Diabetic complications include peripheral neuropathy and retinopathy. Risk factors for coronary artery disease include diabetes mellitus, dyslipidemia, family history, obesity, hypertension and sedentary lifestyle. Current diabetic treatment includes insulin injections and oral agent (monotherapy). She is compliant with treatment most of the time. Her weight is fluctuating minimally. She is following a generally healthy diet. When asked about meal planning, she reported none. She has not had a previous visit with a dietitian. She participates in exercise intermittently. Her home blood glucose trend is decreasing steadily. Her breakfast blood glucose range is generally 110-130 mg/dl. Her lunch blood glucose range is generally 180-200 mg/dl. Her dinner blood glucose range is generally >200 mg/dl. Her bedtime blood glucose range is generally >200 mg/dl. (She presents today, accompanied by spanish interpreter, with her logs showing improved glycemic profile overall.  She was not due for another A1c today.  She denies any low readings since we adjusted her insulin dose at initial visit.  She continues to work on her diet, just saw Jearld Fenton, RDE today for diabetes education.) An ACE inhibitor/angiotensin II receptor blocker is being taken. She does not see a podiatrist.Eye exam is current.  ?Hypertension ?This is a chronic problem. The current episode started more than 1 year ago. The problem has been resolved since onset. The problem is controlled. There are no associated agents to hypertension. Risk factors for coronary artery disease include diabetes mellitus, dyslipidemia, family history, obesity and sedentary lifestyle. Past treatments include calcium channel blockers and angiotensin blockers. The current treatment provides moderate improvement. Compliance problems  include diet and exercise.  Hypertensive end-organ damage includes retinopathy.  ?Hyperlipidemia ?This is a chronic problem. The current episode started more than 1 year ago. The problem is controlled. Recent lipid tests were reviewed and are normal. Exacerbating diseases include diabetes and obesity. Factors aggravating her hyperlipidemia include fatty foods. Current antihyperlipidemic treatment includes statins. The current treatment provides moderate improvement of lipids. Compliance problems include adherence to diet and adherence to exercise.  Risk factors for coronary artery disease include diabetes mellitus, dyslipidemia, family history, obesity, hypertension and a sedentary lifestyle.  ? ? ?Review of systems ? ?Constitutional: + Minimally fluctuating body weight, current Body mass index is 33.42 kg/m?., no fatigue, no subjective hyperthermia, no subjective hypothermia ?Eyes: + blurry vision- hx retinopathy, no xerophthalmia ?ENT: no sore throat, no nodules palpated in throat, no dysphagia/odynophagia, no hoarseness ?Cardiovascular: no chest pain, no shortness of breath, no palpitations, no leg swelling ?Respiratory: no cough, no shortness of breath ?Gastrointestinal: no nausea/vomiting/diarrhea, +  constipation (on pain meds from recent tooth extraction) ?Musculoskeletal: no muscle/joint aches ?Skin: no rashes, no hyperemia ?Neurological: no tremors, no numbness, no tingling, no dizziness ?Psychiatric: no depression, no anxiety ? ?Objective:  ?  ? ?BP 126/71   Pulse 68   Ht 5' 0.5" (1.537 m)   Wt 174 lb (78.9 kg)   LMP 02/15/2011 (Approximate)   SpO2 99%   BMI 33.42 kg/m?   ?Wt Readings from Last 3 Encounters:  ?06/08/21 174 lb (78.9 kg)  ?06/08/21 174 lb (78.9 kg)  ?05/18/21 165 lb 3.2 oz (74.9 kg)  ?  ? ?BP Readings from Last 3 Encounters:  ?06/08/21 126/71  ?05/18/21 136/82  ?04/06/21 127/73  ?  ? ? ?Physical Exam- Limited ? ?Constitutional:  Body mass index is 33.42 kg/m?. , not in acute distress,  normal state of mind ?Eyes:  EOMI, no exophthalmos ?Neck: Supple ?Cardiovascular: RRR, no murmurs, rubs, or gallops, no edema ?Respiratory: Adequate breathing efforts, no crackles, rales, rhonchi, or wheezing ?Musculoskele

## 2021-06-08 NOTE — Progress Notes (Signed)
Medical Nutrition Therapy  DM Follow up ?Appointment Start time:  0800 Appointment End time:  0830 ? ?Primary concerns today: Diabetes Type 2  ?Referral diagnosis: E11.8 ?Preferred learning style: Hear  ?Learning readiness: Readu ? ? ?NUTRITION ASSESSMENT DM Follow up ? ?Translator here. Amanda Zamora. To see Amanda Pigg FNP today also. ? ?Changes made:  ?Taking 70/30 40 units BID. ?FBS 7 day avg 208 mg/dl, 14 206,30 days 216 mg/sl. ?Eating breakfast daily now. ? ?Has some elevated blood sugars. She thinks it was some juice she had drank. Had her upper teeth removed last week and hasn't had a lot to eat and had topain at times and feels her BS are partially due to that also. ? ?Eating more fresh fruits, vegetables and whole grains. ?Doing much better. Feels much better. ? ?Gained a few pounds but also feels very constipated and bloated right now. ? ?FBS 102-184, Before dinner 160-180's. ? ? ? ?Can't see well. Has had 2 surgeries on right her eyes. 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 ? ?Lab Results  ?Component Value Date  ? HGBA1C 9.5 (H) 04/02/2021  ? ?Anthropometrics  ?Wt Readings from Last 3 Encounters:  ?05/18/21 165 lb 3.2 oz (74.9 kg)  ?04/06/21 165 lb (74.8 kg)  ?11/25/20 166 lb (75.3 kg)  ? ?Ht Readings from Last 3 Encounters:  ?05/18/21 5' 0.5" (1.537 m)  ?11/25/20 4' 11.5" (1.511 m)  ?07/28/20 4' 11.5" (1.511 m)  ? ?There is no height or weight on file to calculate BMI. ?'@BMIFA'$ @ ?Facility age limit for growth percentiles is 20 years. ?Facility age limit for growth percentiles is 20 years. ? ? ?Clinical ?Medical Hx: see chart ?Medications: 70/30 insulin  40 units BID, Metformin 1000 mg BID, ?Labs:  ?Lab Results  ?Component Value Date  ? HGBA1C 9.5 (H) 04/02/2021  ? ? ?  Latest Ref Rng & Units 04/02/2021  ?  8:27 AM 10/16/2020  ? 10:42 AM 07/06/2020  ?  4:05 AM  ?CMP  ?Glucose 70 - 99 mg/dL 240   267   237    ?BUN 6 - 20 mg/dL '16   17   21    '$ ?Creatinine 0.44 - 1.00  mg/dL 0.78   0.75   1.04    ?Sodium 135 - 145 mmol/L 135   136   137    ?Potassium 3.5 - 5.1 mmol/L 4.4   4.3   4.3    ?Chloride 98 - 111 mmol/L 101   99   105    ?CO2 22 - 32 mmol/L '24   27   24    '$ ?Calcium 8.9 - 10.3 mg/dL 8.9   9.3   7.9    ?Total Protein 6.5 - 8.1 g/dL 7.6   7.5     ?Total Bilirubin 0.3 - 1.2 mg/dL 0.9   0.7     ?Alkaline Phos 38 - 126 U/L 101   85     ?AST 15 - 41 U/L 16   15     ?ALT 0 - 44 U/L 16   19     ? ?Lipid Panel  ?   ?Component Value Date/Time  ? CHOL 119 04/02/2021 0827  ? TRIG 179 (H) 04/02/2021 0827  ? HDL 38 (L) 04/02/2021 0827  ? CHOLHDL 3.1 04/02/2021 0827  ? VLDL 36 04/02/2021 0827  ? [place] 45 04/02/2021 0827  ? ? ?Notable Signs/Symptoms: Increased thirsty, fatigue ? ?Lifestyle & Dietary Hx ?Lives alone.  Has been eating 1-2 meals per day.  ?4+ bottles of water per day . 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 ? ? ?Estimated daily fluid intake: 60 oz ?Supplements: Omega 3 fish oil, ?Sleep: 8 ?Stress / self-care: none ?Current average weekly physical activity: ADL ? ?24-Hr Dietary Recall ?First Meal: Oatmeal and some coffee, or eggs, 1 slices whole wheat bread,  ?Snack:  ?Second Meal: Fish and broccoli, potato salad and carrots, water,  ? ?Snack:  ?Third Meal:  1/3 c beans, chicken nuggets grilled,water ?Snack:  ?Beverages: 4-5 bottles of water ? ?Estimated Energy Needs ?Calories: 1200 ?Carbohydrate: 135g ?Protein: 90g ?Fat: 33g ? ? ?NUTRITION DIAGNOSIS  ?NB-1.1 Food and nutrition-related knowledge deficit As related to DIabetes Type 2.  As evidenced by A1C 9.5. ? ? ?NUTRITION INTERVENTION  ?Nutrition education (E-1) on the following topics:  ?Nutrition and Diabetes education provided on My Plate, CHO counting, meal planning, portion sizes, timing of meals, avoiding snacks between meals unless having a low blood sugar, target ranges for A1C and blood sugars, signs/symptoms and treatment of hyper/hypoglycemia, monitoring blood sugars, taking medications as  prescribed, benefits of exercising 30 minutes per day and prevention of complications of DM.  ? ?The following Lifestyle Medicine recommendations according to [place]  [facility]) were discussed and and offered to patient and she  agrees to start the journey:  ?A. Whole Foods, Plant-Based Nutrition comprising of fruits and vegetables, plant-based proteins, whole-grain carbohydrates was discussed in detail with the patient.   A list for source of those nutrients were also provided to the patient.  Patient will use only water or unsweetened tea for hydration. ?B.  The need to stay away from risky substances including alcohol, smoking; obtaining 7 to 9 hours of restorative sleep, at least 150 minutes of moderate intensity exercise weekly, the importance of healthy social connections,  and stress management techniques were discussed. ?C.  A full color page of  Calorie density of various food groups per pound showing examples of each food groups was provided to the patient. ? ?Handouts Provided Include  ?Lifestyle medicine ?Plant based meal plan ?Meal Plan Card-spanish ?Diabetes and You Spanish ?S/s of hyper/hypoglycemia-spanish ? ?Learning Style & Readiness for Change ?Teaching method utilized: Visual & Auditory  ?Demonstrated degree of understanding via: Teach Back  ?Barriers to learning/adherence to lifestyle change: Limited vision ? ?Goals Established by Pt ?Goals ? ?Eat three meals per day at times discussed ?Focus on Whole Food Plant based foods ?Increase fruits, vegetebles and whole grains ?Avoid processed meats and junk foods ?Drink only water ?Walk 30 minutes with someone 3-4 times per week ?Test BS twice a day ?Don't skip meals ?Eat 2 carb choices per meals ?Don't do Adkins/NO Carb diet. ?Get A1C down to 7% ? ? ?MONITORING & EVALUATION ?Dietary intake, weekly physical activity, and blood sugars in 1 month. ? ?Next Steps  ?Patient is to work on plant based meals. ? ?

## 2021-06-10 ENCOUNTER — Other Ambulatory Visit: Payer: Self-pay | Admitting: Physician Assistant

## 2021-06-10 DIAGNOSIS — I10 Essential (primary) hypertension: Secondary | ICD-10-CM

## 2021-06-10 DIAGNOSIS — E1165 Type 2 diabetes mellitus with hyperglycemia: Secondary | ICD-10-CM

## 2021-06-10 DIAGNOSIS — E785 Hyperlipidemia, unspecified: Secondary | ICD-10-CM

## 2021-06-21 ENCOUNTER — Other Ambulatory Visit (HOSPITAL_COMMUNITY)
Admission: RE | Admit: 2021-06-21 | Discharge: 2021-06-21 | Disposition: A | Discharge status: Home / Self Care | Payer: Self-pay | Source: Ambulatory Visit | Attending: Physician Assistant | Admitting: Physician Assistant

## 2021-06-21 DIAGNOSIS — E1165 Type 2 diabetes mellitus with hyperglycemia: Secondary | ICD-10-CM | POA: Insufficient documentation

## 2021-06-21 DIAGNOSIS — I10 Essential (primary) hypertension: Secondary | ICD-10-CM | POA: Insufficient documentation

## 2021-06-21 DIAGNOSIS — E785 Hyperlipidemia, unspecified: Secondary | ICD-10-CM | POA: Insufficient documentation

## 2021-06-21 LAB — COMPREHENSIVE METABOLIC PANEL
ALT: 16 U/L (ref 0–44)
AST: 19 U/L (ref 15–41)
Albumin: 3.7 g/dL (ref 3.5–5.0)
Alkaline Phosphatase: 85 U/L (ref 38–126)
Anion gap: 8 (ref 5–15)
BUN: 19 mg/dL (ref 6–20)
CO2: 27 mmol/L (ref 22–32)
Calcium: 9.1 mg/dL (ref 8.9–10.3)
Chloride: 104 mmol/L (ref 98–111)
Creatinine, Ser: 0.86 mg/dL (ref 0.44–1.00)
GFR, Estimated: >60 mL/min (ref >60)
Glucose, Bld: 158 mg/dL — ABNORMAL HIGH (ref 70–99)
Potassium: 4.5 mmol/L (ref 3.5–5.1)
Sodium: 139 mmol/L (ref 135–145)
Total Bilirubin: 0.9 mg/dL (ref 0.3–1.2)
Total Protein: 7.7 g/dL (ref 6.5–8.1)

## 2021-06-21 LAB — LIPID PANEL
Cholesterol: 124 mg/dL (ref 0–200)
HDL: 37 mg/dL — ABNORMAL LOW (ref >40)
LDL Cholesterol: 37 mg/dL (ref 0–99)
Total CHOL/HDL Ratio: 3.4 RATIO
Triglycerides: 252 mg/dL — ABNORMAL HIGH (ref <150)
VLDL: 50 mg/dL — ABNORMAL HIGH (ref 0–40)

## 2021-06-22 ENCOUNTER — Ambulatory Visit: Payer: Self-pay | Admitting: Physician Assistant

## 2021-07-06 ENCOUNTER — Encounter: Payer: Self-pay | Admitting: Physician Assistant

## 2021-07-06 ENCOUNTER — Ambulatory Visit: Payer: Self-pay | Admitting: Physician Assistant

## 2021-07-06 VITALS — BP 122/62 | HR 67 | Temp 97.0°F | Wt 170.0 lb

## 2021-07-06 DIAGNOSIS — Z1239 Encounter for other screening for malignant neoplasm of breast: Secondary | ICD-10-CM

## 2021-07-06 DIAGNOSIS — E1165 Type 2 diabetes mellitus with hyperglycemia: Secondary | ICD-10-CM

## 2021-07-06 DIAGNOSIS — E11319 Type 2 diabetes mellitus with unspecified diabetic retinopathy without macular edema: Secondary | ICD-10-CM

## 2021-07-06 DIAGNOSIS — I10 Essential (primary) hypertension: Secondary | ICD-10-CM

## 2021-07-06 DIAGNOSIS — Z789 Other specified health status: Secondary | ICD-10-CM

## 2021-07-06 DIAGNOSIS — E785 Hyperlipidemia, unspecified: Secondary | ICD-10-CM

## 2021-07-06 MED ORDER — ATORVASTATIN CALCIUM 80 MG PO TABS: ORAL_TABLET | ORAL | 1 refills | Status: DC

## 2021-07-06 MED ORDER — AMLODIPINE BESYLATE 10 MG PO TABS: 10.0000 mg | ORAL_TABLET | Freq: Every day | ORAL | 0 refills | Status: DC

## 2021-07-06 MED ORDER — GABAPENTIN 300 MG PO CAPS: ORAL_CAPSULE | ORAL | 3 refills | Status: DC

## 2021-07-06 MED ORDER — METFORMIN HCL 1000 MG PO TABS: ORAL_TABLET | ORAL | 0 refills | Status: DC

## 2021-07-06 MED ORDER — OMEPRAZOLE 20 MG PO CPDR: 20.0000 mg | DELAYED_RELEASE_CAPSULE | Freq: Every day | ORAL | 1 refills | Status: DC

## 2021-07-06 MED ORDER — CYCLOBENZAPRINE HCL 10 MG PO TABS: ORAL_TABLET | ORAL | 0 refills | Status: DC

## 2021-07-06 NOTE — Patient Instructions (Addendum)
Lunes 12 junio  a las 3:45 con Dr. Ricki Miller

## 2021-07-06 NOTE — Progress Notes (Signed)
BP 122/62   Pulse 67   Temp (!) 97 F (36.1 C)   Wt 170 lb (77.1 kg)   LMP 02/15/2011 (Approximate)   SpO2 99%   BMI 32.65 kg/m    Subjective:    Patient ID: Amanda Zamora, female    DOB: 09-23-1960, 61 y.o.   MRN: [id number]  HPI: Amanda Zamora is a 61 y.o. female presenting on 07/06/2021 for Hypertension and Hyperlipidemia   HPI  Chief Complaint  Patient presents with   Hypertension   Hyperlipidemia    She sees endocrinology for management of her DM.   She says her Mood is good; she denies anxiety, depression.  She says her daughter was supposed to reschedule her 04/14/21 appointment with the retina specialist but she doesn't know when it's scheduled for.   She has no complaints today.    Relevant past medical, surgical, family and social history reviewed and updated as indicated. Interim medical history since our last visit reviewed. Allergies and medications reviewed and updated.   Current Outpatient Medications:    amLODipine (NORVASC) 10 MG tablet, Take 1 tablet (10 mg total) by mouth daily., Disp: 30 tablet, Rfl: 0   atorvastatin (LIPITOR) 80 MG tablet, TAKE 1 Tablet BY MOUTH ONCE EVERY DAY, Disp: 90 tablet, Rfl: 1   gabapentin (NEURONTIN) 300 MG capsule, Tome one capsule in am and lunchtime; then 2 capsules at bedtime., Disp: 120 capsule, Rfl: 3   insulin NPH-regular Human (NOVOLIN 70/30 RELION) (70-30) 100 UNIT/ML injection, 50 units with breakfast and 40 units with supper if glucose is above 90 and she is eating. (Patient taking differently: 50 units with breakfast and 30 units with supper if glucose is above 90 and she is eating.), Disp: 10 mL, Rfl: 0   losartan (COZAAR) 100 MG tablet, TAKE 1 Tablet BY MOUTH ONCE DAILY, Disp: 90 tablet, Rfl: 1   metFORMIN (GLUCOPHAGE) 1000 MG tablet, TAKE 1 TABLET BY MOUTH TWICE DAILY WITH A MEAL.   Tome una tableta por boca dos veces diarias con comida, Disp: 180 tablet, Rfl: 0   Omega-3 Fatty Acids (FISH OIL) 1000 MG  CAPS, Take 1 capsule (1,000 mg total) by mouth in the morning and at bedtime., Disp: 60 capsule, Rfl: 2   omeprazole (PRILOSEC) 20 MG capsule, Take 1 capsule (20 mg total) by mouth daily. Tome una tableta por boca diaria, Disp: 90 capsule, Rfl: 1   cyclobenzaprine (FLEXERIL) 10 MG tablet, TAKE 1 TABLET BY MOUTH TWICE DAILY AS NEEDED FOR MUSCLE SPASM (Patient not taking: Reported on 07/06/2021), Disp: 20 tablet, Rfl: 0   diclofenac Sodium (VOLTAREN) 1 % GEL, Apply 2 g topically 4 (four) times daily. (Patient not taking: Reported on 07/06/2021), Disp: 20 g, Rfl: 0    Review of Systems  Per HPI unless specifically indicated above     Objective:    BP 122/62   Pulse 67   Temp (!) 97 F (36.1 C)   Wt 170 lb (77.1 kg)   LMP 02/15/2011 (Approximate)   SpO2 99%   BMI 32.65 kg/m   Wt Readings from Last 3 Encounters:  07/06/21 170 lb (77.1 kg)  06/08/21 174 lb (78.9 kg)  06/08/21 174 lb (78.9 kg)    Physical Exam Vitals reviewed.  Constitutional:      General: She is not in acute distress.    Appearance: She is well-developed. She is not toxic-appearing.  HENT:     Head: Normocephalic and atraumatic.  Cardiovascular:     Rate  and Rhythm: Normal rate and regular rhythm.  Pulmonary:     Effort: Pulmonary effort is normal.     Breath sounds: Normal breath sounds.  Abdominal:     General: Bowel sounds are normal.     Palpations: Abdomen is soft. There is no mass.     Tenderness: There is no abdominal tenderness.  Musculoskeletal:     Cervical back: Neck supple.     Right lower leg: No edema.     Left lower leg: No edema.  Lymphadenopathy:     Cervical: No cervical adenopathy.  Skin:    General: Skin is warm and dry.  Neurological:     Mental Status: She is alert and oriented to person, place, and time.  Psychiatric:        Behavior: Behavior normal.    Results for orders placed or performed during the hospital encounter of 06/21/21  Lipid panel  Result Value Ref Range    Cholesterol 124 0 - 200 mg/dL   Triglycerides 252 (H) <150 mg/dL   HDL 37 (L) >40 mg/dL   Total CHOL/HDL Ratio 3.4 RATIO   VLDL 50 (H) 0 - 40 mg/dL   LDL Cholesterol 37 0 - 99 mg/dL  Comprehensive metabolic panel  Result Value Ref Range   Sodium 139 135 - 145 mmol/L   Potassium 4.5 3.5 - 5.1 mmol/L   Chloride 104 98 - 111 mmol/L   CO2 27 22 - 32 mmol/L   Glucose, Bld 158 (H) 70 - 99 mg/dL   BUN 19 6 - 20 mg/dL   Creatinine, Ser 0.86 0.44 - 1.00 mg/dL   Calcium 9.1 8.9 - 10.3 mg/dL   Total Protein 7.7 6.5 - 8.1 g/dL   Albumin 3.7 3.5 - 5.0 g/dL   AST 19 15 - 41 U/L   ALT 16 0 - 44 U/L   Alkaline Phosphatase 85 38 - 126 U/L   Total Bilirubin 0.9 0.3 - 1.2 mg/dL   GFR, Estimated >60 >60 mL/min   Anion gap 8 5 - 15      Assessment & Plan:   Encounter Diagnoses  Name Primary?   Essential hypertension Yes   Hyperlipidemia, unspecified hyperlipidemia type    Uncontrolled type 2 diabetes mellitus with hyperglycemia ([place])    Not proficient in English language    Diabetic retinopathy associated with type 2 diabetes mellitus, macular edema presence unspecified, unspecified laterality, unspecified retinopathy severity ([place])    Encounter for screening for malignant neoplasm of breast, unspecified screening modality       -Reviewed labs with pt -pt to continue current medications -pt to continue with endocrinology for DM -refer for screening Mammogram -Eye- nurse called eye doctor and got appointment information.   pt is given information for her appointment with retina specialist - 12 junio -pt to follow up 3 months.  She is to contact office sooner prn

## 2021-09-16 ENCOUNTER — Other Ambulatory Visit: Payer: Self-pay | Admitting: Physician Assistant

## 2021-09-16 DIAGNOSIS — E785 Hyperlipidemia, unspecified: Secondary | ICD-10-CM

## 2021-09-16 DIAGNOSIS — E1165 Type 2 diabetes mellitus with hyperglycemia: Secondary | ICD-10-CM

## 2021-09-16 DIAGNOSIS — I10 Essential (primary) hypertension: Secondary | ICD-10-CM

## 2021-09-21 ENCOUNTER — Encounter: Payer: Self-pay | Admitting: Nutrition

## 2021-09-21 ENCOUNTER — Encounter: Payer: Self-pay | Admitting: Nurse Practitioner

## 2021-09-21 ENCOUNTER — Encounter: Payer: Self-pay | Attending: Physician Assistant | Admitting: Nutrition

## 2021-09-21 ENCOUNTER — Other Ambulatory Visit (HOSPITAL_COMMUNITY)
Admission: RE | Admit: 2021-09-21 | Discharge: 2021-09-21 | Disposition: A | Discharge status: Home / Self Care | Payer: Self-pay | Source: Ambulatory Visit | Attending: Physician Assistant | Admitting: Physician Assistant

## 2021-09-21 ENCOUNTER — Ambulatory Visit (INDEPENDENT_AMBULATORY_CARE_PROVIDER_SITE_OTHER): Payer: Self-pay | Admitting: Nurse Practitioner

## 2021-09-21 VITALS — BP 127/77 | HR 62 | Ht 60.05 in | Wt 165.0 lb

## 2021-09-21 VITALS — Wt 165.0 lb

## 2021-09-21 DIAGNOSIS — E1140 Type 2 diabetes mellitus with diabetic neuropathy, unspecified: Secondary | ICD-10-CM | POA: Insufficient documentation

## 2021-09-21 DIAGNOSIS — E1165 Type 2 diabetes mellitus with hyperglycemia: Secondary | ICD-10-CM | POA: Insufficient documentation

## 2021-09-21 DIAGNOSIS — E782 Mixed hyperlipidemia: Secondary | ICD-10-CM

## 2021-09-21 DIAGNOSIS — Z794 Long term (current) use of insulin: Secondary | ICD-10-CM | POA: Insufficient documentation

## 2021-09-21 DIAGNOSIS — I10 Essential (primary) hypertension: Secondary | ICD-10-CM | POA: Insufficient documentation

## 2021-09-21 DIAGNOSIS — E785 Hyperlipidemia, unspecified: Secondary | ICD-10-CM | POA: Insufficient documentation

## 2021-09-21 LAB — COMPREHENSIVE METABOLIC PANEL
ALT: 14 U/L (ref 0–44)
AST: 20 U/L (ref 15–41)
Albumin: 3.9 g/dL (ref 3.5–5.0)
Alkaline Phosphatase: 77 U/L (ref 38–126)
Anion gap: 8 (ref 5–15)
BUN: 18 mg/dL (ref 6–20)
CO2: 27 mmol/L (ref 22–32)
Calcium: 9.1 mg/dL (ref 8.9–10.3)
Chloride: 103 mmol/L (ref 98–111)
Creatinine, Ser: 0.97 mg/dL (ref 0.44–1.00)
GFR, Estimated: >60 mL/min (ref >60)
Glucose, Bld: 164 mg/dL — ABNORMAL HIGH (ref 70–99)
Potassium: 5 mmol/L (ref 3.5–5.1)
Sodium: 138 mmol/L (ref 135–145)
Total Bilirubin: 0.9 mg/dL (ref 0.3–1.2)
Total Protein: 7.8 g/dL (ref 6.5–8.1)

## 2021-09-21 LAB — LIPID PANEL
Cholesterol: 97 mg/dL (ref 0–200)
HDL: 34 mg/dL — ABNORMAL LOW (ref >40)
LDL Cholesterol: 45 mg/dL (ref 0–99)
Total CHOL/HDL Ratio: 2.9 RATIO
Triglycerides: 91 mg/dL (ref <150)
VLDL: 18 mg/dL (ref 0–40)

## 2021-09-21 LAB — POCT GLYCOSYLATED HEMOGLOBIN (HGB A1C): HbA1c POC (<> result, manual entry): 7.7 % (ref 4.0–5.6)

## 2021-09-21 MED ORDER — NOVOLIN 70/30 RELION (70-30) 100 UNIT/ML ~~LOC~~ SUSP
SUBCUTANEOUS | 0 refills | Status: DC
Start: 2021-09-21 — End: 2021-12-22

## 2021-09-21 NOTE — Progress Notes (Unsigned)
Medical Nutrition Therapy  DM Follow up Appointment Start time: 0900Appointment End time:  0930  Primary concerns today: Diabetes Type 2  Referral diagnosis: E11.8 Preferred learning style: Hear  Learning readiness: Readu   NUTRITION ASSESSMENT DM Follow up Her daughter is here: Colman Cater to help translate. She didn't want the translators to be here with her since he daughter is here.   Changes made:   Was Taking 70/30 40 units BID but after today's visit, with Rayetta Pigg. she will reduce her 70/30 to 20 units BID daily. FBS 100-120's Bedtime 90-110's. Her blood sugars have improved tremendously. Had some low blood sugars less than 70 multiple times weekly. Eating breakfast daily now and eating three meals per day and better food choices. BS much better. She feels much better. More active.  Appt today with Rayetta Pigg, FNP.  Metformin 1000 mg BID.  Has been walking her grandson a lot Lost 6 lbs since last visit.   Can't see well. Has had 2 surgeries on right her eyes. 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  Lab Results  Component Value Date   HGBA1C 7.7 09/21/2021   Anthropometrics  Wt Readings from Last 3 Encounters:  09/21/21 165 lb (74.8 kg)  07/06/21 170 lb (77.1 kg)  06/08/21 174 lb (78.9 kg)   Ht Readings from Last 3 Encounters:  06/08/21 5' 0.5" (1.537 m)  06/08/21 '5\' 2"'$  (1.575 m)  05/18/21 5' 0.5" (1.537 m)   Body mass index is 32.17 kg/m. '@BMIFA'$ @ Facility age limit for growth %iles is 20 years. Facility age limit for growth %iles is 20 years.   Clinical Medical Hx: see chart Medications: 70/30 insulin  40 units BID, Metformin 1000 mg BID, Labs:  Lab Results  Component Value Date   HGBA1C 7.7 09/21/2021      Latest Ref Rng & Units 06/21/2021    8:54 AM 04/02/2021    8:27 AM 10/16/2020   10:42 AM  CMP  Glucose 70 - 99 mg/dL 158  240  267   BUN 6 - 20 mg/dL '19  16  17   '$ Creatinine 0.44 - 1.00 mg/dL 0.86   0.78  0.75   Sodium 135 - 145 mmol/L 139  135  136   Potassium 3.5 - 5.1 mmol/L 4.5  4.4  4.3   Chloride 98 - 111 mmol/L 104  101  99   CO2 22 - 32 mmol/L '27  24  27   '$ Calcium 8.9 - 10.3 mg/dL 9.1  8.9  9.3   Total Protein 6.5 - 8.1 g/dL 7.7  7.6  7.5   Total Bilirubin 0.3 - 1.2 mg/dL 0.9  0.9  0.7   Alkaline Phos 38 - 126 U/L 85  101  85   AST 15 - 41 U/L '19  16  15   '$ ALT 0 - 44 U/L '16  16  19    '$ Lipid Panel     Component Value Date/Time   CHOL 124 06/21/2021 0854   TRIG 252 (H) 06/21/2021 0854   HDL 37 (L) 06/21/2021 0854   CHOLHDL 3.4 06/21/2021 0854   VLDL 50 (H) 06/21/2021 0854   LDLCALC 37 06/21/2021 0854    Notable Signs/Symptoms: Increased thirsty, fatigue  Lifestyle & Dietary Hx Lives alone.  Has been eating 1-2 meals per day.  4+ bottles of water per day . 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   Estimated daily fluid intake:  60 oz Supplements: Omega 3 fish oil, Sleep: 8 Stress / self-care: none Current average weekly physical activity: ADL  24-Hr Dietary Recall First Meal: fruit, oatmeal, water Snack:  Second Meal:  Toast, asparagus and chicken breast. 1/2 banana, potato, water. Dinner:  chicken, tomatoes, onion on tostado, with lettuce,cheese and hot sauce.   Snack:  Third Meal:  1/3 c beans, chicken nuggets grilled,water Snack:  Beverages: 4-5 bottles of water  Estimated Energy Needs Calories: 1200 Carbohydrate: 135g Protein: 90g Fat: 33g   NUTRITION DIAGNOSIS  NB-1.1 Food and nutrition-related knowledge deficit As related to DIabetes Type 2.  As evidenced by A1C 9.5.   NUTRITION INTERVENTION  Nutrition education (E-1) on the following topics:  Nutrition and Diabetes education provided on My Plate, CHO counting, meal planning, portion sizes, timing of meals, avoiding snacks between meals unless having a low blood sugar, target ranges for A1C and blood sugars, signs/symptoms and treatment of hyper/hypoglycemia, monitoring blood  sugars, taking medications as prescribed, benefits of exercising 30 minutes per day and prevention of complications of DM.   The following Lifestyle Medicine recommendations according to [place]  [facility]) were discussed and and offered to patient and she  agrees to start the journey:  A. Whole Foods, Plant-Based Nutrition comprising of fruits and vegetables, plant-based proteins, whole-grain carbohydrates was discussed in detail with the patient.   A list for source of those nutrients were also provided to the patient.  Patient will use only water or unsweetened tea for hydration. B.  The need to stay away from risky substances including alcohol, smoking; obtaining 7 to 9 hours of restorative sleep, at least 150 minutes of moderate intensity exercise weekly, the importance of healthy social connections,  and stress management techniques were discussed. C.  A full color page of  Calorie density of various food groups per pound showing examples of each food groups was provided to the patient.  Handouts Provided Include  Lifestyle medicine Plant based meal plan Meal Plan Card-spanish Diabetes and You Spanish S/s of hyper/hypoglycemia-spanish  Learning Style & Readiness for Change Teaching method utilized: Visual & Auditory  Demonstrated degree of understanding via: Teach Back  Barriers to learning/adherence to lifestyle change: Limited vision  Goals Established by Pt Goals  Eat three meals per day at times discussed Focus on Whole Food Plant based foods Increase fruits, vegetebles and whole grains Avoid processed meats and junk foods Drink only water Walk 30 minutes with someone 3-4 times per week Test BS twice a day Don't skip meals Eat 2 carb choices per meals Don't do Adkins/NO Carb diet. Get A1C down to 7%   MONITORING & EVALUATION Dietary intake, weekly physical activity, and blood sugars in 1 month.  Next Steps  Patient is to work on plant based  meals.

## 2021-09-21 NOTE — Patient Instructions (Signed)
Goals  Dont not skip meals Eat 2 carb choices per meal to prevent low blood sugars Walk 30 minutes 5 times per week. Get A1C down to 6.5% without low blood sugars. Call MD if BS are less than 70's or having low blood sugars symptoms.

## 2021-09-21 NOTE — Progress Notes (Signed)
Endocrinology Consult Note       09/21/2021, 8:36 AM   Subjective:    Patient ID: Amanda Zamora, female    DOB: 08-03-1960.  Amanda Zamora is being seen in consultation for management of currently uncontrolled symptomatic diabetes requested by  Soyla Dryer, PA-C.   Past Medical History:  Diagnosis Date   Detached retina    GERD (gastroesophageal reflux disease)    History of pyelonephritis    PONV (postoperative nausea and vomiting)    Type 2 diabetes mellitus ([place])     Past Surgical History:  Procedure Laterality Date   CHOLECYSTECTOMY N/A 12/23/2019   Procedure: LAPAROSCOPIC CHOLECYSTECTOMY;  Surgeon: Virl Cagey, MD;  Location: [facility];  Service: General;  Laterality: N/A;   DENTAL SURGERY  06/01/2021   Removed 5 teeth   EYE SURGERY Right    retina   LAPAROSCOPIC APPENDECTOMY N/A 07/05/2020   Procedure: APPENDECTOMY LAPAROSCOPIC;  Surgeon: Aviva Signs, MD;  Location: [facility];  Service: General;  Laterality: N/A;   TUBAL LIGATION      Social History   Socioeconomic History   Marital status: Single    Spouse name: Not on file   Number of children: Not on file   Years of education: Not on file   Highest education level: Not on file  Occupational History   Not on file  Tobacco Use   Smoking status: Never   Smokeless tobacco: Never  Vaping Use   Vaping Use: Never used  Substance and Sexual Activity   Alcohol use: Not Currently    Comment: previously would drink occ. beer last drink 06/2017   Drug use: No   Sexual activity: Yes    Birth control/protection: None  Other Topics Concern   Not on file  Social History Narrative   Not on file   Social Determinants of Health   Financial Resource Strain: Not on file  Food Insecurity: Not on file  Transportation Needs: Not on file  Physical Activity: Not on file  Stress: Not on file  Social Connections: Not on file    Family  History  Problem Relation Age of Onset   Breast cancer Sister    Diabetes Sister     Outpatient Encounter Medications as of 09/21/2021  Medication Sig   multivitamin-iron-minerals-folic acid (CENTRUM) chewable tablet Chew 1 tablet by mouth daily.   amLODipine (NORVASC) 10 MG tablet Take 1 tablet (10 mg total) by mouth daily.   atorvastatin (LIPITOR) 80 MG tablet Tome una tableta por boca al dormir   cyclobenzaprine (FLEXERIL) 10 MG tablet TAKE 1 TABLET BY MOUTH TWICE DAILY AS NEEDED FOR MUSCLE SPASM   diclofenac Sodium (VOLTAREN) 1 % GEL Apply 2 g topically 4 (four) times daily.   gabapentin (NEURONTIN) 300 MG capsule Tome una tableta por boca al dormir   insulin NPH-regular Human (NOVOLIN 70/30 RELION) (70-30) 100 UNIT/ML injection 20 units with breakfast and 20 units with supper if glucose is above 90 and she is eating.   losartan (COZAAR) 100 MG tablet TAKE 1 Tablet BY MOUTH ONCE DAILY   metFORMIN (GLUCOPHAGE) 1000 MG tablet TAKE 1 TABLET BY MOUTH TWICE DAILY WITH A MEAL.   [employer]  tableta por boca dos veces diarias con comida   Omega-3 Fatty Acids (FISH OIL) 1000 MG CAPS Take 1 capsule (1,000 mg total) by mouth in the morning and at bedtime.   omeprazole (PRILOSEC) 20 MG capsule Take 1 capsule (20 mg total) by mouth daily. Tome una tableta por boca diaria   [DISCONTINUED] insulin NPH-regular Human (NOVOLIN 70/30 RELION) (70-30) 100 UNIT/ML injection 50 units with breakfast and 40 units with supper if glucose is above 90 and she is eating.   No facility-administered encounter medications on file as of 09/21/2021.    ALLERGIES: No Known Allergies  VACCINATION STATUS: Immunization History  Administered Date(s) Administered   Influenza Split 11/05/2014   Influenza-Unspecified 12/02/2018   Moderna Sars-Covid-2 Vaccination 05/28/2019, 06/27/2019   Tdap 09/30/2019    Diabetes She presents for her follow-up diabetic visit. She has type 2 diabetes mellitus. Onset time: diagnosed at  approx age of 47. Her disease course has been improving. Hypoglycemia symptoms include nervousness/anxiousness, sweats and tremors. Associated symptoms include fatigue. Pertinent negatives for diabetes include no polydipsia, no polyuria and no weight loss. There are no hypoglycemic complications. Symptoms are improving. Diabetic complications include peripheral neuropathy and retinopathy. Risk factors for coronary artery disease include diabetes mellitus, dyslipidemia, family history, obesity, hypertension and sedentary lifestyle. Current diabetic treatment includes insulin injections and oral agent (monotherapy). She is compliant with treatment most of the time. Her weight is decreasing steadily. She is following a generally healthy diet. When asked about meal planning, she reported none. She has not had a previous visit with a dietitian. She participates in exercise intermittently. Her home blood glucose trend is decreasing steadily. Her overall blood glucose range is 130-140 mg/dl. (She presents today, accompanied by her daughter and spanish interpreter, with her logs showing at target glycemic profile overall.  Her POCT A1c today is 7.7%, improving from last visit of 9.5%.  She notes she has been missing opportunities to inject her insulin due to levels being borderline low.  ) An ACE inhibitor/angiotensin II receptor blocker is being taken. She does not see a podiatrist.Eye exam is current.  Hypertension This is a chronic problem. The current episode started more than 1 year ago. The problem has been resolved since onset. The problem is controlled. Associated symptoms include sweats. There are no associated agents to hypertension. Risk factors for coronary artery disease include diabetes mellitus, dyslipidemia, family history, obesity and sedentary lifestyle. Past treatments include calcium channel blockers and angiotensin blockers. The current treatment provides moderate improvement. Compliance problems  include diet and exercise.  Hypertensive end-organ damage includes retinopathy.  Hyperlipidemia This is a chronic problem. The current episode started more than 1 year ago. The problem is uncontrolled. Recent lipid tests were reviewed and are variable. Exacerbating diseases include diabetes and obesity. Factors aggravating her hyperlipidemia include fatty foods. Current antihyperlipidemic treatment includes statins. The current treatment provides moderate improvement of lipids. Compliance problems include adherence to diet and adherence to exercise.  Risk factors for coronary artery disease include diabetes mellitus, dyslipidemia, family history, obesity, hypertension and a sedentary lifestyle.     Review of systems  Constitutional: + steadily decreasing body weight, current Body mass index is 32.17 kg/m., no fatigue, no subjective hyperthermia, no subjective hypothermia Eyes: + blurry vision- hx retinopathy, no xerophthalmia ENT: no sore throat, no nodules palpated in throat, no dysphagia/odynophagia, no hoarseness Cardiovascular: no chest pain, no shortness of breath, no palpitations, no leg swelling Respiratory: no cough, no shortness of breath Gastrointestinal: no nausea/vomiting/diarrhea Musculoskeletal: +  muscle/joint aches- mostly left side and back- sees PCP for this- taking Flexiril with no relief Skin: no rashes, no hyperemia Neurological: no tremors, no numbness, no tingling, no dizziness Psychiatric: no depression, no anxiety  Objective:     BP 127/77   Pulse 62   Ht 5' 0.05" (1.525 m)   Wt 165 lb (74.8 kg)   LMP 02/15/2011 (Approximate)   BMI 32.17 kg/m   Wt Readings from Last 3 Encounters:  09/21/21 165 lb (74.8 kg)  07/06/21 170 lb (77.1 kg)  06/08/21 174 lb (78.9 kg)     BP Readings from Last 3 Encounters:  09/21/21 127/77  07/06/21 122/62  06/08/21 126/71      Physical Exam- Limited  Constitutional:  Body mass index is 32.17 kg/m. , not in acute  distress, normal state of mind Eyes:  EOMI, no exophthalmos Neck: Supple Cardiovascular: RRR, no murmurs, rubs, or gallops, no edema Respiratory: Adequate breathing efforts, no crackles, rales, rhonchi, or wheezing Musculoskeletal: no gross deformities, strength intact in all four extremities, no gross restriction of joint movements Skin:  no rashes, no hyperemia Neurological: no tremor with outstretched hands    CMP ( most recent) CMP     Component Value Date/Time   NA 139 06/21/2021 0854   K 4.5 06/21/2021 0854   CL 104 06/21/2021 0854   CO2 27 06/21/2021 0854   GLUCOSE 158 (H) 06/21/2021 0854   BUN 19 06/21/2021 0854   CREATININE 0.86 06/21/2021 0854   CREATININE 0.94 11/22/2014 1123   CALCIUM 9.1 06/21/2021 0854   PROT 7.7 06/21/2021 0854   ALBUMIN 3.7 06/21/2021 0854   AST 19 06/21/2021 0854   ALT 16 06/21/2021 0854   ALKPHOS 85 06/21/2021 0854   BILITOT 0.9 06/21/2021 0854   GFRNONAA >60 06/21/2021 0854   GFRAA >60 09/26/2019 1112     Diabetic Labs (most recent): Lab Results  Component Value Date   HGBA1C 7.7 09/21/2021   HGBA1C 9.5 (H) 04/02/2021   HGBA1C 10.3 (H) 10/16/2020   MICROALBUR 3.3 (H) 10/16/2020   MICROALBUR 12.9 (H) 09/26/2019   MICROALBUR 9.4 (H) 08/01/2018     Lipid Panel ( most recent) Lipid Panel     Component Value Date/Time   CHOL 124 06/21/2021 0854   TRIG 252 (H) 06/21/2021 0854   HDL 37 (L) 06/21/2021 0854   CHOLHDL 3.4 06/21/2021 0854   VLDL 50 (H) 06/21/2021 0854   LDLCALC 37 06/21/2021 0854               Assessment & Plan:   1) Type 2 diabetes mellitus with hyperglycemia, with long-term current use of insulin ([place])  She presents today, accompanied by her daughter and spanish interpreter Tim Lair), with her logs showing at target glycemic profile overall.  Her POCT A1c today is 7.7%, improving from last visit of 9.5%.  She notes she has been missing opportunities to inject her insulin due to levels being borderline low.     - Amanda Zamora has currently uncontrolled symptomatic type 2 DM since 61 years of age.   -Recent labs reviewed.  - I had a long discussion with her about the progressive nature of diabetes and the pathology behind its complications. -her diabetes is complicated by retinopathy, neuropathy and she remains at a high risk for more acute and chronic complications which include CAD, CVA, CKD, retinopathy, and neuropathy. These are all discussed in detail with her.  - Nutritional counseling repeated at each appointment due to patients tendency to  fall back in to old habits.  - The patient admits there is a room for improvement in their diet and drink choices. -  Suggestion is made for the patient to avoid simple carbohydrates from their diet including Cakes, Sweet Desserts / Pastries, Ice Cream, Soda (diet and regular), Sweet Tea, Candies, Chips, Cookies, Sweet Pastries, Store Bought Juices, Alcohol in Excess of 1-2 drinks a day, Artificial Sweeteners, Coffee Creamer, and "Sugar-free" Products. This will help patient to have stable blood glucose profile and potentially avoid unintended weight gain.   - I encouraged the patient to switch to unprocessed or minimally processed complex starch and increased protein intake (animal or plant source), fruits, and vegetables.   - Patient is advised to stick to a routine mealtimes to eat 3 meals a day and avoid unnecessary snacks (to snack only to correct hypoglycemia).  -She is seeing Jearld Fenton, RDE after my visit today for diabetes education.  - I have approached her with the following individualized plan to manage her diabetes and patient agrees:   -She is advised to continue her Metformin 1000 mg po twice daily with meals and adjust her dose of NPH 70/30 to 20 units twice daily with meals if glucose is above 90 and she is eating.     -she is encouraged to continue monitoring blood glucose 3 times daily, before injecting insulin at breakfast and  supper, and before bed, and to call the clinic if her readings are less than 70 or above 300 for 3 tests in a row.  - she is warned not to take insulin without proper monitoring per orders. - Adjustment parameters are given to her for hypo and hyperglycemia in writing.  - she will be considered for incretin therapy as appropriate next visit.  - Specific targets for  A1c; LDL, HDL, and Triglycerides were discussed with the patient.  2) Blood Pressure /Hypertension:  her blood pressure is controlled to target.   she is advised to continue her current medications including Norvasc 10 mg p.o. daily with breakfast and Losartan 100 mg po daily.  3) Lipids/Hyperlipidemia:    Review of her recent lipid panel from 04/02/21 showed controlled LDL at 45 and slightly elevated triglycerides of 179 .  she is advised to continue Lipitor 80 mg daily at bedtime.  Side effects and precautions discussed with her.  4)  Weight/Diet:  her Body mass index is 32.17 kg/m.  -  clearly complicating her diabetes care.   she is a candidate for weight loss. I discussed with her the fact that loss of 5 - 10% of her  current body weight will have the most impact on her diabetes management.  Exercise, and detailed carbohydrates information provided  -  detailed on discharge instructions.  5) Chronic Care/Health Maintenance: -she is on ACEI/ARB and Statin medications and is encouraged to initiate and continue to follow up with Ophthalmology, Dentist, Podiatrist at least yearly or according to recommendations, and advised to stay away from smoking. I have recommended yearly flu vaccine and pneumonia vaccine at least every 5 years; moderate intensity exercise for up to 150 minutes weekly; and sleep for at least 7 hours a day.  - she is advised to maintain close follow up with Soyla Dryer, PA-C for primary care needs, as well as her other providers for optimal and coordinated care.     I spent 30 minutes in the care of the  patient today including review of labs from CMP, Lipids, Thyroid Function, Hematology (  current and previous including abstractions from other facilities); face-to-face time discussing  her blood glucose readings/logs, discussing hypoglycemia and hyperglycemia episodes and symptoms, medications doses, her options of short and long term treatment based on the latest standards of care / guidelines;  discussion about incorporating lifestyle medicine;  and documenting the encounter. Risk reduction counseling performed per USPSTF guidelines to reduce obesity and cardiovascular risk factors.     Please refer to Patient Instructions for Blood Glucose Monitoring and Insulin/Medications Dosing Guide"  in media tab for additional information. Please  also refer to " Patient Self Inventory" in the Media  tab for reviewed elements of pertinent patient history.  Amanda Zamora participated in the discussions, expressed understanding, and voiced agreement with the above plans.  All questions were answered to her satisfaction. she is encouraged to contact clinic should she have any questions or concerns prior to her return visit.     Follow up plan: - Return in about 3 months (around 12/22/2021) for Diabetes F/U with A1c in office, No previsit labs, Bring meter and logs.   Rayetta Pigg, [facility] [address] [place], [place] [address] Phone: [phone] Fax: [phone]  09/21/2021, 8:36 AM

## 2021-09-22 ENCOUNTER — Encounter: Payer: Self-pay | Admitting: Nutrition

## 2021-09-28 ENCOUNTER — Ambulatory Visit: Payer: Self-pay | Admitting: Physician Assistant

## 2021-09-28 ENCOUNTER — Encounter: Payer: Self-pay | Admitting: Physician Assistant

## 2021-09-28 VITALS — BP 118/76 | HR 65 | Temp 96.5°F | Ht 60.5 in | Wt 162.0 lb

## 2021-09-28 DIAGNOSIS — E11319 Type 2 diabetes mellitus with unspecified diabetic retinopathy without macular edema: Secondary | ICD-10-CM

## 2021-09-28 DIAGNOSIS — I10 Essential (primary) hypertension: Secondary | ICD-10-CM

## 2021-09-28 DIAGNOSIS — E1165 Type 2 diabetes mellitus with hyperglycemia: Secondary | ICD-10-CM

## 2021-09-28 DIAGNOSIS — E669 Obesity, unspecified: Secondary | ICD-10-CM

## 2021-09-28 DIAGNOSIS — R1032 Left lower quadrant pain: Secondary | ICD-10-CM

## 2021-09-28 DIAGNOSIS — R109 Unspecified abdominal pain: Secondary | ICD-10-CM

## 2021-09-28 DIAGNOSIS — E785 Hyperlipidemia, unspecified: Secondary | ICD-10-CM

## 2021-09-28 DIAGNOSIS — Z789 Other specified health status: Secondary | ICD-10-CM

## 2021-09-28 LAB — POCT URINALYSIS DIPSTICK
Bilirubin, UA: NEGATIVE
Glucose, UA: NEGATIVE
Ketones, UA: NEGATIVE
Nitrite, UA: NEGATIVE
Protein, UA: NEGATIVE
Spec Grav, UA: 1.015 (ref 1.010–1.025)
Urobilinogen, UA: 0.2 E.U./dL
pH, UA: 5.5 (ref 5.0–8.0)

## 2021-09-28 MED ORDER — AMLODIPINE BESYLATE 10 MG PO TABS: 10.0000 mg | ORAL_TABLET | Freq: Every day | ORAL | 0 refills | Status: DC

## 2021-09-28 MED ORDER — ATORVASTATIN CALCIUM 80 MG PO TABS: ORAL_TABLET | ORAL | 1 refills | Status: DC

## 2021-09-28 MED ORDER — METFORMIN HCL 1000 MG PO TABS
ORAL_TABLET | ORAL | 0 refills | Status: DC
Start: 2021-09-28 — End: 2021-12-22

## 2021-09-28 MED ORDER — LOSARTAN POTASSIUM 100 MG PO TABS: 100.0000 mg | ORAL_TABLET | Freq: Every day | ORAL | 1 refills | Status: DC

## 2021-09-28 NOTE — Progress Notes (Unsigned)
BP 118/76   Pulse 65   Temp (!) 96.5 F (35.8 C)   Ht 5' 0.5" (1.537 m)   Wt 162 lb (73.5 kg)   LMP 02/15/2011 (Approximate)   SpO2 98%   BMI 31.12 kg/m    Subjective:    Patient ID: Amanda Zamora, female    DOB: 01/19/1961, 61 y.o.   MRN: [id number]  HPI: Amanda Zamora is a 61 y.o. female presenting on 09/28/2021 for Hypertension and Hyperlipidemia   HPI  Chief Complaint  Patient presents with   Hypertension   Hyperlipidemia      Pt c/o pain LLQ and radiates around to LL back.  It comes and goes.  It lasts for several days then goes away.  No pain now.  Going now for 2 week.  BMs normal and regular.     No urinary burning  Pt last seen by eye dr at [facility] on 03/25/2021-  was scheduled to retun to retina clinic 04/14/21 but she didn't make it to that appt.     No menses about 10 years.       Relevant past medical, surgical, family and social history reviewed and updated as indicated. Interim medical history since our last visit reviewed. Allergies and medications reviewed and updated.   Current Outpatient Medications:    amLODipine (NORVASC) 10 MG tablet, Take 1 tablet (10 mg total) by mouth daily., Disp: 90 tablet, Rfl: 0   atorvastatin (LIPITOR) 80 MG tablet, Tome una tableta por boca al dormir, Disp: 90 tablet, Rfl: 1   cyclobenzaprine (FLEXERIL) 10 MG tablet, TAKE 1 TABLET BY MOUTH TWICE DAILY AS NEEDED FOR MUSCLE SPASM, Disp: 20 tablet, Rfl: 0   gabapentin (NEURONTIN) 300 MG capsule, Tome una tableta por boca al dormir, Disp: 30 capsule, Rfl: 3   insulin NPH-regular Human (NOVOLIN 70/30 RELION) (70-30) 100 UNIT/ML injection, 20 units with breakfast and 20 units with supper if glucose is above 90 and she is eating., Disp: 10 mL, Rfl: 0   losartan (COZAAR) 100 MG tablet, TAKE 1 Tablet BY MOUTH ONCE DAILY, Disp: 90 tablet, Rfl: 1   metFORMIN (GLUCOPHAGE) 1000 MG tablet, TAKE 1 TABLET BY MOUTH TWICE DAILY WITH A MEAL.   Tome una tableta por boca dos veces diarias  con comida, Disp: 180 tablet, Rfl: 0   multivitamin-iron-minerals-folic acid (CENTRUM) chewable tablet, Chew 1 tablet by mouth daily., Disp: , Rfl:    Omega-3 Fatty Acids (FISH OIL) 1000 MG CAPS, Take 1 capsule (1,000 mg total) by mouth in the morning and at bedtime., Disp: 60 capsule, Rfl: 2   omeprazole (PRILOSEC) 20 MG capsule, Take 1 capsule (20 mg total) by mouth daily. Tome una tableta por boca diaria (Patient taking differently: Take 20 mg by mouth daily as needed. Tome una tableta por boca diaria), Disp: 90 capsule, Rfl: 1   diclofenac Sodium (VOLTAREN) 1 % GEL, Apply 2 g topically 4 (four) times daily. (Patient not taking: Reported on 09/28/2021), Disp: 20 g, Rfl: 0    Review of Systems  Per HPI unless specifically indicated above     Objective:    BP 118/76   Pulse 65   Temp (!) 96.5 F (35.8 C)   Ht 5' 0.5" (1.537 m)   Wt 162 lb (73.5 kg)   LMP 02/15/2011 (Approximate)   SpO2 98%   BMI 31.12 kg/m   Wt Readings from Last 3 Encounters:  09/28/21 162 lb (73.5 kg)  09/21/21 165 lb (74.8 kg)  09/21/21 165  lb (74.8 kg)    Physical Exam Vitals reviewed.  Constitutional:      General: She is not in acute distress.    Appearance: She is well-developed. She is obese. She is not toxic-appearing.  HENT:     Head: Normocephalic and atraumatic.  Cardiovascular:     Rate and Rhythm: Normal rate and regular rhythm.  Pulmonary:     Effort: Pulmonary effort is normal.     Breath sounds: Normal breath sounds.  Abdominal:     General: Bowel sounds are normal.     Palpations: Abdomen is soft. There is no mass.     Tenderness: There is no abdominal tenderness.  Musculoskeletal:     Cervical back: Neck supple.     Right lower leg: No edema.     Left lower leg: No edema.  Lymphadenopathy:     Cervical: No cervical adenopathy.  Skin:    General: Skin is warm and dry.  Neurological:     Mental Status: She is alert and oriented to person, place, and time.  Psychiatric:         Behavior: Behavior normal.     Results for orders placed or performed during the hospital encounter of 09/21/21  Lipid panel  Result Value Ref Range   Cholesterol 97 0 - 200 mg/dL   Triglycerides 91 <150 mg/dL   HDL 34 (L) >40 mg/dL   Total CHOL/HDL Ratio 2.9 RATIO   VLDL 18 0 - 40 mg/dL   LDL Cholesterol 45 0 - 99 mg/dL  Comprehensive metabolic panel  Result Value Ref Range   Sodium 138 135 - 145 mmol/L   Potassium 5.0 3.5 - 5.1 mmol/L   Chloride 103 98 - 111 mmol/L   CO2 27 22 - 32 mmol/L   Glucose, Bld 164 (H) 70 - 99 mg/dL   BUN 18 6 - 20 mg/dL   Creatinine, Ser 0.97 0.44 - 1.00 mg/dL   Calcium 9.1 8.9 - 10.3 mg/dL   Total Protein 7.8 6.5 - 8.1 g/dL   Albumin 3.9 3.5 - 5.0 g/dL   AST 20 15 - 41 U/L   ALT 14 0 - 44 U/L   Alkaline Phosphatase 77 38 - 126 U/L   Total Bilirubin 0.9 0.3 - 1.2 mg/dL   GFR, Estimated >60 >60 mL/min   Anion gap 8 5 - 15      Assessment & Plan:    Encounter Diagnoses  Name Primary?   Essential hypertension Yes   Hyperlipidemia, unspecified hyperlipidemia type    Uncontrolled type 2 diabetes mellitus with hyperglycemia ([place])    Not proficient in English language    LLQ abdominal pain    Flank pain    Diabetic retinopathy associated with type 2 diabetes mellitus, macular edema presence unspecified, unspecified laterality, unspecified retinopathy severity ([facility])    Obesity, unspecified classification, unspecified obesity type, unspecified whether serious comorbidity present      -reviewed labs with pt  -pt to Continue with endocrinology for DM mgt - Mammogram was ordered at 07/06/21 appt but hasn't been scheduled yet (due to funding program issue) -DM Foot exam was updated -pt is given FIT test for colon cancer screening -She has appt to follow up with eye dotor this month or next- she can't remember which but has it written down at home --urine will be sent for culture -pt to continue current bp and cholesterol medications -pt to  follow up 3 months.  She is to contact office sooner prn

## 2021-09-29 ENCOUNTER — Other Ambulatory Visit: Payer: Self-pay | Admitting: Physician Assistant

## 2021-09-29 MED ORDER — CEPHALEXIN 500 MG PO CAPS: 500.0000 mg | ORAL_CAPSULE | Freq: Four times a day (QID) | ORAL | 0 refills | Status: AC

## 2021-09-30 LAB — URINE CULTURE: Culture: 10000 — AB

## 2021-10-13 ENCOUNTER — Telehealth: Payer: Self-pay

## 2021-10-13 ENCOUNTER — Telehealth: Payer: Self-pay | Admitting: Student

## 2021-10-13 ENCOUNTER — Other Ambulatory Visit: Payer: Self-pay | Admitting: Physician Assistant

## 2021-10-13 DIAGNOSIS — Z1211 Encounter for screening for malignant neoplasm of colon: Secondary | ICD-10-CM

## 2021-10-13 LAB — POC FIT TEST STOOL: Fecal Occult Blood: NEGATIVE

## 2021-10-13 NOTE — Telephone Encounter (Signed)
LPN called and spoke with Mayra, pt's daughter and notified her that P. Elgie Congo has attempted contacting patient several times to schedule pt's mammo, but has been unsuccessful. LPN gave Mayra, P. Bass's direct number to call to get pt scheduled. Pt verbalized understanding.

## 2021-10-13 NOTE — Telephone Encounter (Signed)
Patient's daughter called on behalf of patient to schedule [facility] appointment. She will have her mother call back on Tuesday of next week and speak with the interpreter in the office to schedule appointment

## 2021-10-28 IMAGING — MG MM DIGITAL DIAGNOSTIC UNILAT*L* W/ TOMO W/ CAD
6 series · 6 of 14 positions shown · non-contrast
Comparison: August 28, 2018 and earlier priors

CLINICAL DATA: 58-year-old patient presents for six-month follow-up
of probably benign calcifications in the left breast.

EXAM:
DIGITAL DIAGNOSTIC UNILATERAL LEFT MAMMOGRAM WITH CAD AND TOMO

[L CC]
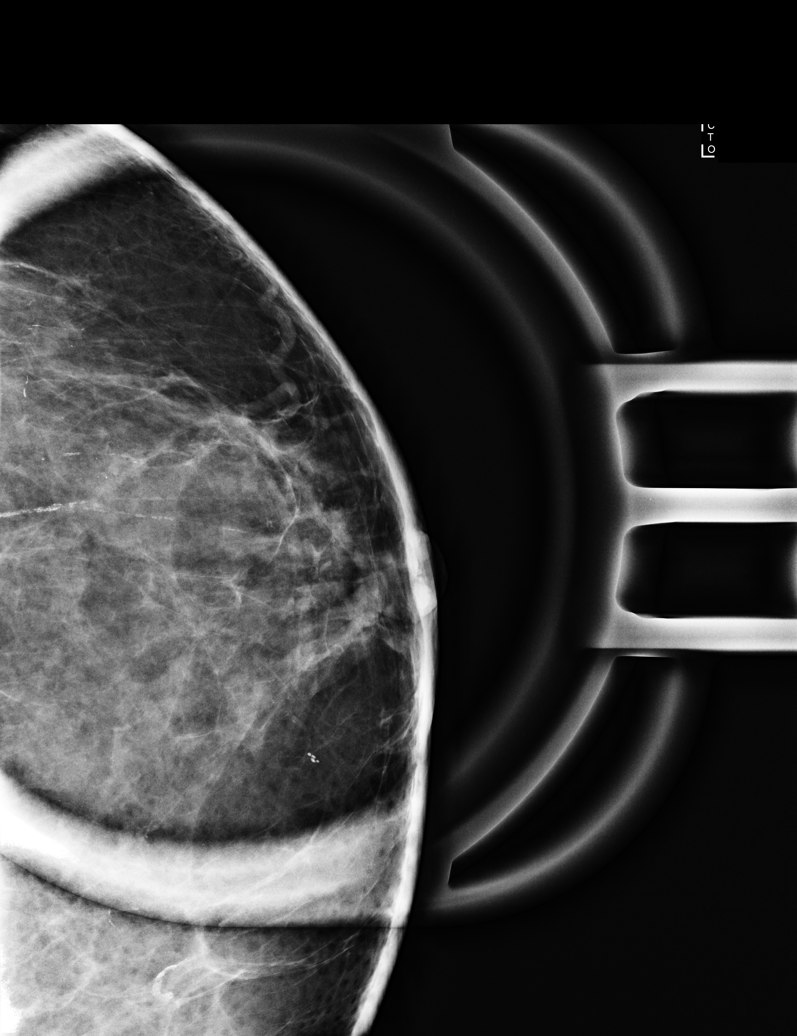

[L ML]
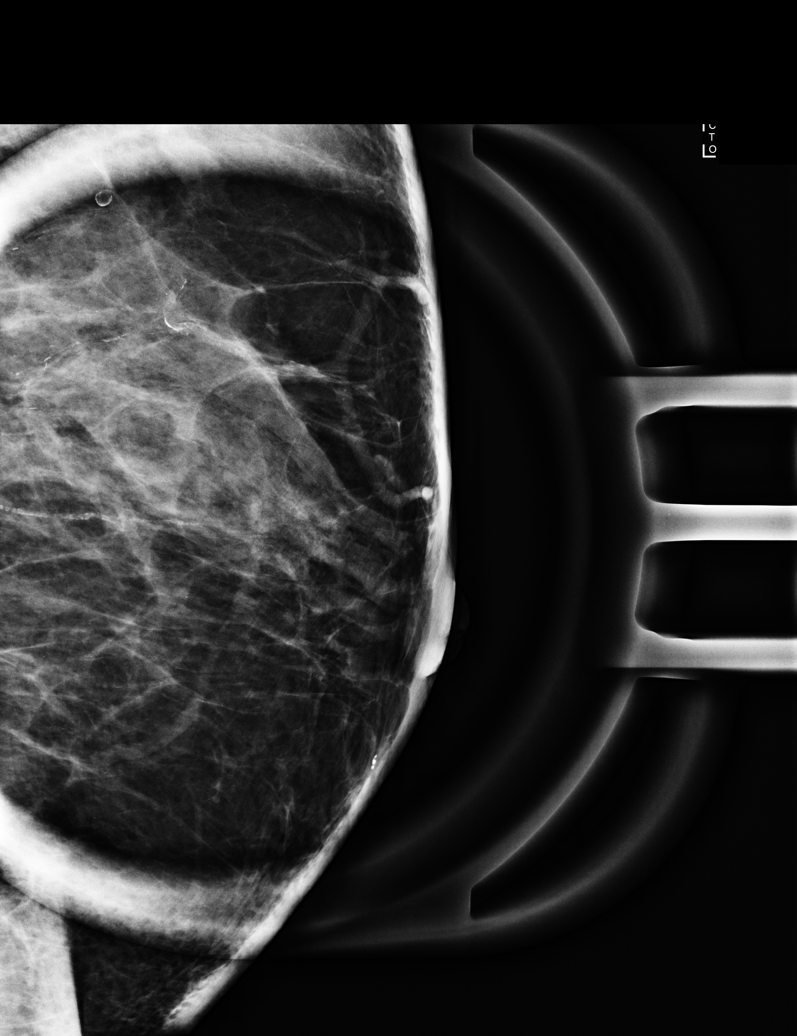

[L MLO synth-2D]
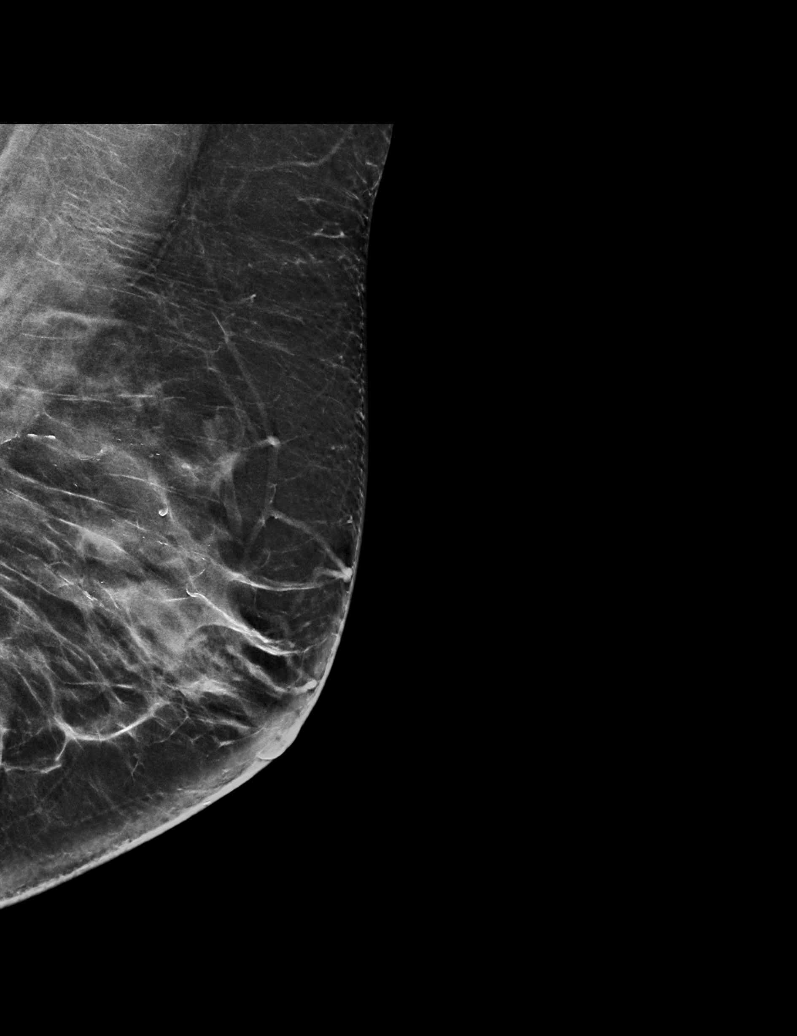

[L CC synth-2D]
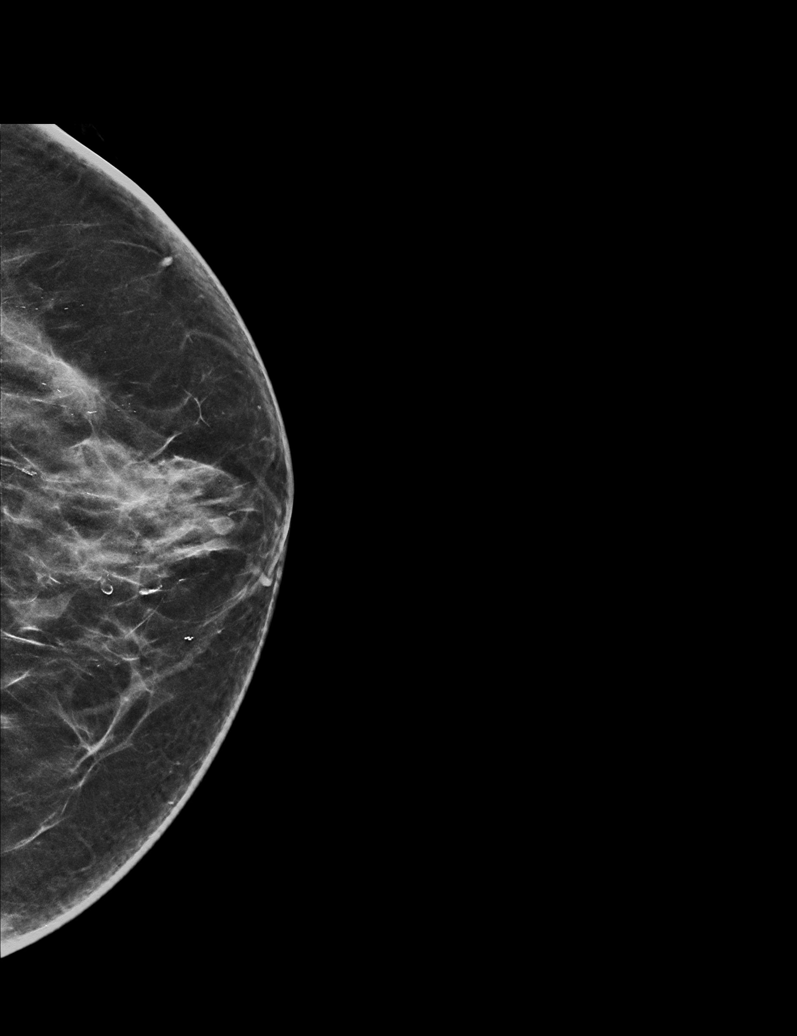

[L MLO tomo · tomo slice 35/69.0]
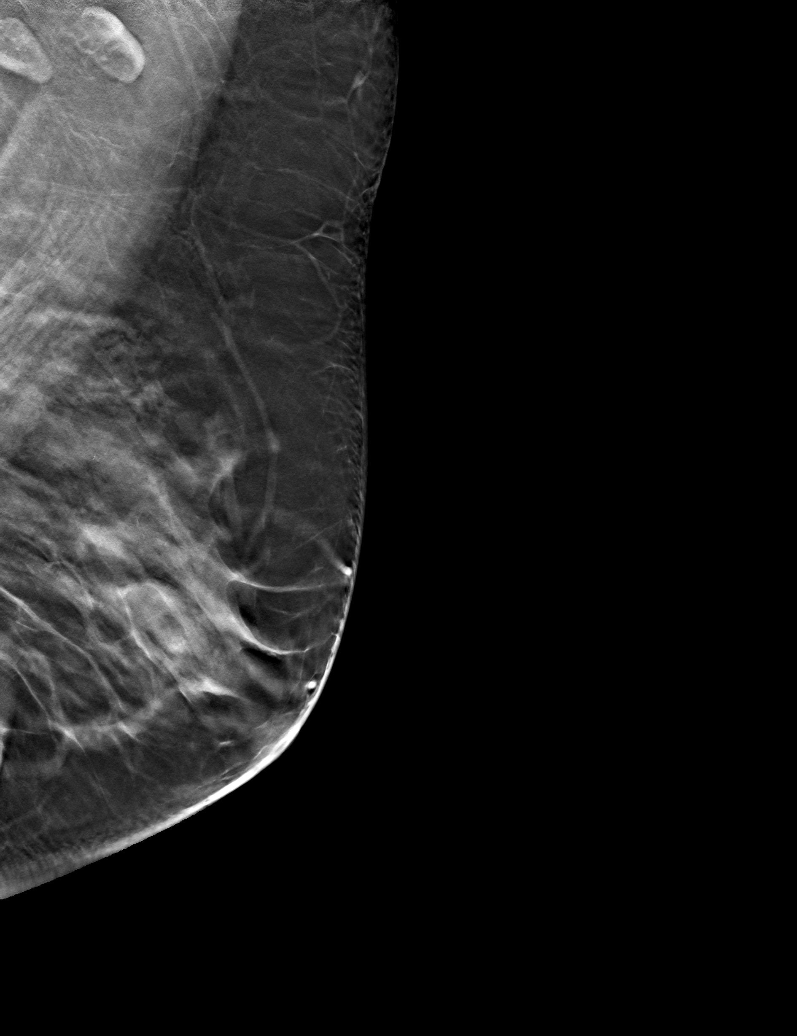

[L CC tomo · tomo slice 31/61.0]
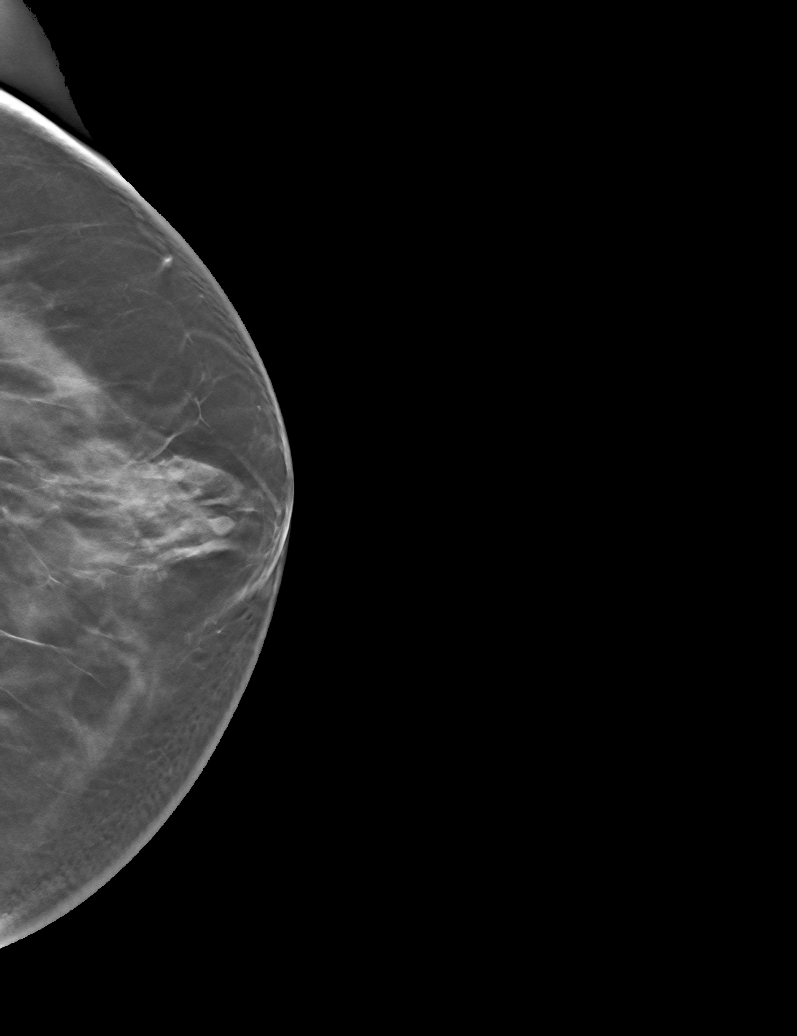

[6 of 14 positions shown; findings below may reference images not displayed]

ACR Breast Density Category c: The breast tissue is heterogeneously
dense, which may obscure small masses.
FINDINGS: Magnification views of the upper outer left breast show a stable
appearance of tubular calcifications most consistent with
atherosclerotic vascular calcifications. Benign scattered round and
punctate calcifications are noted. No malignant type calcifications
identified. There is no mass or distortion in the left breast.

Mammographic images were processed with CAD.
IMPRESSION: Stable benign-appearing left breast calcifications.

RECOMMENDATION:
Bilateral diagnostic mammogram is recommended in July 2019 to
complete a 1 year follow-up.

I have discussed the findings and recommendations with the patient.
If applicable, a reminder letter will be sent to the patient
regarding the next appointment.

BI-RADS CATEGORY  3: Probably benign.

## 2021-11-15 ENCOUNTER — Other Ambulatory Visit: Payer: Self-pay | Admitting: Physician Assistant

## 2021-11-24 ENCOUNTER — Ambulatory Visit (HOSPITAL_COMMUNITY)
Admission: RE | Admit: 2021-11-24 | Discharge: 2021-11-24 | Disposition: A | Discharge status: Home / Self Care | Payer: Self-pay | Source: Ambulatory Visit | Attending: Physician Assistant | Admitting: Physician Assistant

## 2021-11-24 ENCOUNTER — Other Ambulatory Visit: Payer: Self-pay | Admitting: Physician Assistant

## 2021-11-24 ENCOUNTER — Encounter (HOSPITAL_COMMUNITY): Payer: Self-pay

## 2021-11-24 ENCOUNTER — Ambulatory Visit: Payer: Self-pay | Admitting: Physician Assistant

## 2021-11-24 ENCOUNTER — Encounter: Payer: Self-pay | Admitting: Physician Assistant

## 2021-11-24 VITALS — BP 110/70 | HR 67 | Temp 97.6°F | Ht 60.5 in | Wt 159.0 lb

## 2021-11-24 DIAGNOSIS — R109 Unspecified abdominal pain: Secondary | ICD-10-CM

## 2021-11-24 DIAGNOSIS — R1032 Left lower quadrant pain: Secondary | ICD-10-CM

## 2021-11-24 DIAGNOSIS — Z789 Other specified health status: Secondary | ICD-10-CM

## 2021-11-24 DIAGNOSIS — E1139 Type 2 diabetes mellitus with other diabetic ophthalmic complication: Secondary | ICD-10-CM | POA: Insufficient documentation

## 2021-11-24 DIAGNOSIS — R935 Abnormal findings on diagnostic imaging of other abdominal regions, including retroperitoneum: Secondary | ICD-10-CM

## 2021-11-24 LAB — POCT URINALYSIS DIPSTICK
Bilirubin, UA: NEGATIVE
Glucose, UA: NEGATIVE
Ketones, UA: NEGATIVE
Leukocytes, UA: NEGATIVE
Nitrite, UA: NEGATIVE
Protein, UA: NEGATIVE
Spec Grav, UA: 1.015 (ref 1.010–1.025)
Urobilinogen, UA: 0.2 E.U./dL
pH, UA: 5.5 (ref 5.0–8.0)

## 2021-11-24 LAB — POCT I-STAT CREATININE: Creatinine, Ser: 0.9 mg/dL (ref 0.44–1.00)

## 2021-11-24 MED ORDER — IOHEXOL 9 MG/ML PO SOLN
ORAL | Status: AC
  Filled 2021-11-24: qty 500

## 2021-11-24 MED ORDER — IOHEXOL 300 MG/ML  SOLN
100.0000 mL | Freq: Once | INTRAMUSCULAR | Status: AC | PRN
  Administered 2021-11-24: 100 mL via INTRAVENOUS

## 2021-11-24 MED ORDER — SODIUM CHLORIDE (PF) 0.9 % IJ SOLN
INTRAMUSCULAR | Status: AC
  Filled 2021-11-24: qty 50

## 2021-11-24 NOTE — Progress Notes (Signed)
BP 110/70   Pulse 67   Temp 97.6 F (36.4 C)   Ht 5' 0.5" (1.537 m)   Wt 159 lb (72.1 kg)   LMP 02/15/2011 (Approximate)   SpO2 99%   BMI 30.54 kg/m    Subjective:    Patient ID: Amanda Zamora, female    DOB: 04/30/1960, 61 y.o.   MRN: [id number]  HPI: Amanda Zamora is a 61 y.o. female presenting on 11/24/2021 for Flank Pain (Pt states she has been with this since 09/2021 when she had a urine culture and took cephalaxin. Pt states she had some relief with flank pain. Pt states on Friday it got worse. Pt states she has back pain and abd pain when she urinates she has some relief. Pt states no pain or burning when urinating.)   HPI   Chief Complaint  Patient presents with   Flank Pain    Pt states she has been with this since 09/2021 when she had a urine culture and took cephalaxin. Pt states she had some relief with flank pain. Pt states on Friday it got worse. Pt states she has back pain and abd pain when she urinates she has some relief. Pt states no pain or burning when urinating.     LLQ abd pain radiating to back  LMP many years ago.  She is not sexually active  Her bs doing well. Last pap 2021- normal  She had appy last year.  She says she has no N/V/D.   She has had no fevers.      Relevant past medical, surgical, family and social history reviewed and updated as indicated. Interim medical history since our last visit reviewed. Allergies and medications reviewed and updated.    Current Outpatient Medications:    amLODipine (NORVASC) 10 MG tablet, Take 1 tablet (10 mg total) by mouth daily., Disp: 90 tablet, Rfl: 0   atorvastatin (LIPITOR) 80 MG tablet, Tome una tableta por boca al dormir, Disp: 90 tablet, Rfl: 1   cyclobenzaprine (FLEXERIL) 10 MG tablet, TAKE 1 TABLET BY MOUTH TWICE DAILY AS NEEDED FOR MUSCLE SPASM, Disp: 20 tablet, Rfl: 0   gabapentin (NEURONTIN) 300 MG capsule, Take 1 capsule by mouth at bedtime, Disp: 30 capsule, Rfl: 0   insulin  NPH-regular Human (NOVOLIN 70/30 RELION) (70-30) 100 UNIT/ML injection, 20 units with breakfast and 20 units with supper if glucose is above 90 and she is eating., Disp: 10 mL, Rfl: 0   losartan (COZAAR) 100 MG tablet, Take 1 tablet (100 mg total) by mouth daily., Disp: 90 tablet, Rfl: 1   metFORMIN (GLUCOPHAGE) 1000 MG tablet, TAKE 1 TABLET BY MOUTH TWICE DAILY WITH A MEAL.   Tome una tableta por boca dos veces diarias con comida, Disp: 180 tablet, Rfl: 0   multivitamin-iron-minerals-folic acid (CENTRUM) chewable tablet, Chew 1 tablet by mouth daily., Disp: , Rfl:    Omega-3 Fatty Acids (FISH OIL) 1000 MG CAPS, Take 1 capsule (1,000 mg total) by mouth in the morning and at bedtime., Disp: 60 capsule, Rfl: 2   omeprazole (PRILOSEC) 20 MG capsule, Take 1 capsule (20 mg total) by mouth daily. Tome una tableta por boca diaria (Patient taking differently: Take 20 mg by mouth daily as needed. Tome una tableta por boca diaria), Disp: 90 capsule, Rfl: 1   diclofenac Sodium (VOLTAREN) 1 % GEL, Apply 2 g topically 4 (four) times daily. (Patient not taking: Reported on 11/24/2021), Disp: 20 g, Rfl: 0    Review of Systems  Per HPI unless specifically indicated above     Objective:    BP 110/70   Pulse 67   Temp 97.6 F (36.4 C)   Ht 5' 0.5" (1.537 m)   Wt 159 lb (72.1 kg)   LMP 02/15/2011 (Approximate)   SpO2 99%   BMI 30.54 kg/m   Wt Readings from Last 3 Encounters:  11/24/21 159 lb (72.1 kg)  09/28/21 162 lb (73.5 kg)  09/21/21 165 lb (74.8 kg)    Physical Exam Vitals reviewed.  Constitutional:      General: She is not in acute distress.    Appearance: She is well-developed. She is not toxic-appearing.     Comments: Pt looks like she doesn't feel well.    HENT:     Head: Normocephalic and atraumatic.  Cardiovascular:     Rate and Rhythm: Normal rate and regular rhythm.  Pulmonary:     Effort: Pulmonary effort is normal.     Breath sounds: Normal breath sounds.  Abdominal:      General: Bowel sounds are normal.     Palpations: Abdomen is soft. There is no fluid wave, hepatomegaly, splenomegaly, mass or pulsatile mass.     Tenderness: There is abdominal tenderness in the left lower quadrant. There is left CVA tenderness. There is no right CVA tenderness, guarding or rebound.  Musculoskeletal:     Cervical back: Neck supple.  Lymphadenopathy:     Cervical: No cervical adenopathy.  Skin:    General: Skin is warm and dry.  Neurological:     Mental Status: She is alert and oriented to person, place, and time.  Psychiatric:        Behavior: Behavior normal.         Results for orders placed or performed in visit on 11/24/21  POCT Urinalysis Dipstick  Result Value Ref Range   Color, UA LIGHT YELLOW    Clarity, UA CLEAR    Glucose, UA Negative Negative   Bilirubin, UA NEG    Ketones, UA NEG    Spec Grav, UA 1.015 1.010 - 1.025   Blood, UA TRACE - LYSED    pH, UA 5.5 5.0 - 8.0   Protein, UA Negative Negative   Urobilinogen, UA 0.2 0.2 or 1.0 E.U./dL   Nitrite, UA NEG    Leukocytes, UA Negative Negative   Appearance     Odor        Assessment & Plan:    Encounter Diagnoses  Name Primary?   LLQ abdominal pain Yes   Flank pain    Left lower quadrant abdominal pain    Not proficient in English language       Pyelo vs diverticulitis  Pt is sent for CT to be done today.  She will be called with results and antibiotics will be sent in if appropriate.  Pt is given reading information on both pyelonephritis and diverticulitis per her request.  Pt understands and is in agreement with plan

## 2021-11-24 NOTE — Patient Instructions (Addendum)
[facility] [address], [place], [place] [address]  -----------------------------    Diverticulitis Diverticulitis  La diverticulitis es la infeccin o inflamacin de pequeas bolsas (divertculos) en el colon que se forman por una afeccin llamada diverticulosis. Los divertculos pueden atrapar las heces (materia fecal) y las bacterias, lo cual causa infeccin e inflamacin. La diverticulitis puede causar dolor intenso de estmago y [place]. Puede causar daos en los tejidos del colon, lo cual provoca sangrado u obstruccin. Los divertculos tambin pueden explotar (ruptura) y causar que las heces infectadas ingresen a otras reas del abdomen. Cules son las causas? Esta afeccin es causada por las heces que quedan atrapadas en los divertculos, lo cual permite que las bacterias crezcan en ellos. Esto causa inflamacin e infeccin. Qu incrementa el riesgo? Es ms probable que tenga esta afeccin si tiene diverticulosis. El riesgo aumenta si usted: Tiene sobrepeso u obesidad. No realiza suficiente actividad fsica. Bebe alcohol. Consume productos con tabaco. Lleva una dieta con gran cantidad de carnes rojas, como carne de vaca, cerdo o cordero. Lleva una dieta que no incluye la suficiente cantidad [place]. Los alimentos ricos en fibra incluyen frutas, verduras, frijoles, frutos secos y cereales integrales. Tiene ms de 40 aos. Cules son los signos o sntomas? Los sntomas de esta afeccin pueden incluir los siguientes: Dolor y sensibilidad en el abdomen. El dolor en general se siente del lado izquierdo del abdomen, pero puede sentirse en otras zonas. Cristy Hilts y escalofros. Nuseas. Vmitos. Calambres. Meteorismo. Cambios en los hbitos intestinales. Sangre en las heces. Cmo se diagnostica? Esta afeccin se diagnostica en funcin de lo siguiente: Sus antecedentes mdicos. Un examen fsico. Pruebas para descartar otros motivos que causen la afeccin. Estas  pruebas pueden incluir lo siguiente: Anlisis de sangre. Anlisis de [place]. Exploracin por tomografa computarizada (TC) del abdomen. Cmo se trata? La mayora de los casos de esta afeccin son leves y pueden tratarse Financial planner. El tratamiento puede incluir: Tomar analgsicos de [place]. Seguir una dieta lquida absoluta. Tomar antibiticos por va oral. Hacer reposo. Puede que los casos ms graves deban tratarse en el hospital. El tratamiento puede incluir: No comer ni beber nada. Tomar analgsicos recetados. Recibir antibiticos a travs de una va intravenosa (IV). Recibir lquidos y alimentos a travs de una va IV. Ciruga. Cuando la afeccin est controlada, el mdico puede recomendarle que se haga una colonoscopia. Se trata de un examen que se realiza para examinar todo el intestino grueso. Durante el examen, se introduce un tubo lubricado y flexible en el ano que luego se lleva hasta el recto, el colon y otras partes del intestino grueso. Mediante una colonoscopia, se puede determinar la gravedad de los divertculos y si hay algo ms que pueda estar causando los sntomas. Siga estas instrucciones en su casa: Medicamentos [employer]phi de venta libre y los recetados solamente como se lo haya indicado el mdico. Estos incluyen suplementos de [place], probiticos y laxantes. Si le recetaron un antibitico, tmelo como se lo haya indicado el mdico. No deje de tomar el antibitico aunque comience a sentirse mejor. Pregntele al mdico si el medicamento recetado le impide conducir o usar [place]. Comida y bebida  Siga las indicaciones del mdico acerca de si debe seguir una dieta lquida o de otro tipo. Cuando los sntomas mejoren, el mdico puede indicarle que modifique la dieta. Puede recomendarle que consuma una dieta con al menos 25 gramos (25 g) de fibra a diario. La fibra facilita la eliminacin de heces. Las fuentes  saludables de fibra incluyen: [place]. Una taza  contiene de 4 a 8 gramos de [place]. Frijoles y lentejas. Media taza contiene de 5 a 8 gramos de fibra. Verduras de [employer]. Una taza contiene 4 gramos de [place]. Evite comer carne roja. Instrucciones generales No consuma ningn producto que contenga nicotina o tabaco, como cigarrillos, cigarrillos electrnicos y tabaco de Higher education careers adviser. Si necesita ayuda para dejar de consumir estos productos, consulte al [employer]. Optometrist al menos 30 minutos de actividad fsica diaria, 3 veces por semana. El ejercicio debe tener la intensidad suficiente como para aumentar la frecuencia cardaca y Risk manager. Concurra a todas las visitas de seguimiento como se lo haya indicado el mdico. Esto es importante. Es posible que necesite someterse a Civil engineer, contracting. Comunquese con un mdico si: El dolor no mejora. Las deposiciones no se normalizan. Solicite ayuda de inmediato si: El Holiday representative. Los sntomas no mejoran con Dispensing optician. Los sntomas empeoran repentinamente. Tiene fiebre. Vomita ms de una vez. Las heces son sanguinolentas, negras o alquitranadas. Resumen La diverticulitis es la infeccin o inflamacin de pequeas bolsas (divertculos) en el colon que se forman por una afeccin llamada diverticulosis. Los divertculos pueden atrapar las heces (materia fecal) y las bacterias, lo cual causa infeccin e inflamacin. Usted tiene un riesgo mayor de tener esta afeccin si sufre de diverticulosis y sigue una dieta que no incluye la suficiente cantidad de [place]. La mayora de los casos de esta afeccin son leves y pueden tratarse Financial planner. Puede que los casos ms graves deban tratarse en el hospital. Cuando la afeccin est controlada, el mdico puede recomendarle que se haga un examen llamado colonoscopa. Este examen puede mostrar la gravedad de los divertculos y si hay algo ms que pueda estar causando los sntomas. Concurra a todas las visitas de seguimiento como se lo haya indicado el mdico. Esto  es importante. Esta informacin no tiene Marine scientist el consejo del mdico. Asegrese de hacerle al mdico cualquier pregunta que tenga. Document Revised: 02/12/2019 Document Reviewed: 02/12/2019 Elsevier Patient Education  [place].  ------------------------------------------------  Pielonefritis en los adultos Pyelonephritis, Adult La pielonefritis es una infeccin que se produce en el rin. Los riones son los rganos que filtran la sangre y [employer] residuos del torrente sanguneo a travs de la orina. La orina pasa desde los riones, a travs de tubos llamados urteres, hacia la vejiga. Hay dos tipos principales de pielonefritis: Infecciones que se inician rpidamente sin sntomas previos (pielonefritis aguda). Infecciones que persisten durante un perodo prolongado (pielonefritis crnica). En la [employer], la infeccin desaparece con el tratamiento y no causa otros problemas. Las infecciones ms graves o crnicas a veces pueden propagarse al torrente sanguneo u ocasionar otros problemas en los riones. Cules son las causas? Por lo general, entre las causas de esta afeccin, se incluyen las siguientes: Bacterias que viajan desde la vejiga al rin. Esto puede ocurrir despus de haber tenido una infeccin en la vejiga (cistitis) o una infeccin urinaria (IU). Infecciones de la vejiga causadas por bacterias que viajan desde el torrente sanguneo hasta el rin. Qu incrementa el riesgo? Es ms probable que esta afeccin se manifieste en: Mujeres embarazadas. Personas de edad avanzada. Personas que tienen alguna de estas afecciones: Diabetes. Inflamacin de la prstata (prostatitis) en los hombres. Clculos renales o en la vejiga. Otras anormalidades del rin o de Geologist, engineering. Cncer. Las personas que tienen una sonda vesical. Las personas que son sexualmente [place]. Las mujeres que usan espermicidas.  Las personas que tuvieron una IU  previa. Cules son los signos o los sntomas? Los sntomas de esta afeccin incluyen: Ganas frecuentes de Garment/textile technologist. Necesidad intensa o persistente de Garment/textile technologist. Sensacin de ardor o escozor al [employer]. Dolor abdominal. Dolor de espalda. Dolor al costado del cuerpo o en la fosa lumbar. Fiebre o escalofros. Sangre en la orina u orina de color oscuro. Nuseas o vmitos. Cmo se diagnostica? Esta afeccin se puede diagnosticar en funcin de lo siguiente: Los antecedentes mdicos y un examen fsico. Anlisis de [place]. Anlisis de [place]. Tambin pueden [employer] de diagnstico por imgenes de los riones, por [place], una ecografa o una exploracin por tomografa computarizada (TC). Cmo se trata? El tratamiento de esta afeccin puede depender de la gravedad de la infeccin. Si la infeccin es leve y se detecta rpidamente, pueden administrarle antibiticos por boca (va oral). Deber tomar lquido para permanecer hidratado. Si la infeccin es ms grave, es posible que deban hospitalizarlo para administrarle antibiticos directamente en una vena a travs de una va intravenosa (IV). Quizs tambin deban administrarle lquidos a travs de una va intravenosa si no puede permanecer bien hidratado. Despus de la hospitalizacin, es posible que deba tomar antibiticos durante un [place]. Podrn prescribirle otros tratamientos segn la causa de la infeccin. Siga estas instrucciones en su casa: Medicamentos Tome su antibitico como se lo haya indicado el mdico. No deje de tomar el antibitico aunque comience a sentirse mejor. Use los medicamentos de venta libre y los recetados solamente como se lo haya indicado el mdico. Instrucciones generales  Beba suficiente lquido como para Theatre manager la orina de color amarillo plido. Evite la cafena, el t y las bebidas gaseosas. Estas sustancias irritan la vejiga. Orine con frecuencia. Evite retener la orina durante largos perodos. Orine antes y  despus [place]. Despus de las deposiciones, las mujeres deben higienizarse desde adelante hacia atrs. Use cada papel solamente una vez. Concurra a todas las visitas de seguimiento como se lo haya indicado el mdico. Esto es importante. Comunquese con un mdico si: Los sntomas no mejoran despus de 2 das de tratamiento. Sus sntomas empeoran. Tiene fiebre. Solicite ayuda de inmediato si: No puede tomar los antibiticos ni ingerir lquidos. Tiene escalofros. Vomita. Siente un dolor intenso en la espalda o en la fosa lumbar. Se desmaya o siente una debilidad extrema. Resumen La pielonefritis es una infeccin urinaria (IU) que se produce en el rin. El tratamiento de esta afeccin puede depender de la gravedad de la infeccin. Tome su antibitico como se lo haya indicado el mdico. No deje de tomar el antibitico aunque comience a sentirse mejor. Beba suficiente lquido como para Theatre manager la orina de color amarillo plido. Concurra a todas las visitas de seguimiento como se lo haya indicado el mdico. Esto es importante. Esta informacin no tiene Marine scientist el consejo del mdico. Asegrese de hacerle al mdico cualquier pregunta que tenga. Document Revised: 10/15/2020 Document Reviewed: 10/15/2020 Elsevier Patient Education  [place].

## 2021-11-25 LAB — URINE CULTURE: Culture: <10000 — AB

## 2021-12-01 ENCOUNTER — Encounter: Payer: Self-pay | Admitting: *Deleted

## 2021-12-07 ENCOUNTER — Other Ambulatory Visit: Payer: Self-pay | Admitting: Physician Assistant

## 2021-12-07 DIAGNOSIS — Z1231 Encounter for screening mammogram for malignant neoplasm of breast: Secondary | ICD-10-CM

## 2021-12-09 ENCOUNTER — Other Ambulatory Visit: Payer: Self-pay | Admitting: Physician Assistant

## 2021-12-09 DIAGNOSIS — E785 Hyperlipidemia, unspecified: Secondary | ICD-10-CM

## 2021-12-09 DIAGNOSIS — Z862 Personal history of diseases of the blood and blood-forming organs and certain disorders involving the immune mechanism: Secondary | ICD-10-CM

## 2021-12-09 DIAGNOSIS — E1165 Type 2 diabetes mellitus with hyperglycemia: Secondary | ICD-10-CM

## 2021-12-09 DIAGNOSIS — I10 Essential (primary) hypertension: Secondary | ICD-10-CM

## 2021-12-21 ENCOUNTER — Other Ambulatory Visit: Payer: Self-pay | Admitting: Physician Assistant

## 2021-12-22 ENCOUNTER — Encounter: Payer: Self-pay | Attending: Physician Assistant | Admitting: Nutrition

## 2021-12-22 ENCOUNTER — Telehealth: Payer: Self-pay | Admitting: Nutrition

## 2021-12-22 ENCOUNTER — Encounter: Payer: Self-pay | Admitting: Nutrition

## 2021-12-22 ENCOUNTER — Ambulatory Visit (INDEPENDENT_AMBULATORY_CARE_PROVIDER_SITE_OTHER): Payer: Self-pay | Admitting: Nurse Practitioner

## 2021-12-22 ENCOUNTER — Encounter: Payer: Self-pay | Admitting: Nurse Practitioner

## 2021-12-22 ENCOUNTER — Other Ambulatory Visit (HOSPITAL_COMMUNITY)
Admission: RE | Admit: 2021-12-22 | Discharge: 2021-12-22 | Disposition: A | Discharge status: Home / Self Care | Payer: Self-pay | Source: Ambulatory Visit | Attending: Physician Assistant | Admitting: Physician Assistant

## 2021-12-22 VITALS — BP 106/68 | HR 69 | Ht 60.5 in | Wt 158.0 lb

## 2021-12-22 VITALS — Ht 60.0 in | Wt 158.6 lb

## 2021-12-22 DIAGNOSIS — Z794 Long term (current) use of insulin: Secondary | ICD-10-CM | POA: Insufficient documentation

## 2021-12-22 DIAGNOSIS — I10 Essential (primary) hypertension: Secondary | ICD-10-CM | POA: Insufficient documentation

## 2021-12-22 DIAGNOSIS — E785 Hyperlipidemia, unspecified: Secondary | ICD-10-CM | POA: Insufficient documentation

## 2021-12-22 DIAGNOSIS — Z862 Personal history of diseases of the blood and blood-forming organs and certain disorders involving the immune mechanism: Secondary | ICD-10-CM | POA: Insufficient documentation

## 2021-12-22 DIAGNOSIS — E1140 Type 2 diabetes mellitus with diabetic neuropathy, unspecified: Secondary | ICD-10-CM | POA: Insufficient documentation

## 2021-12-22 DIAGNOSIS — E782 Mixed hyperlipidemia: Secondary | ICD-10-CM

## 2021-12-22 DIAGNOSIS — E1165 Type 2 diabetes mellitus with hyperglycemia: Secondary | ICD-10-CM | POA: Insufficient documentation

## 2021-12-22 LAB — COMPREHENSIVE METABOLIC PANEL
ALT: 19 U/L (ref 0–44)
AST: 19 U/L (ref 15–41)
Albumin: 4.1 g/dL (ref 3.5–5.0)
Alkaline Phosphatase: 65 U/L (ref 38–126)
Anion gap: 8 (ref 5–15)
BUN: 18 mg/dL (ref 6–20)
CO2: 26 mmol/L (ref 22–32)
Calcium: 9.1 mg/dL (ref 8.9–10.3)
Chloride: 102 mmol/L (ref 98–111)
Creatinine, Ser: 0.92 mg/dL (ref 0.44–1.00)
GFR, Estimated: >60 mL/min (ref >60)
Glucose, Bld: 132 mg/dL — ABNORMAL HIGH (ref 70–99)
Potassium: 4.1 mmol/L (ref 3.5–5.1)
Sodium: 136 mmol/L (ref 135–145)
Total Bilirubin: 0.8 mg/dL (ref 0.3–1.2)
Total Protein: 8 g/dL (ref 6.5–8.1)

## 2021-12-22 LAB — POCT UA - MICROALBUMIN
Albumin/Creatinine Ratio, Urine, POC: <30
Creatinine, POC: 100 mg/dL
Microalbumin Ur, POC: 10 mg/L

## 2021-12-22 LAB — POCT GLYCOSYLATED HEMOGLOBIN (HGB A1C): Hemoglobin A1C: 7 % — AB (ref 4.0–5.6)

## 2021-12-22 LAB — CBC
HCT: 35.4 % — ABNORMAL LOW (ref 36.0–46.0)
Hemoglobin: 11.6 g/dL — ABNORMAL LOW (ref 12.0–15.0)
MCH: 29.1 pg (ref 26.0–34.0)
MCHC: 32.8 g/dL (ref 30.0–36.0)
MCV: 88.7 fL (ref 80.0–100.0)
Platelets: 262 10*3/uL (ref 150–400)
RBC: 3.99 MIL/uL (ref 3.87–5.11)
RDW: 14.7 % (ref 11.5–15.5)
WBC: 6.6 10*3/uL (ref 4.0–10.5)
nRBC: 0 % (ref 0.0–0.2)

## 2021-12-22 LAB — LIPID PANEL
Cholesterol: 118 mg/dL (ref 0–200)
HDL: 42 mg/dL (ref >40)
LDL Cholesterol: 57 mg/dL (ref 0–99)
Total CHOL/HDL Ratio: 2.8 RATIO
Triglycerides: 97 mg/dL (ref <150)
VLDL: 19 mg/dL (ref 0–40)

## 2021-12-22 MED ORDER — METFORMIN HCL 1000 MG PO TABS: ORAL_TABLET | ORAL | 3 refills | Status: DC

## 2021-12-22 MED ORDER — NOVOLIN 70/30 RELION (70-30) 100 UNIT/ML ~~LOC~~ SUSP: SUBCUTANEOUS | 3 refills | Status: DC

## 2021-12-22 NOTE — Progress Notes (Signed)
Endocrinology Consult Note       12/22/2021, 9:54 AM   Subjective:    Patient ID: Amanda Zamora, female    DOB: Jul 19, 1960.  Amanda Zamora is being seen in consultation for management of currently uncontrolled symptomatic diabetes requested by  Soyla Dryer, PA-C.   Past Medical History:  Diagnosis Date   Detached retina    GERD (gastroesophageal reflux disease)    History of pyelonephritis    PONV (postoperative nausea and vomiting)    Type 2 diabetes mellitus ([place])     Past Surgical History:  Procedure Laterality Date   CHOLECYSTECTOMY N/A 12/23/2019   Procedure: LAPAROSCOPIC CHOLECYSTECTOMY;  Surgeon: Virl Cagey, MD;  Location: [facility];  Service: General;  Laterality: N/A;   DENTAL SURGERY  06/01/2021   Removed 5 teeth   EYE SURGERY Right    retina   LAPAROSCOPIC APPENDECTOMY N/A 07/05/2020   Procedure: APPENDECTOMY LAPAROSCOPIC;  Surgeon: Aviva Signs, MD;  Location: [facility];  Service: General;  Laterality: N/A;   TUBAL LIGATION      Social History   Socioeconomic History   Marital status: Single    Spouse name: Not on file   Number of children: Not on file   Years of education: Not on file   Highest education level: Not on file  Occupational History   Not on file  Tobacco Use   Smoking status: Never   Smokeless tobacco: Never  Vaping Use   Vaping Use: Never used  Substance and Sexual Activity   Alcohol use: Not Currently    Comment: previously would drink occ. beer last drink 06/2017   Drug use: No   Sexual activity: Yes    Birth control/protection: None  Other Topics Concern   Not on file  Social History Narrative   Not on file   Social Determinants of Health   Financial Resource Strain: Not on file  Food Insecurity: Not on file  Transportation Needs: Not on file  Physical Activity: Not on file  Stress: Not on file  Social Connections: Not on file     Family History  Problem Relation Age of Onset   Breast cancer Sister    Diabetes Sister     Outpatient Encounter Medications as of 12/22/2021  Medication Sig   amLODipine (NORVASC) 10 MG tablet Take 1 tablet (10 mg total) by mouth daily.   atorvastatin (LIPITOR) 80 MG tablet Tome una tableta por boca al dormir   cyclobenzaprine (FLEXERIL) 10 MG tablet TAKE 1 TABLET BY MOUTH TWICE DAILY AS NEEDED FOR MUSCLE SPASM   gabapentin (NEURONTIN) 300 MG capsule Take 1 capsule by mouth at bedtime   losartan (COZAAR) 100 MG tablet Take 1 tablet (100 mg total) by mouth daily.   multivitamin-iron-minerals-folic acid (CENTRUM) chewable tablet Chew 1 tablet by mouth daily.   Omega-3 Fatty Acids (FISH OIL) 1000 MG CAPS Take 1 capsule (1,000 mg total) by mouth in the morning and at bedtime.   omeprazole (PRILOSEC) 20 MG capsule Take 1 capsule (20 mg total) by mouth daily. Tome una tableta por boca diaria (Patient taking differently: Take 20 mg by mouth daily as needed. Tome una tableta por boca diaria)   [  DISCONTINUED] insulin NPH-regular Human (NOVOLIN 70/30 RELION) (70-30) 100 UNIT/ML injection 20 units with breakfast and 20 units with supper if glucose is above 90 and she is eating.   [DISCONTINUED] metFORMIN (GLUCOPHAGE) 1000 MG tablet TAKE 1 TABLET BY MOUTH TWICE DAILY WITH A MEAL.   Tome una tableta por boca dos veces diarias con comida   diclofenac Sodium (VOLTAREN) 1 % GEL Apply 2 g topically 4 (four) times daily. (Patient not taking: Reported on 11/24/2021)   insulin NPH-regular Human (NOVOLIN 70/30 RELION) (70-30) 100 UNIT/ML injection 20 units with breakfast and 20 units with supper if glucose is above 90 and she is eating.   metFORMIN (GLUCOPHAGE) 1000 MG tablet TAKE 1 TABLET BY MOUTH TWICE DAILY WITH A MEAL.   Tome una tableta por boca dos veces diarias con comida   [DISCONTINUED] cyclobenzaprine (FLEXERIL) 10 MG tablet TAKE 1 TABLET BY MOUTH TWICE DAILY AS NEEDED FOR MUSCLE SPASM    [DISCONTINUED] gabapentin (NEURONTIN) 300 MG capsule Take 1 capsule by mouth at bedtime   No facility-administered encounter medications on file as of 12/22/2021.    ALLERGIES: No Known Allergies  VACCINATION STATUS: Immunization History  Administered Date(s) Administered   Influenza Split 11/05/2014   Influenza-Unspecified 12/02/2018   Moderna Sars-Covid-2 Vaccination 05/28/2019, 06/27/2019   Tdap 09/30/2019    Diabetes She presents for her follow-up diabetic visit. She has type 2 diabetes mellitus. Onset time: diagnosed at approx age of 61. Her disease course has been improving. There are no hypoglycemic associated symptoms. Associated symptoms include fatigue. Pertinent negatives for diabetes include no polydipsia, no polyuria and no weight loss. There are no hypoglycemic complications. Symptoms are improving. Diabetic complications include peripheral neuropathy and retinopathy. Risk factors for coronary artery disease include diabetes mellitus, dyslipidemia, family history, obesity, hypertension and sedentary lifestyle. Current diabetic treatment includes insulin injections and oral agent (monotherapy). She is compliant with treatment most of the time. Her weight is decreasing steadily. She is following a generally healthy diet. When asked about meal planning, she reported none. She has not had a previous visit with a dietitian. She participates in exercise intermittently. Her home blood glucose trend is fluctuating minimally. Her breakfast blood glucose range is generally 130-140 mg/dl. Her overall blood glucose range is 110-130 mg/dl. (She presents today (accompanied by spanish interpreter) with her logs, no meter, showing readings suspicious for authenticity due to frequent repeating even numbers.  Her POCT A1c today is 7%, improving from last visit of 7.7%.  She denies any hypoglycemia.) An ACE inhibitor/angiotensin II receptor blocker is being taken. She does not see a podiatrist.Eye exam  is current.  Hypertension This is a chronic problem. The current episode started more than 1 year ago. The problem has been resolved since onset. The problem is controlled. There are no associated agents to hypertension. Risk factors for coronary artery disease include diabetes mellitus, dyslipidemia, family history, obesity and sedentary lifestyle. Past treatments include calcium channel blockers and angiotensin blockers. The current treatment provides moderate improvement. Compliance problems include diet and exercise.  Hypertensive end-organ damage includes retinopathy.  Hyperlipidemia This is a chronic problem. The current episode started more than 1 year ago. The problem is controlled. Recent lipid tests were reviewed and are normal. Exacerbating diseases include diabetes and obesity. Factors aggravating her hyperlipidemia include fatty foods. Current antihyperlipidemic treatment includes statins. The current treatment provides moderate improvement of lipids. Compliance problems include adherence to diet and adherence to exercise.  Risk factors for coronary artery disease include diabetes mellitus, dyslipidemia,  family history, obesity, hypertension and a sedentary lifestyle.     Review of systems  Constitutional: + steadily decreasing body weight, current Body mass index is 30.35 kg/m., no fatigue, no subjective hyperthermia, no subjective hypothermia Eyes: + blurry vision- hx retinopathy, no xerophthalmia ENT: no sore throat, no nodules palpated in throat, no dysphagia/odynophagia, no hoarseness Cardiovascular: no chest pain, no shortness of breath, no palpitations, no leg swelling Respiratory: no cough, no shortness of breath Gastrointestinal: no nausea/vomiting/diarrhea, + constipation Musculoskeletal: + muscle/joint aches- mostly left side and back- sees PCP for this- taking Flexiril with no relief Skin: no rashes, no hyperemia Neurological: no tremors, no numbness, no tingling, no  dizziness Psychiatric: no depression, no anxiety  Objective:     BP 106/68 (BP Location: Left Arm, Patient Position: Sitting, Cuff Size: Normal)   Pulse 69   Ht 5' 0.5" (1.537 m)   Wt 158 lb (71.7 kg)   LMP 02/15/2011 (Approximate)   BMI 30.35 kg/m   Wt Readings from Last 3 Encounters:  12/22/21 158 lb (71.7 kg)  12/22/21 158 lb 9.6 oz (71.9 kg)  11/24/21 159 lb (72.1 kg)     BP Readings from Last 3 Encounters:  12/22/21 106/68  11/24/21 110/70  09/28/21 118/76      Physical Exam- Limited  Constitutional:  Body mass index is 30.35 kg/m. , not in acute distress, normal state of mind Eyes:  EOMI, no exophthalmos Neck: Supple Cardiovascular: RRR, no murmurs, rubs, or gallops, no edema Respiratory: Adequate breathing efforts, no crackles, rales, rhonchi, or wheezing Musculoskeletal: no gross deformities, strength intact in all four extremities, no gross restriction of joint movements Skin:  no rashes, no hyperemia Neurological: no tremor with outstretched hands    CMP ( most recent) CMP     Component Value Date/Time   NA 136 12/22/2021 0811   K 4.1 12/22/2021 0811   CL 102 12/22/2021 0811   CO2 26 12/22/2021 0811   GLUCOSE 132 (H) 12/22/2021 0811   BUN 18 12/22/2021 0811   CREATININE 0.92 12/22/2021 0811   CREATININE 0.94 11/22/2014 1123   CALCIUM 9.1 12/22/2021 0811   PROT 8.0 12/22/2021 0811   ALBUMIN 4.1 12/22/2021 0811   AST 19 12/22/2021 0811   ALT 19 12/22/2021 0811   ALKPHOS 65 12/22/2021 0811   BILITOT 0.8 12/22/2021 0811   GFRNONAA >60 12/22/2021 0811   GFRAA >60 09/26/2019 1112     Diabetic Labs (most recent): Lab Results  Component Value Date   HGBA1C 7.0 (A) 12/22/2021   HGBA1C 7.7 09/21/2021   HGBA1C 9.5 (H) 04/02/2021   MICROALBUR 10 mg/L 12/22/2021   MICROALBUR 3.3 (H) 10/16/2020   MICROALBUR 12.9 (H) 09/26/2019     Lipid Panel ( most recent) Lipid Panel     Component Value Date/Time   CHOL 118 12/22/2021 0811   TRIG 97  12/22/2021 0811   HDL 42 12/22/2021 0811   CHOLHDL 2.8 12/22/2021 0811   VLDL 19 12/22/2021 0811   LDLCALC 57 12/22/2021 0811               Assessment & Plan:   1) Type 2 diabetes mellitus with hyperglycemia, with long-term current use of insulin ([place])  She presents today (accompanied by spanish interpreter) with her logs, no meter, showing readings suspicious for authenticity due to frequent repeating even numbers.  Her POCT A1c today is 7%, improving from last visit of 7.7%.  She denies any hypoglycemia.  - Amanda Zamora has currently uncontrolled symptomatic type  2 DM since 61 years of age.   -Recent labs reviewed.  - I had a long discussion with her about the progressive nature of diabetes and the pathology behind its complications. -her diabetes is complicated by retinopathy, neuropathy and she remains at a high risk for more acute and chronic complications which include CAD, CVA, CKD, retinopathy, and neuropathy. These are all discussed in detail with her.  - Nutritional counseling repeated at each appointment due to patients tendency to fall back in to old habits.  - The patient admits there is a room for improvement in their diet and drink choices. -  Suggestion is made for the patient to avoid simple carbohydrates from their diet including Cakes, Sweet Desserts / Pastries, Ice Cream, Soda (diet and regular), Sweet Tea, Candies, Chips, Cookies, Sweet Pastries, Store Bought Juices, Alcohol in Excess of 1-2 drinks a day, Artificial Sweeteners, Coffee Creamer, and "Sugar-free" Products. This will help patient to have stable blood glucose profile and potentially avoid unintended weight gain.   - I encouraged the patient to switch to unprocessed or minimally processed complex starch and increased protein intake (animal or plant source), fruits, and vegetables.   - Patient is advised to stick to a routine mealtimes to eat 3 meals a day and avoid unnecessary snacks (to snack  only to correct hypoglycemia).  -She is seeing Jearld Fenton, RDE after my visit today for diabetes education.  - I have approached her with the following individualized plan to manage her diabetes and patient agrees:    -She is advised to continue her Metformin 1000 mg po twice daily with meals and adjust her dose of NPH 70/30 to 20 units twice daily with meals if glucose is above 90 and she is eating.    -she is encouraged to continue monitoring blood glucose 3 times daily, before injecting insulin at breakfast and supper, and before bed, and to call the clinic if her readings are less than 70 or above 300 for 3 tests in a row.  - she is warned not to take insulin without proper monitoring per orders. - Adjustment parameters are given to her for hypo and hyperglycemia in writing.  - she will be considered for incretin therapy as appropriate next visit.  - Specific targets for  A1c; LDL, HDL, and Triglycerides were discussed with the patient.  2) Blood Pressure /Hypertension:  her blood pressure is controlled to target.   she is advised to continue her current medications including Norvasc 10 mg p.o. daily with breakfast and Losartan 100 mg po daily.  3) Lipids/Hyperlipidemia:    Review of her recent lipid panel from 09/21/21 showed controlled LDL at 45.  she is advised to continue Lipitor 80 mg daily at bedtime.  Side effects and precautions discussed with her.  4)  Weight/Diet:  her Body mass index is 30.35 kg/m.  -  clearly complicating her diabetes care.   she is a candidate for weight loss. I discussed with her the fact that loss of 5 - 10% of her  current body weight will have the most impact on her diabetes management.  Exercise, and detailed carbohydrates information provided  -  detailed on discharge instructions.  5) Chronic Care/Health Maintenance: -she is on ACEI/ARB and Statin medications and is encouraged to initiate and continue to follow up with Ophthalmology, Dentist,  Podiatrist at least yearly or according to recommendations, and advised to stay away from smoking. I have recommended yearly flu vaccine and pneumonia vaccine at least  every 5 years; moderate intensity exercise for up to 150 minutes weekly; and sleep for at least 7 hours a day.  - she is advised to maintain close follow up with Soyla Dryer, PA-C for primary care needs, as well as her other providers for optimal and coordinated care.     I spent 30 minutes in the care of the patient today including review of labs from [place], Lipids, Thyroid Function, Hematology (current and previous including abstractions from other facilities); face-to-face time discussing  her blood glucose readings/logs, discussing hypoglycemia and hyperglycemia episodes and symptoms, medications doses, her options of short and long term treatment based on the latest standards of care / guidelines;  discussion about incorporating lifestyle medicine;  and documenting the encounter. Risk reduction counseling performed per USPSTF guidelines to reduce obesity and cardiovascular risk factors.     Please refer to Patient Instructions for Blood Glucose Monitoring and Insulin/Medications Dosing Guide"  in media tab for additional information. Please  also refer to " Patient Self Inventory" in the Media  tab for reviewed elements of pertinent patient history.  Amanda Zamora participated in the discussions, expressed understanding, and voiced agreement with the above plans.  All questions were answered to her satisfaction. she is encouraged to contact clinic should she have any questions or concerns prior to her return visit.     Follow up plan: - Return in about 4 months (around 04/22/2022) for Diabetes F/U with A1c in office, No previsit labs, Bring meter and logs.   Rayetta Pigg, [facility] [address] [place], [place] [address] Phone: [phone] Fax: [phone]  12/22/2021, 9:54  AM

## 2021-12-22 NOTE — Progress Notes (Addendum)
Medical Nutrition Therapy  DM Follow up Appointment Start time: 0900 Appointment End time:  0930  Primary concerns today: Diabetes Type 2  Referral diagnosis: E11.8 Preferred learning style: Hear  Learning readiness: Readu   NUTRITION ASSESSMENT DM Follow up Se notes she is constipated. Going only 2 times per week. Drinking more water.  Eating 3 meals per day. Eating more fruits and vegetables.. Making excellent progress. FBS are 130-140's. Night BS are 100-130's. No low blood sugars.   To see Whitney today. Will get A1C. Labs done for the free clinic. A1C 7%. Down from 7.7%. Her TG are much improved. Still taking 20 units of 70/30 insulin BID. Complains of constipation and nausea. Reviewed foods to help with constipation of high fiber foods, exercise and water intake. Encouraged to cut out bananas and applesauce. Eating breakfast daily now and eating three meals per day and better food choices. BS much better. She feels much better. More active. Lost 3 lbs since last visit.  Can't see well. Has had 2 surgeries on right her eyes. 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 Lab Results  Component Value Date   HGBA1C 7.0 (A) 12/22/2021     Anthropometrics  Wt Readings from Last 3 Encounters:  12/22/21 158 lb 9.6 oz (71.9 kg)  11/24/21 159 lb (72.1 kg)  09/28/21 162 lb (73.5 kg)   Ht Readings from Last 3 Encounters:  11/24/21 5' 0.5" (1.537 m)  09/28/21 5' 0.5" (1.537 m)  09/21/21 5' 0.05" (1.525 m)   Body mass index is 30.46 kg/m. '@BMIFA'$ @ Facility age limit for growth %iles is 20 years. Facility age limit for growth %iles is 20 years.   Clinical Medical Hx: see chart Medications: 70/30 insulin  20 units BID, Metformin 1000 mg BID, Labs:      Latest Ref Rng & Units 12/22/2021    8:11 AM 11/24/2021    1:24 PM 09/21/2021    9:30 AM  CMP  Glucose 70 - 99 mg/dL 132   164   BUN 6 - 20 mg/dL 18   18   Creatinine 0.44 - 1.00 mg/dL 0.92   0.90  0.97   Sodium 135 - 145 mmol/L 136   138   Potassium 3.5 - 5.1 mmol/L 4.1   5.0   Chloride 98 - 111 mmol/L 102   103   CO2 22 - 32 mmol/L 26   27   Calcium 8.9 - 10.3 mg/dL 9.1   9.1   Total Protein 6.5 - 8.1 g/dL 8.0   7.8   Total Bilirubin 0.3 - 1.2 mg/dL 0.8   0.9   Alkaline Phos 38 - 126 U/L 65   77   AST 15 - 41 U/L 19   20   ALT 0 - 44 U/L 19   14    Lipid Panel     Component Value Date/Time   CHOL 118 12/22/2021 0811   TRIG 97 12/22/2021 0811   HDL 42 12/22/2021 0811   CHOLHDL 2.8 12/22/2021 0811   VLDL 19 12/22/2021 0811   LDLCALC 57 12/22/2021 0811    Notable Signs/Symptoms: Increased thirsty, fatigue  Lifestyle & Dietary Hx Lives alone.  Has been eating 1-2 meals per day.  4+ bottles of water per day . 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  Estimated daily fluid intake: 60 oz Supplements: Omega 3 fish oil, Sleep: 8 Stress / self-care: none Current  average weekly physical activity: ADL  24-Hr Dietary Recall First Meal:1 slice ww bread, 1 egg, apple Snack:  Second Meal:  Ground beef, potatoes, green beans., potato, water. Dinner:  chicken, tomatoes, onion on tostado, with lettuce,cheese and hot sauce.   Snack:  Third Meal:  1/3 c beans, chicken nuggets grilled,water Snack:  Beverages: 4-5 bottles of water  Estimated Energy Needs Calories: 1200 Carbohydrate: 135g Protein: 90g Fat: 33g   NUTRITION DIAGNOSIS  NB-1.1 Food and nutrition-related knowledge deficit As related to DIabetes Type 2.  As evidenced by A1C 9.5.   NUTRITION INTERVENTION  Nutrition education (E-1) on the following topics:  Nutrition and Diabetes education provided on My Plate, CHO counting, meal planning, portion sizes, timing of meals, avoiding snacks between meals unless having a low blood sugar, target ranges for A1C and blood sugars, signs/symptoms and treatment of hyper/hypoglycemia, monitoring blood sugars, taking medications as  prescribed, benefits of exercising 30 minutes per day and prevention of complications of DM.   The following Lifestyle Medicine recommendations according to [place]  [facility]) were discussed and and offered to patient and she  agrees to start the journey:  A. Whole Foods, Plant-Based Nutrition comprising of fruits and vegetables, plant-based proteins, whole-grain carbohydrates was discussed in detail with the patient.   A list for source of those nutrients were also provided to the patient.  Patient will use only water or unsweetened tea for hydration. B.  The need to stay away from risky substances including alcohol, smoking; obtaining 7 to 9 hours of restorative sleep, at least 150 minutes of moderate intensity exercise weekly, the importance of healthy social connections,  and stress management techniques were discussed. C.  A full color page of  Calorie density of various food groups per pound showing examples of each food groups was provided to the patient.  Handouts Provided Include  Lifestyle medicine Plant based meal plan Meal Plan Card-spanish Diabetes and You Spanish S/s of hyper/hypoglycemia-spanish  Learning Style & Readiness for Change Teaching method utilized: Visual & Auditory  Demonstrated degree of understanding via: Teach Back  Barriers to learning/adherence to lifestyle change: Limited vision  Goals Established by Pt .Goals Drink 4 oz of warm prune juice once a day For breakfast do 1/2 c of cooked oatmeal with 1 cup bluberries and water Ask pharmacist for some miralax or non brand mirlax to take to help with constipation if the prune  Cut out bananas and applesauce due to constipation. Keep up the great job!! Swtich to sweet potato instead of white potato Add more dried beans and cut out animal meat Keep drinking water   MONITORING & EVALUATION Dietary intake, weekly physical activity, and blood sugars in 3 month.  Next Steps  Patient  is to work on plant based meals.

## 2021-12-22 NOTE — Patient Instructions (Addendum)
Goals Drink 4 oz of warm prune juice once a day For breakfast do 1/2 c of cooked oatmeal with 1 cup bluberries and water Ask pharmacist for some miralax or non brand mirlax to take to help with constipation if the prune  Cut out bananas and applesauce due to constipation. Keep up the great job!! Swtich to sweet potato instead of white potato Add more dried beans and cut out animal meat Keep drinking water

## 2021-12-22 NOTE — Telephone Encounter (Signed)
Can you make her f/u? Her next appt with Whitney is 04/26/22 9:30   Thank you

## 2021-12-23 LAB — MICROALBUMIN, URINE: Microalb, Ur: <3 ug/mL — ABNORMAL HIGH

## 2021-12-28 ENCOUNTER — Ambulatory Visit: Payer: Self-pay | Admitting: Physician Assistant

## 2021-12-28 ENCOUNTER — Encounter: Payer: Self-pay | Admitting: Physician Assistant

## 2021-12-28 VITALS — BP 114/68 | HR 71 | Temp 97.2°F | Ht 60.5 in | Wt 156.5 lb

## 2021-12-28 DIAGNOSIS — D649 Anemia, unspecified: Secondary | ICD-10-CM

## 2021-12-28 DIAGNOSIS — E11319 Type 2 diabetes mellitus with unspecified diabetic retinopathy without macular edema: Secondary | ICD-10-CM

## 2021-12-28 DIAGNOSIS — E785 Hyperlipidemia, unspecified: Secondary | ICD-10-CM

## 2021-12-28 DIAGNOSIS — Z789 Other specified health status: Secondary | ICD-10-CM

## 2021-12-28 DIAGNOSIS — K5900 Constipation, unspecified: Secondary | ICD-10-CM

## 2021-12-28 DIAGNOSIS — E1165 Type 2 diabetes mellitus with hyperglycemia: Secondary | ICD-10-CM

## 2021-12-28 DIAGNOSIS — R935 Abnormal findings on diagnostic imaging of other abdominal regions, including retroperitoneum: Secondary | ICD-10-CM

## 2021-12-28 DIAGNOSIS — I10 Essential (primary) hypertension: Secondary | ICD-10-CM

## 2021-12-28 NOTE — Patient Instructions (Addendum)
[facility]   Address: [address] [place], [place] [address] Phone: [phone]      --------------------------------------------    [place] alto contenido de hierro Iron-Rich Diet  El hierro es un mineral que ayuda al organismo a producir hemoglobina. La hemoglobina es una protena de los glbulos rojos que transporta el oxgeno a los tejidos del cuerpo. Consumir muy poco hierro [employer] se sienta dbil y [place], y aumentar su riesgo de contraer infecciones. El hierro es un componente natural de muchos alimentos, y muchos otros alimentos tienen hierro agregado (estn fortificados con hierro). Es posible que deba seguir una dieta con alto contenido de hierro si no tiene suficiente hierro en el cuerpo debido a Actuary. La cantidad de potasio que necesita diariamente depende de su edad, su sexo y las afecciones que pueda [address]. Siga las recomendaciones de su mdico o nutricionista sobre la cantidad de hierro que necesita consumir por Training and development officer. Cules son algunos consejos para seguir este plan? Lea las etiquetas de los alimentos Lea las etiquetas de los alimentos para saber la cantidad de miligramos (mg) de hierro que hay en cada porcin. Al cocinar Cocine los alimentos en ollas de hierro. Tome estas medidas para que el cuerpo pueda absorber el hierro de ciertos alimentos con ms facilidad: Antes de cocinarlos, remoje los frijoles durante la noche. Remoje los cereales integrales durante la noche y culelos antes de usarlos para cocinar. Prepare un fermento con las harinas antes del horneado, por ejemplo, usando levadura en la masa del pan. Planificacin de las comidas Cuando coma alimentos que contengan hierro, debe comerlos con alimentos ricos en vitamina C. Entre ellos, se incluyen las [place], los pimientos, los tomates, las papas y Education officer, environmental. La vitamina C ayuda al organismo a Research scientist (physical sciences). Ciertos alimentos y  bebidas impiden que el cuerpo absorba el hierro adecuadamente. No consuma estos alimentos en la misma comida que aquellos con alto contenido de hierro o con suplementos de Sport and exercise psychologist. Estos alimentos incluyen: Caf, t negro y vino tinto. Leche, productos lcteos y alimentos con alto contenido de calcio. Porotos y soja. Cereales integrales. Informacin general Tome los suplementos de hierro solamente como se lo haya indicado el mdico. La sobredosis de hierro puede ser potencialmente mortal. Si le recetan suplementos de hierro, tmelos con jugo de naranja o un suplemento de vitamina C. Cuando coma alimentos fortificados con hierro o tome un suplemento de hierro, tambin debe consumir alimentos que contengan hierro naturalmente, como carne, aves y pescado. Consumir alimentos ricos en hierro naturalmente ayuda al organismo a absorber el hierro que se aade a otros alimentos o que contiene un suplemento. El hierro de origen animal se absorbe mejor que el hierro de origen vegetal. Qu alimentos debo comer? Frutas Ciruelas pasas. Pasas de uva. Consuma frutas con alto contenido de vitamina C, por ejemplo, naranjas, pomelos y fresas, junto con los alimentos ricos en hierro. Verduras [place]spin[place] (cocida). Guisantes. Brcoli. Verduras fermentadas. Consuma verduras ricas en vitamina C, como las verduras de [place], las papas, los morrones y los tomates, junto con los alimentos ricos en hierro. Granos Cereales para el desayuno fortificados con hierro. Pan de trigo integral fortificado con hierro. Arroz enriquecido. Granos germinados. Carnes y otras protenas Hgado de res. Carne de vaca. Pavo. Pollo. Ostras. Camarones. Atn. Sardinas. Garbanzos. Frutos secos. Tofu. Semillas de calabaza. Bebidas Jugo de tomate. Jugo de naranja recin exprimido. Jugo de ciruelas. T de hibisco. Batidos de desayuno instantneos fortificados con hierro. Dulces y  postres Melaza negra. Alios y condimentos Tahini. Salsa de soja  fermentada. Otros alimentos Germen de trigo. Es posible que los productos mencionados arriba no formen una lista completa de las bebidas o los alimentos recomendados. Consulte a un nutricionista para obtener ms informacin. Qu alimentos debo limitar? Estos son los alimentos que deben limitarse al comer alimentos ricos en hierro, ya que pueden reducir la absorcin de hierro por parte del cuerpo. Granos Cereales integrales. Cereal de salvado. Harina de salvado. Carnes y otras protenas Soja. Productos elaborados a base de protena de la soja. Frijoles negros. Lentejas. Frijoles mungo. Guisantes secos. Lcteos Leche. Crema. Queso. Yogur. Requesn. Bebidas Caf. T negro. Vino tinto. Dulces y [employer]. Chocolate. Helados. Alios y condimentos [place]. Organo. Grandes cantidades de perejil. Es posible que los productos que se enumeran ms [place] no constituyan una lista completa de los alimentos y las bebidas que debe limitar. Consulte a un nutricionista para obtener ms informacin. Resumen El hierro es un mineral que ayuda al organismo a producir hemoglobina. La hemoglobina es una protena de los glbulos rojos que transporta el oxgeno a los tejidos del cuerpo. El hierro es un componente natural de muchos alimentos, y muchos otros alimentos tienen hierro agregado (estn fortificados con hierro). Cuando coma alimentos que contengan hierro, debe tomarlos con alimentos ricos en vitamina C. La vitamina C ayuda al organismo a Research scientist (physical sciences). Ciertos alimentos y bebidas impiden que el cuerpo absorba el hierro adecuadamente, como los cereales integrales y los productos lcteos. Debe evitar consumir estos alimentos en la misma comida que aquellos con alto contenido de hierro o con suplementos de hierro. Esta informacin no tiene Marine scientist el consejo del mdico. Asegrese de hacerle al mdico cualquier pregunta que tenga. Document Revised: 02/08/2020 Document Reviewed:  01/27/2020 Elsevier Patient Education  [place].

## 2021-12-28 NOTE — Progress Notes (Signed)
BP 114/68   Pulse 71   Temp (!) 97.2 F (36.2 C)   Ht 5' 0.5" (1.537 m)   Wt 156 lb 8 oz (71 kg)   LMP 02/15/2011 (Approximate)   SpO2 96%   BMI 30.06 kg/m    Subjective:    Patient ID: Amanda Zamora, female    DOB: Oct 29, 1960, 61 y.o.   MRN: [id number]  HPI: Amanda Zamora is a 61 y.o. female presenting on 12/28/2021 for Hypertension and Hyperlipidemia   HPI  Chief Complaint  Patient presents with   Hypertension   Hyperlipidemia      She follows with endocrinology for her DM.   She sees eye doctor at [facility]  She wonders about stomach doctor.     She has BMs only once every 2-3 days.   She says RD recommended prune juice and oatmeal but she says that didn't help.   She has GI appt next week.  She also had the abnormality on CT scan that needs to r/o neoplasm.      Relevant past medical, surgical, family and social history reviewed and updated as indicated. Interim medical history since our last visit reviewed. Allergies and medications reviewed and updated.    Current Outpatient Medications:    amLODipine (NORVASC) 10 MG tablet, Take 1 tablet (10 mg total) by mouth daily., Disp: 90 tablet, Rfl: 0   atorvastatin (LIPITOR) 80 MG tablet, Tome una tableta por boca al dormir, Disp: 90 tablet, Rfl: 1   cyclobenzaprine (FLEXERIL) 10 MG tablet, TAKE 1 TABLET BY MOUTH TWICE DAILY AS NEEDED FOR MUSCLE SPASM, Disp: 20 tablet, Rfl: 0   gabapentin (NEURONTIN) 300 MG capsule, Take 1 capsule by mouth at bedtime, Disp: 30 capsule, Rfl: 0   insulin NPH-regular Human (NOVOLIN 70/30 RELION) (70-30) 100 UNIT/ML injection, 20 units with breakfast and 20 units with supper if glucose is above 90 and she is eating., Disp: 30 mL, Rfl: 3   losartan (COZAAR) 100 MG tablet, Take 1 tablet (100 mg total) by mouth daily., Disp: 90 tablet, Rfl: 1   metFORMIN (GLUCOPHAGE) 1000 MG tablet, TAKE 1 TABLET BY MOUTH TWICE DAILY WITH A MEAL.   Tome una tableta por boca dos veces diarias con comida,  Disp: 180 tablet, Rfl: 3   multivitamin-iron-minerals-folic acid (CENTRUM) chewable tablet, Chew 1 tablet by mouth daily., Disp: , Rfl:    Omega-3 Fatty Acids (FISH OIL) 1000 MG CAPS, Take 1 capsule (1,000 mg total) by mouth in the morning and at bedtime. (Patient taking differently: Take 1 capsule by mouth daily.), Disp: 60 capsule, Rfl: 2   omeprazole (PRILOSEC) 20 MG capsule, Take 1 capsule (20 mg total) by mouth daily. Tome una tableta por boca diaria (Patient taking differently: Take 20 mg by mouth daily as needed. Tome una tableta por boca diaria), Disp: 90 capsule, Rfl: 1   diclofenac Sodium (VOLTAREN) 1 % GEL, Apply 2 g topically 4 (four) times daily. (Patient not taking: Reported on 11/24/2021), Disp: 20 g, Rfl: 0    Review of Systems  Per HPI unless specifically indicated above     Objective:    BP 114/68   Pulse 71   Temp (!) 97.2 F (36.2 C)   Ht 5' 0.5" (1.537 m)   Wt 156 lb 8 oz (71 kg)   LMP 02/15/2011 (Approximate)   SpO2 96%   BMI 30.06 kg/m   Wt Readings from Last 3 Encounters:  12/28/21 156 lb 8 oz (71 kg)  12/22/21 158 lb (  71.7 kg)  12/22/21 158 lb 9.6 oz (71.9 kg)    Physical Exam Vitals reviewed.  Constitutional:      General: She is not in acute distress.    Appearance: She is well-developed. She is not toxic-appearing.  HENT:     Head: Normocephalic and atraumatic.  Cardiovascular:     Rate and Rhythm: Normal rate and regular rhythm.  Pulmonary:     Effort: Pulmonary effort is normal.     Breath sounds: Normal breath sounds.  Abdominal:     General: Bowel sounds are normal.     Palpations: Abdomen is soft. There is no mass.     Tenderness: There is no abdominal tenderness. There is no guarding or rebound.  Musculoskeletal:     Cervical back: Neck supple.     Right lower leg: No edema.     Left lower leg: No edema.  Lymphadenopathy:     Cervical: No cervical adenopathy.  Skin:    General: Skin is warm and dry.  Neurological:     Mental  Status: She is alert and oriented to person, place, and time.  Psychiatric:        Behavior: Behavior normal.     Results for orders placed or performed during the hospital encounter of 12/22/21  Comprehensive metabolic panel  Result Value Ref Range   Sodium 136 135 - 145 mmol/L   Potassium 4.1 3.5 - 5.1 mmol/L   Chloride 102 98 - 111 mmol/L   CO2 26 22 - 32 mmol/L   Glucose, Bld 132 (H) 70 - 99 mg/dL   BUN 18 6 - 20 mg/dL   Creatinine, Ser 0.92 0.44 - 1.00 mg/dL   Calcium 9.1 8.9 - 10.3 mg/dL   Total Protein 8.0 6.5 - 8.1 g/dL   Albumin 4.1 3.5 - 5.0 g/dL   AST 19 15 - 41 U/L   ALT 19 0 - 44 U/L   Alkaline Phosphatase 65 38 - 126 U/L   Total Bilirubin 0.8 0.3 - 1.2 mg/dL   GFR, Estimated >60 >60 mL/min   Anion gap 8 5 - 15  Lipid panel  Result Value Ref Range   Cholesterol 118 0 - 200 mg/dL   Triglycerides 97 <150 mg/dL   HDL 42 >40 mg/dL   Total CHOL/HDL Ratio 2.8 RATIO   VLDL 19 0 - 40 mg/dL   LDL Cholesterol 57 0 - 99 mg/dL  Microalbumin, urine  Result Value Ref Range   Microalb, Ur <3.0 (H) Not Estab. ug/mL  CBC  Result Value Ref Range   WBC 6.6 4.0 - 10.5 K/uL   RBC 3.99 3.87 - 5.11 MIL/uL   Hemoglobin 11.6 (L) 12.0 - 15.0 g/dL   HCT 35.4 (L) 36.0 - 46.0 %   MCV 88.7 80.0 - 100.0 fL   MCH 29.1 26.0 - 34.0 pg   MCHC 32.8 30.0 - 36.0 g/dL   RDW 14.7 11.5 - 15.5 %   Platelets 262 150 - 400 K/uL   nRBC 0.0 0.0 - 0.2 %      Assessment & Plan:    Encounter Diagnoses  Name Primary?   Essential hypertension Yes   Hyperlipidemia, unspecified hyperlipidemia type    Type 2 diabetes mellitus with hyperglycemia, unspecified whether long term insulin use (HCC)    Constipation, unspecified constipation type    Abnormal CT of the abdomen    Anemia, unspecified type    Diabetic retinopathy associated with type 2 diabetes mellitus, macular edema presence unspecified,  unspecified laterality, unspecified retinopathy severity ([place])    Not proficient in [place] language       -reviewed labs with pt  -encouraged pt to follow iron rich diet.   Recommended she NOT take iron supplement at this time due to her constipation and the anemia is mild -pt says her cafa/[employer] financial assistance is current -pt to continue current meds -pt to see specialists as scheduled -screening mammogram next week as scheduled -pt to follow up here 3 months.  She is to contact office sooner prn

## 2022-01-02 ENCOUNTER — Other Ambulatory Visit: Payer: Self-pay

## 2022-01-02 ENCOUNTER — Encounter (HOSPITAL_COMMUNITY): Payer: Self-pay

## 2022-01-02 ENCOUNTER — Emergency Department (HOSPITAL_COMMUNITY)
Admission: EM | Admit: 2022-01-02 | Discharge: 2022-01-02 | Disposition: A | Discharge status: Home / Self Care | Payer: Self-pay | Attending: Emergency Medicine | Admitting: Emergency Medicine

## 2022-01-02 ENCOUNTER — Emergency Department (HOSPITAL_COMMUNITY): Payer: Self-pay

## 2022-01-02 DIAGNOSIS — Z79899 Other long term (current) drug therapy: Secondary | ICD-10-CM | POA: Insufficient documentation

## 2022-01-02 DIAGNOSIS — K5909 Other constipation: Secondary | ICD-10-CM | POA: Insufficient documentation

## 2022-01-02 DIAGNOSIS — R42 Dizziness and giddiness: Secondary | ICD-10-CM

## 2022-01-02 DIAGNOSIS — I10 Essential (primary) hypertension: Secondary | ICD-10-CM | POA: Insufficient documentation

## 2022-01-02 DIAGNOSIS — E162 Hypoglycemia, unspecified: Secondary | ICD-10-CM

## 2022-01-02 DIAGNOSIS — E11649 Type 2 diabetes mellitus with hypoglycemia without coma: Secondary | ICD-10-CM | POA: Insufficient documentation

## 2022-01-02 DIAGNOSIS — Z794 Long term (current) use of insulin: Secondary | ICD-10-CM | POA: Insufficient documentation

## 2022-01-02 DIAGNOSIS — Z7984 Long term (current) use of oral hypoglycemic drugs: Secondary | ICD-10-CM | POA: Insufficient documentation

## 2022-01-02 DIAGNOSIS — Z20822 Contact with and (suspected) exposure to covid-19: Secondary | ICD-10-CM | POA: Insufficient documentation

## 2022-01-02 LAB — CBC
HCT: 36.1 % (ref 36.0–46.0)
Hemoglobin: 11.7 g/dL — ABNORMAL LOW (ref 12.0–15.0)
MCH: 28.7 pg (ref 26.0–34.0)
MCHC: 32.4 g/dL (ref 30.0–36.0)
MCV: 88.7 fL (ref 80.0–100.0)
Platelets: 293 10*3/uL (ref 150–400)
RBC: 4.07 MIL/uL (ref 3.87–5.11)
RDW: 14.6 % (ref 11.5–15.5)
WBC: 7.5 10*3/uL (ref 4.0–10.5)
nRBC: 0 % (ref 0.0–0.2)

## 2022-01-02 LAB — COMPREHENSIVE METABOLIC PANEL
ALT: 18 U/L (ref 0–44)
AST: 20 U/L (ref 15–41)
Albumin: 4.1 g/dL (ref 3.5–5.0)
Alkaline Phosphatase: 72 U/L (ref 38–126)
Anion gap: 10 (ref 5–15)
BUN: 18 mg/dL (ref 6–20)
CO2: 26 mmol/L (ref 22–32)
Calcium: 9.7 mg/dL (ref 8.9–10.3)
Chloride: 104 mmol/L (ref 98–111)
Creatinine, Ser: 0.9 mg/dL (ref 0.44–1.00)
GFR, Estimated: >60 mL/min (ref >60)
Glucose, Bld: 130 mg/dL — ABNORMAL HIGH (ref 70–99)
Potassium: 4.6 mmol/L (ref 3.5–5.1)
Sodium: 140 mmol/L (ref 135–145)
Total Bilirubin: 0.7 mg/dL (ref 0.3–1.2)
Total Protein: 7.8 g/dL (ref 6.5–8.1)

## 2022-01-02 LAB — URINALYSIS, ROUTINE W REFLEX MICROSCOPIC
Bacteria, UA: NONE SEEN
Bilirubin Urine: NEGATIVE
Glucose, UA: NEGATIVE mg/dL
Ketones, ur: NEGATIVE mg/dL
Leukocytes,Ua: NEGATIVE
Nitrite: NEGATIVE
Protein, ur: NEGATIVE mg/dL
Specific Gravity, Urine: 1.009 (ref 1.005–1.030)
pH: 7 (ref 5.0–8.0)

## 2022-01-02 LAB — CBG MONITORING, ED
Glucose-Capillary: 39 mg/dL — CL (ref 70–99)
Glucose-Capillary: 175 mg/dL — ABNORMAL HIGH (ref 70–99)

## 2022-01-02 LAB — RESP PANEL BY RT-PCR (FLU A&B, COVID) ARPGX2
Influenza A by PCR: NEGATIVE
Influenza B by PCR: NEGATIVE
SARS Coronavirus 2 by RT PCR: NEGATIVE

## 2022-01-02 LAB — TROPONIN I (HIGH SENSITIVITY): Troponin I (High Sensitivity): <2 ng/L (ref <18)

## 2022-01-02 MED ORDER — MECLIZINE HCL 25 MG PO TABS: 25.0000 mg | ORAL_TABLET | Freq: Three times a day (TID) | ORAL | 0 refills | Status: DC | PRN

## 2022-01-02 MED ORDER — ONDANSETRON 4 MG PO TBDP: 4.0000 mg | ORAL_TABLET | Freq: Three times a day (TID) | ORAL | 0 refills | Status: DC | PRN

## 2022-01-02 MED ORDER — ONDANSETRON HCL 4 MG/2ML IJ SOLN
4.0000 mg | Freq: Once | INTRAMUSCULAR | Status: AC
  Administered 2022-01-02: 4 mg via INTRAVENOUS
  Filled 2022-01-02: qty 2

## 2022-01-02 MED ORDER — MECLIZINE HCL 12.5 MG PO TABS
25.0000 mg | ORAL_TABLET | Freq: Once | ORAL | Status: AC
  Administered 2022-01-02: 25 mg via ORAL
  Filled 2022-01-02: qty 2

## 2022-01-02 MED ORDER — SODIUM CHLORIDE 0.9 % IV BOLUS
1000.0000 mL | Freq: Once | INTRAVENOUS | Status: AC
  Administered 2022-01-02: 1000 mL via INTRAVENOUS

## 2022-01-02 MED ORDER — DEXTROSE 50 % IV SOLN
INTRAVENOUS | Status: AC
  Filled 2022-01-02: qty 50

## 2022-01-02 NOTE — ED Notes (Signed)
Pt given 4 oz of OJ, roll with peanut butter, and two graham crackers with peanut butter. Pt says she feels better after drinking OJ. Dr Langston Masker and primary nurse aware.

## 2022-01-02 NOTE — ED Notes (Signed)
Patient is ambulating with no complaint of dizziness.

## 2022-01-02 NOTE — ED Triage Notes (Signed)
Pt woke up yesterday morning with severe dizziness. Pt's last known well was 11/17 at 2200. Pt states lying in bed with eyes closed helps dizziness. Pt has associated nausea and ShOB.

## 2022-01-02 NOTE — ED Provider Notes (Signed)
[facility] EMERGENCY DEPARTMENT Provider Note   CSN: [id number] Arrival date & time: 01/02/22  1310     History  Chief Complaint  Patient presents with   Dizziness    Amanda Zamora is a 61 y.o. female with history of high cholesterol, diabetes, hypertension, presenting from home with lightheadedness and vertigo.  Supplemental history is provided by the patient's daughter at the bedside.  They report the patient woke up yesterday feeling "very unwell".  She is explicitly describes sensation of lightheadedness, worse with standing, and also vertigo, feeling like the bathroom tiles and floor are slipping and moving underneath her.  She has been very nauseated.  She feels weak.  She denies fevers, chills, cough, congestion.  She does suffer from chronic constipation problems but did have a loose bowel movement yesterday.  No abdominal pain complaints.  No sick contacts in the house.  No significant headache.  She denies prior history of vertigo or stroke.  HPI     Home Medications Prior to Admission medications   Medication Sig Start Date End Date Taking? Authorizing Provider  meclizine (ANTIVERT) 25 MG tablet Take 1 tablet (25 mg total) by mouth 3 (three) times daily as needed for up to 30 doses for dizziness. 01/02/22  Yes Wyvonnia Dusky, MD  ondansetron (ZOFRAN-ODT) 4 MG disintegrating tablet Take 1 tablet (4 mg total) by mouth every 8 (eight) hours as needed for up to 15 doses for nausea or vomiting. 01/02/22  Yes Wyvonnia Dusky, MD  amLODipine (NORVASC) 10 MG tablet Take 1 tablet (10 mg total) by mouth daily. 09/28/21   Soyla Dryer, PA-C  atorvastatin (LIPITOR) 80 MG tablet Tome una tableta por boca al dormir 09/28/21   Soyla Dryer, PA-C  cyclobenzaprine (FLEXERIL) 10 MG tablet TAKE 1 TABLET BY MOUTH TWICE DAILY AS NEEDED FOR MUSCLE SPASM 12/22/21   Soyla Dryer, PA-C  diclofenac Sodium (VOLTAREN) 1 % GEL Apply 2 g topically 4 (four) times daily. Patient not  taking: Reported on 11/24/2021 05/07/20   Soyla Dryer, PA-C  gabapentin (NEURONTIN) 300 MG capsule Take 1 capsule by mouth at bedtime 12/22/21   Soyla Dryer, PA-C  insulin NPH-regular Human (NOVOLIN 70/30 RELION) (70-30) 100 UNIT/ML injection 20 units with breakfast and 20 units with supper if glucose is above 90 and she is eating. 12/22/21   Brita Romp, NP  losartan (COZAAR) 100 MG tablet Take 1 tablet (100 mg total) by mouth daily. 09/28/21   Soyla Dryer, PA-C  metFORMIN (GLUCOPHAGE) 1000 MG tablet TAKE 1 TABLET BY MOUTH TWICE DAILY WITH A MEAL.   Tome una tableta por boca dos veces diarias con comida 12/22/21   Brita Romp, NP  multivitamin-iron-minerals-folic acid (CENTRUM) chewable tablet Chew 1 tablet by mouth daily.    [provider]  Omega-3 Fatty Acids (FISH OIL) 1000 MG CAPS Take 1 capsule (1,000 mg total) by mouth in the morning and at bedtime. Patient taking differently: Take 1 capsule by mouth daily. 04/01/20   Barton Dubois, MD  omeprazole (PRILOSEC) 20 MG capsule Take 1 capsule (20 mg total) by mouth daily. Tome una tableta por boca diaria Patient taking differently: Take 20 mg by mouth daily as needed. Tome una tableta por boca diaria 07/06/21   Soyla Dryer, PA-C      Allergies    Patient has no known allergies.    Review of Systems   Review of Systems  Physical Exam Updated Vital Signs BP 114/70   Pulse 70  Temp 98.2 F (36.8 C) (Oral)   Resp 17   Ht 5' 0.5" (1.537 m)   Wt 71.7 kg   LMP 02/15/2011 (Approximate)   SpO2 98%   BMI 30.35 kg/m  Physical Exam Constitutional:      General: She is not in acute distress. HENT:     Head: Normocephalic and atraumatic.  Eyes:     Conjunctiva/sclera: Conjunctivae normal.     Pupils: Pupils are equal, round, and reactive to light.  Cardiovascular:     Rate and Rhythm: Normal rate and regular rhythm.  Pulmonary:     Effort: Pulmonary effort is normal. No respiratory distress.   Abdominal:     General: There is no distension.     Tenderness: There is no abdominal tenderness.  Skin:    General: Skin is warm and dry.  Neurological:     General: No focal deficit present.     Mental Status: She is alert. Mental status is at baseline.     Comments: Unilateral nystagmus with rightward gaze Test of skew is equivocal     ED Results / Procedures / Treatments   Labs (all labs ordered are listed, but only abnormal results are displayed) Labs Reviewed  COMPREHENSIVE METABOLIC PANEL - Abnormal; Notable for the following components:      Result Value   Glucose, Bld 130 (*)    All other components within normal limits  CBC - Abnormal; Notable for the following components:   Hemoglobin 11.7 (*)    All other components within normal limits  URINALYSIS, ROUTINE W REFLEX MICROSCOPIC - Abnormal; Notable for the following components:   Color, Urine STRAW (*)    Hgb urine dipstick SMALL (*)    All other components within normal limits  CBG MONITORING, ED - Abnormal; Notable for the following components:   Glucose-Capillary 39 (*)    All other components within normal limits  CBG MONITORING, ED - Abnormal; Notable for the following components:   Glucose-Capillary 175 (*)    All other components within normal limits  RESP PANEL BY RT-PCR (FLU A&B, COVID) ARPGX2  TROPONIN I (HIGH SENSITIVITY)    EKG EKG Interpretation  Date/Time:  Sunday January 02 2022 13:34:37 EST Ventricular Rate:  74 PR Interval:  124 QRS Duration: 68 QT Interval:  384 QTC Calculation: 426 R Axis:   86 Text Interpretation: Normal sinus rhythm Normal ECG When compared with ECG of 31-Mar-2020 15:46, No significant change was found Confirmed by Octaviano Glow [phone]) on 01/02/2022 4:18:48 PM  Radiology DG Chest 2 View  Result Date: 01/02/2022 CLINICAL DATA:  Evaluate pneumonia EXAM: CHEST - 2 VIEW COMPARISON:  March 31, 2020 FINDINGS: Heart, hila, mediastinum, lungs, and pleura are  normal. No pneumothorax. No evidence of pneumonia. There is an inferiorly directed osteophyte off the undersurface of the right acromion. IMPRESSION: 1. No active cardiopulmonary disease. 2. Incidentally, there is an inferiorly directed osteophyte off the undersurface of the right acromion. Electronically Signed   By: Dorise Bullion III M.D.   On: 01/02/2022 17:44   CT Head Wo Contrast  Result Date: 01/02/2022 CLINICAL DATA:  Dizziness for 1 day.  Change in mental status. EXAM: CT HEAD WITHOUT CONTRAST TECHNIQUE: Contiguous axial images were obtained from the base of the skull through the vertex without intravenous contrast. RADIATION DOSE REDUCTION: This exam was performed according to the departmental dose-optimization program which includes automated exposure control, adjustment of the mA and/or kV according to patient size and/or use of iterative reconstruction  technique. COMPARISON:  None Available. FINDINGS: Brain: No evidence of intracranial hemorrhage, acute infarction, hydrocephalus, extra-axial collection, or mass lesion/mass effect. Vascular:  No hyperdense vessel or other acute findings. Skull: No evidence of fracture or other significant bone abnormality. Sinuses/Orbits:  No acute findings. Other: None. IMPRESSION: Negative noncontrast head CT. Electronically Signed   By: Marlaine Hind M.D.   On: 01/02/2022 17:39    Procedures Procedures    Medications Ordered in ED Medications  sodium chloride 0.9 % bolus 1,000 mL (1,000 mLs Intravenous New Bag/Given 01/02/22 1825)  ondansetron (ZOFRAN) injection 4 mg (4 mg Intravenous Given 01/02/22 1825)  meclizine (ANTIVERT) tablet 25 mg (25 mg Oral Given 01/02/22 1747)  dextrose 50 % solution (  Given 01/02/22 1938)    ED Course/ Medical Decision Making/ A&P Clinical Course as of 01/02/22 2103  Sun Jan 02, 2022  1936 Patient reportedly was feeling lightheaded and glucose levels 39.  Her daughter at bedside reports the patient did take her  morning insulin but has not eaten anything all day which is unusual for her.  The patient was given an amp of D50 and subsequently I advise she be given juice and food, which she is currently eating.  She is feeling better.  I suspect this is hypoglycemia from not eating.  We will continue to monitor her.  Her vertigo overall is improved with the meclizine. [MT]  2055 The patient's blood sugar improved after she was able to tolerate foods and she was able to ambulate steadily to the bathroom.  She feels significantly improved with the medications in the ED.  I strongly suspect is peripheral vertigo.  I will prescribe her meclizine, advised ENT follow-up.  Zofran also offered for home.  Daughter will take her home now. [MT]    Clinical Course User Index [MT] Margherita Collyer, Carola Rhine, MD                           Medical Decision Making Amount and/or Complexity of Data Reviewed Labs: ordered. Radiology: ordered. ECG/medicine tests: ordered.  Risk Prescription drug management.   This patient presents to the ED with concern for lightheadedness, vertigo, nausea. This involves an extensive number of treatment options, and is a complaint that carries with it a high risk of complications and morbidity.  The differential diagnosis includes viral illness versus symptomatic anemia versus metabolic derangement versus CVA versus other  Co-morbidities that complicate the patient evaluation: History of stroke risk factors, hypertension, diabetes  Additional history obtained from patient's daughter at the bedside  I ordered and personally interpreted labs.  The pertinent results include: No acute emergent findings  I ordered imaging studies including CT head, x-ray of the chest I independently visualized and interpreted imaging which showed no emergent findings I agree with the radiologist interpretation  The patient was maintained on a cardiac monitor.  I personally viewed and interpreted the cardiac  monitored which showed an underlying rhythm of: Sinus rhythm  Per my interpretation the patient's ECG shows sinus rhythm no acute ischemic findings  I ordered medication including IV fluids, meclizine, IV Zofran for hydration, nausea and vertigo treatment  I have reviewed the patients home medicines and have made adjustments as needed  Test Considered: Doubt posterior cerebellar lesion, stroke, meningitis, PE.  After the interventions noted above, I reevaluated the patient and found that they have: improved   Dispostion:  After consideration of the diagnostic results and the patients response to treatment,  I feel that the patent would benefit from outpatient follow-up with ENT for suspected peripheral vertigo.  Patient improved with meclizine in the ED.  This will be prescribed for home.  Her daughter will be taking her home.  Return precautions discussed.  Hypoglycemia is resolved and again I think related to not eating after 7 hours in the ED, given that she took insulin this morning.  She will be eating when she returns home.  She did have food here         Final Clinical Impression(s) / ED Diagnoses Final diagnoses:  Vertigo  Hypoglycemia    Rx / DC Orders ED Discharge Orders          Ordered    meclizine (ANTIVERT) 25 MG tablet  3 times daily PRN        01/02/22 2056    ondansetron (ZOFRAN-ODT) 4 MG disintegrating tablet  Every 8 hours PRN        01/02/22 2056              Wyvonnia Dusky, MD 01/02/22 2103

## 2022-01-03 ENCOUNTER — Ambulatory Visit (INDEPENDENT_AMBULATORY_CARE_PROVIDER_SITE_OTHER): Payer: Self-pay | Admitting: Gastroenterology

## 2022-01-03 ENCOUNTER — Encounter: Payer: Self-pay | Admitting: *Deleted

## 2022-01-03 ENCOUNTER — Encounter: Payer: Self-pay | Admitting: Gastroenterology

## 2022-01-03 VITALS — BP 133/81 | HR 79 | Temp 98.1°F | Ht 62.0 in | Wt 159.4 lb

## 2022-01-03 DIAGNOSIS — K5900 Constipation, unspecified: Secondary | ICD-10-CM | POA: Insufficient documentation

## 2022-01-03 DIAGNOSIS — R933 Abnormal findings on diagnostic imaging of other parts of digestive tract: Secondary | ICD-10-CM

## 2022-01-03 MED ORDER — PEG 3350-KCL-NA BICARB-NACL 420 G PO SOLR: 4000.0000 mL | Freq: Once | ORAL | 0 refills | Status: AC

## 2022-01-03 NOTE — H&P (View-Only) (Signed)
GI Office Note    Referring Provider: Soyla Dryer, PA-C Primary Care Physician:  Soyla Dryer, PA-C  Primary Gastroenterologist: Elon Alas. Abbey Chatters, DO   Chief Complaint   Chief Complaint  Patient presents with   Constipation    Last bm was on Friday     History of Present Illness   Amanda Zamora is a 61 y.o. female presenting today at the request of Soyla Dryer for further evaluation of left lower quadrant pain. Presents with formal Spanish interpretor.   Patient has been dealing with constipation for a long time. BM about twice per week. Prune juice does not work. Takes Pinalim Tea for colon purge at times. Last time used on Thursday. Next day will have 3-4 stools. States she feels bloated a lot. After good BMs, her LLQ pain improves. Usually notes LLQ pain mostly at night. No melena, brbpr. Never tried Miralax or prescriptions for constipation. States her LLQ started gradually several months ago and got worse last month.  Weight down about 8 pounds. Diet and exercising. Better control of DM. Heartburn sometimes, omeprazole as needed helps.   CT abdomen pelvis with contrast October 2023: Focal narrowing of the mid transverse colon with appearance of apple core lesion.  Could reflect focal colonic peristalsis but underlying neoplasm is possible.  Moderate to large stool burden in transverse and descending colon.  CT head yesterday for dizziness was negative.  Chest x-ray yesterday with no active cardiopulmonary disease.  CT A/P 06/2020: colon ok, acute appendicitis at that time  Medications   Current Outpatient Medications  Medication Sig Dispense Refill   amLODipine (NORVASC) 10 MG tablet Take 1 tablet (10 mg total) by mouth daily. 90 tablet 0   atorvastatin (LIPITOR) 80 MG tablet Tome una tableta por boca al dormir 90 tablet 1   cyclobenzaprine (FLEXERIL) 10 MG tablet TAKE 1 TABLET BY MOUTH TWICE DAILY AS NEEDED FOR MUSCLE SPASM 20 tablet 0   gabapentin  (NEURONTIN) 300 MG capsule Take 1 capsule by mouth at bedtime 30 capsule 0   insulin NPH-regular Human (NOVOLIN 70/30 RELION) (70-30) 100 UNIT/ML injection 20 units with breakfast and 20 units with supper if glucose is above 90 and she is eating. 30 mL 3   losartan (COZAAR) 100 MG tablet Take 1 tablet (100 mg total) by mouth daily. 90 tablet 1   meclizine (ANTIVERT) 25 MG tablet Take 1 tablet (25 mg total) by mouth 3 (three) times daily as needed for up to 30 doses for dizziness. 30 tablet 0   metFORMIN (GLUCOPHAGE) 1000 MG tablet TAKE 1 TABLET BY MOUTH TWICE DAILY WITH A MEAL.   Tome una tableta por boca dos veces diarias con comida 180 tablet 3   multivitamin-iron-minerals-folic acid (CENTRUM) chewable tablet Chew 1 tablet by mouth daily.     Omega-3 Fatty Acids (FISH OIL) 1000 MG CAPS Take 1 capsule (1,000 mg total) by mouth in the morning and at bedtime. (Patient taking differently: Take 1 capsule by mouth daily.) 60 capsule 2   omeprazole (PRILOSEC) 20 MG capsule Take 1 capsule (20 mg total) by mouth daily. Tome una tableta por boca diaria (Patient taking differently: Take 20 mg by mouth daily as needed. Tome una tableta por boca diaria) 90 capsule 1   ondansetron (ZOFRAN-ODT) 4 MG disintegrating tablet Take 1 tablet (4 mg total) by mouth every 8 (eight) hours as needed for up to 15 doses for nausea or vomiting. 15 tablet 0   No current facility-administered medications  for this visit.    Allergies   Allergies as of 01/03/2022   (No Known Allergies)    Past Medical History   Past Medical History:  Diagnosis Date   Detached retina    GERD (gastroesophageal reflux disease)    History of pyelonephritis    PONV (postoperative nausea and vomiting)    Type 2 diabetes mellitus ([place])     Past Surgical History   Past Surgical History:  Procedure Laterality Date   CHOLECYSTECTOMY N/A 12/23/2019   Procedure: LAPAROSCOPIC CHOLECYSTECTOMY;  Surgeon: Virl Cagey, MD;  Location: [facility];  Service: General;  Laterality: N/A;   DENTAL SURGERY  06/01/2021   Removed 5 teeth   EYE SURGERY Right    retina   LAPAROSCOPIC APPENDECTOMY N/A 07/05/2020   Procedure: APPENDECTOMY LAPAROSCOPIC;  Surgeon: Aviva Signs, MD;  Location: [facility];  Service: General;  Laterality: N/A;   TUBAL LIGATION      Past Family History   Family History  Problem Relation Age of Onset   Breast cancer Sister    Diabetes Sister    Colonic polyp Neg Hx     Past Social History   Social History   Socioeconomic History   Marital status: Single    Spouse name: Not on file   Number of children: Not on file   Years of education: Not on file   Highest education level: Not on file  Occupational History   Not on file  Tobacco Use   Smoking status: Never   Smokeless tobacco: Never  Vaping Use   Vaping Use: Never used  Substance and Sexual Activity   Alcohol use: Not Currently    Comment: previously would drink occ. beer last drink 06/2017   Drug use: No   Sexual activity: Yes    Birth control/protection: None  Other Topics Concern   Not on file  Social History Narrative   Not on file   Social Determinants of Health   Financial Resource Strain: Not on file  Food Insecurity: Not on file  Transportation Needs: Not on file  Physical Activity: Not on file  Stress: Not on file  Social Connections: Not on file  Intimate Partner Violence: Not on file    Review of Systems   General: Negative for anorexia, weight loss, fever, chills, fatigue, weakness. Eyes: Negative for vision changes.  ENT: Negative for hoarseness, difficulty swallowing , nasal congestion. CV: Negative for chest pain, angina, palpitations, dyspnea on exertion, peripheral edema.  Respiratory: Negative for dyspnea at rest, dyspnea on exertion, cough, sputum, wheezing.  GI: See history of present illness. GU:  Negative for dysuria, hematuria, urinary incontinence, urinary frequency, nocturnal urination.  MS: Negative  for joint pain, low back pain.  Derm: Negative for rash or itching.  Neuro: Negative for weakness, abnormal sensation, seizure, frequent headaches, memory loss,  confusion. See hpi Psych: Negative for anxiety, depression, suicidal ideation, hallucinations.  Endo: Negative for unusual weight change.  Heme: Negative for bruising or bleeding. Allergy: Negative for rash or hives.  Physical Exam   BP 133/81 (BP Location: Right Arm, Patient Position: Sitting, Cuff Size: Normal)   Pulse 79   Temp 98.1 F (36.7 C) (Oral)   Ht '5\' 2"'$  (1.575 m)   Wt 159 lb 6.4 oz (72.3 kg)   LMP 02/15/2011 (Approximate)   SpO2 98%   BMI 29.15 kg/m    General: Well-nourished, well-developed in no acute distress.  Head: Normocephalic, atraumatic.   Eyes: Conjunctiva pink, no icterus.  Mouth: Oropharyngeal mucosa moist and pink , no lesions erythema or exudate. Neck: Supple without thyromegaly, masses, or lymphadenopathy.  Lungs: Clear to auscultation bilaterally.  Heart: Regular rate and rhythm, no murmurs rubs or gallops.  Abdomen: Bowel sounds are normal, nontender, nondistended, no hepatosplenomegaly or masses, no abdominal bruits or hernia, no rebound or guarding.   Rectal: not performed Extremities: No lower extremity edema. No clubbing or deformities.  Neuro: Alert and oriented x 4 , grossly normal neurologically.  Skin: Warm and dry, no rash or jaundice.   Psych: Alert and cooperative, normal mood and affect.  Labs   Lab Results  Component Value Date   CREATININE 0.90 01/02/2022   BUN 18 01/02/2022   NA 140 01/02/2022   K 4.6 01/02/2022   CL 104 01/02/2022   CO2 26 01/02/2022   Lab Results  Component Value Date   ALT 18 01/02/2022   AST 20 01/02/2022   ALKPHOS 72 01/02/2022   BILITOT 0.7 01/02/2022   Lab Results  Component Value Date   WBC 7.5 01/02/2022   HGB 11.7 (L) 01/02/2022   HCT 36.1 01/02/2022   MCV 88.7 01/02/2022   PLT 293 01/02/2022   Lab Results  Component Value  Date   HGBA1C 7.0 (A) 12/22/2021    Imaging Studies   DG Chest 2 View  Result Date: 01/02/2022 CLINICAL DATA:  Evaluate pneumonia EXAM: CHEST - 2 VIEW COMPARISON:  March 31, 2020 FINDINGS: Heart, hila, mediastinum, lungs, and pleura are normal. No pneumothorax. No evidence of pneumonia. There is an inferiorly directed osteophyte off the undersurface of the right acromion. IMPRESSION: 1. No active cardiopulmonary disease. 2. Incidentally, there is an inferiorly directed osteophyte off the undersurface of the right acromion. Electronically Signed   By: Dorise Bullion III M.D.   On: 01/02/2022 17:44   CT Head Wo Contrast  Result Date: 01/02/2022 CLINICAL DATA:  Dizziness for 1 day.  Change in mental status. EXAM: CT HEAD WITHOUT CONTRAST TECHNIQUE: Contiguous axial images were obtained from the base of the skull through the vertex without intravenous contrast. RADIATION DOSE REDUCTION: This exam was performed according to the departmental dose-optimization program which includes automated exposure control, adjustment of the mA and/or kV according to patient size and/or use of iterative reconstruction technique. COMPARISON:  None Available. FINDINGS: Brain: No evidence of intracranial hemorrhage, acute infarction, hydrocephalus, extra-axial collection, or mass lesion/mass effect. Vascular:  No hyperdense vessel or other acute findings. Skull: No evidence of fracture or other significant bone abnormality. Sinuses/Orbits:  No acute findings. Other: None. IMPRESSION: Negative noncontrast head CT. Electronically Signed   By: Marlaine Hind M.D.   On: 01/02/2022 17:39    Assessment   Constipation/abnormal CT of colon: progressive chronic constipation with LLQ tenderness. CT shows large stool burden in the transverse and descending colon suggesting constipation.  There was focal narrowing of the mid transverse colon, could reflect focal colonic peristalsis but underlying neoplasm not excluded.  Colonoscopy  has been advised.   PLAN   For constipation, starting MiraLAX 1 capful twice daily.  She can continue to use Pinalim on occasion. Also have provided her with one sample bottle of Linzess to use starting four days before colonoscopy bowel prep if needed to have regular stools.   Colonoscopy with Dr. Abbey Chatters. ASA 2.  I have discussed the risks, alternatives, benefits with regards to but not limited to the risk of reaction to medication, bleeding, infection, perforation and the patient is agreeable to proceed. Written consent to  be obtained.    Laureen Ochs. Bobby Rumpf, [place], [facility]

## 2022-01-03 NOTE — Patient Instructions (Addendum)
Colonoscopy to be scheduled. see separate instructions. We are also providing you with a sample bottle of Linzess that you will start taking (one daily) four days before your colonoscopy prep. Linzess is to help make sure your bowels are moving well before you start your colonoscopy bowel prep. You do not have to take every day if you are having frequent stools.   For constipation, take Miralax one capful (mixed in 4 ounces of water) twice daily. This is purchased over the counter.     Colonoscopia por programar. consulte instrucciones separadas. Tambin le proporcionamos una botella de [place] de Linzess que comenzar a tomar (una al da) [employer] antes de la preparacin de su colonoscopia.Linzess es para ayudar a Higher education careers adviser sus intestinos se muevan bien antes de comenzar la preparacin intestinal para la colonoscopia. No es necesario que lo tome [employer] si tiene deposiciones frecuentes.  Para el estreimiento, tome una tapa de Miralax (Colon en 4 onzas de agua) [place]. Esto se compra sin receta.

## 2022-01-03 NOTE — Progress Notes (Signed)
GI Office Note    Referring Provider: Soyla Dryer, PA-C Primary Care Physician:  Soyla Dryer, PA-C  Primary Gastroenterologist: Elon Alas. Abbey Chatters, DO   Chief Complaint   Chief Complaint  Patient presents with   Constipation    Last bm was on Friday     History of Present Illness   Amanda Zamora is a 61 y.o. female presenting today at the request of Soyla Dryer for further evaluation of left lower quadrant pain. Presents with formal Spanish interpretor.   Patient has been dealing with constipation for a long time. BM about twice per week. Prune juice does not work. Takes Pinalim Tea for colon purge at times. Last time used on Thursday. Next day will have 3-4 stools. States she feels bloated a lot. After good BMs, her LLQ pain improves. Usually notes LLQ pain mostly at night. No melena, brbpr. Never tried Miralax or prescriptions for constipation. States her LLQ started gradually several months ago and got worse last month.  Weight down about 8 pounds. Diet and exercising. Better control of DM. Heartburn sometimes, omeprazole as needed helps.   CT abdomen pelvis with contrast October 2023: Focal narrowing of the mid transverse colon with appearance of apple core lesion.  Could reflect focal colonic peristalsis but underlying neoplasm is possible.  Moderate to large stool burden in transverse and descending colon.  CT head yesterday for dizziness was negative.  Chest x-ray yesterday with no active cardiopulmonary disease.  CT A/P 06/2020: colon ok, acute appendicitis at that time  Medications   Current Outpatient Medications  Medication Sig Dispense Refill   amLODipine (NORVASC) 10 MG tablet Take 1 tablet (10 mg total) by mouth daily. 90 tablet 0   atorvastatin (LIPITOR) 80 MG tablet Tome una tableta por boca al dormir 90 tablet 1   cyclobenzaprine (FLEXERIL) 10 MG tablet TAKE 1 TABLET BY MOUTH TWICE DAILY AS NEEDED FOR MUSCLE SPASM 20 tablet 0   gabapentin  (NEURONTIN) 300 MG capsule Take 1 capsule by mouth at bedtime 30 capsule 0   insulin NPH-regular Human (NOVOLIN 70/30 RELION) (70-30) 100 UNIT/ML injection 20 units with breakfast and 20 units with supper if glucose is above 90 and she is eating. 30 mL 3   losartan (COZAAR) 100 MG tablet Take 1 tablet (100 mg total) by mouth daily. 90 tablet 1   meclizine (ANTIVERT) 25 MG tablet Take 1 tablet (25 mg total) by mouth 3 (three) times daily as needed for up to 30 doses for dizziness. 30 tablet 0   metFORMIN (GLUCOPHAGE) 1000 MG tablet TAKE 1 TABLET BY MOUTH TWICE DAILY WITH A MEAL.   Tome una tableta por boca dos veces diarias con comida 180 tablet 3   multivitamin-iron-minerals-folic acid (CENTRUM) chewable tablet Chew 1 tablet by mouth daily.     Omega-3 Fatty Acids (FISH OIL) 1000 MG CAPS Take 1 capsule (1,000 mg total) by mouth in the morning and at bedtime. (Patient taking differently: Take 1 capsule by mouth daily.) 60 capsule 2   omeprazole (PRILOSEC) 20 MG capsule Take 1 capsule (20 mg total) by mouth daily. Tome una tableta por boca diaria (Patient taking differently: Take 20 mg by mouth daily as needed. Tome una tableta por boca diaria) 90 capsule 1   ondansetron (ZOFRAN-ODT) 4 MG disintegrating tablet Take 1 tablet (4 mg total) by mouth every 8 (eight) hours as needed for up to 15 doses for nausea or vomiting. 15 tablet 0   No current facility-administered medications  for this visit.    Allergies   Allergies as of 01/03/2022   (No Known Allergies)    Past Medical History   Past Medical History:  Diagnosis Date   Detached retina    GERD (gastroesophageal reflux disease)    History of pyelonephritis    PONV (postoperative nausea and vomiting)    Type 2 diabetes mellitus ([place])     Past Surgical History   Past Surgical History:  Procedure Laterality Date   CHOLECYSTECTOMY N/A 12/23/2019   Procedure: LAPAROSCOPIC CHOLECYSTECTOMY;  Surgeon: Virl Cagey, MD;  Location: [facility];  Service: General;  Laterality: N/A;   DENTAL SURGERY  06/01/2021   Removed 5 teeth   EYE SURGERY Right    retina   LAPAROSCOPIC APPENDECTOMY N/A 07/05/2020   Procedure: APPENDECTOMY LAPAROSCOPIC;  Surgeon: Aviva Signs, MD;  Location: [facility];  Service: General;  Laterality: N/A;   TUBAL LIGATION      Past Family History   Family History  Problem Relation Age of Onset   Breast cancer Sister    Diabetes Sister    Colonic polyp Neg Hx     Past Social History   Social History   Socioeconomic History   Marital status: Single    Spouse name: Not on file   Number of children: Not on file   Years of education: Not on file   Highest education level: Not on file  Occupational History   Not on file  Tobacco Use   Smoking status: Never   Smokeless tobacco: Never  Vaping Use   Vaping Use: Never used  Substance and Sexual Activity   Alcohol use: Not Currently    Comment: previously would drink occ. beer last drink 06/2017   Drug use: No   Sexual activity: Yes    Birth control/protection: None  Other Topics Concern   Not on file  Social History Narrative   Not on file   Social Determinants of Health   Financial Resource Strain: Not on file  Food Insecurity: Not on file  Transportation Needs: Not on file  Physical Activity: Not on file  Stress: Not on file  Social Connections: Not on file  Intimate Partner Violence: Not on file    Review of Systems   General: Negative for anorexia, weight loss, fever, chills, fatigue, weakness. Eyes: Negative for vision changes.  ENT: Negative for hoarseness, difficulty swallowing , nasal congestion. CV: Negative for chest pain, angina, palpitations, dyspnea on exertion, peripheral edema.  Respiratory: Negative for dyspnea at rest, dyspnea on exertion, cough, sputum, wheezing.  GI: See history of present illness. GU:  Negative for dysuria, hematuria, urinary incontinence, urinary frequency, nocturnal urination.  MS: Negative  for joint pain, low back pain.  Derm: Negative for rash or itching.  Neuro: Negative for weakness, abnormal sensation, seizure, frequent headaches, memory loss,  confusion. See hpi Psych: Negative for anxiety, depression, suicidal ideation, hallucinations.  Endo: Negative for unusual weight change.  Heme: Negative for bruising or bleeding. Allergy: Negative for rash or hives.  Physical Exam   BP 133/81 (BP Location: Right Arm, Patient Position: Sitting, Cuff Size: Normal)   Pulse 79   Temp 98.1 F (36.7 C) (Oral)   Ht '5\' 2"'$  (1.575 m)   Wt 159 lb 6.4 oz (72.3 kg)   LMP 02/15/2011 (Approximate)   SpO2 98%   BMI 29.15 kg/m    General: Well-nourished, well-developed in no acute distress.  Head: Normocephalic, atraumatic.   Eyes: Conjunctiva pink, no icterus.  Mouth: Oropharyngeal mucosa moist and pink , no lesions erythema or exudate. Neck: Supple without thyromegaly, masses, or lymphadenopathy.  Lungs: Clear to auscultation bilaterally.  Heart: Regular rate and rhythm, no murmurs rubs or gallops.  Abdomen: Bowel sounds are normal, nontender, nondistended, no hepatosplenomegaly or masses, no abdominal bruits or hernia, no rebound or guarding.   Rectal: not performed Extremities: No lower extremity edema. No clubbing or deformities.  Neuro: Alert and oriented x 4 , grossly normal neurologically.  Skin: Warm and dry, no rash or jaundice.   Psych: Alert and cooperative, normal mood and affect.  Labs   Lab Results  Component Value Date   CREATININE 0.90 01/02/2022   BUN 18 01/02/2022   NA 140 01/02/2022   K 4.6 01/02/2022   CL 104 01/02/2022   CO2 26 01/02/2022   Lab Results  Component Value Date   ALT 18 01/02/2022   AST 20 01/02/2022   ALKPHOS 72 01/02/2022   BILITOT 0.7 01/02/2022   Lab Results  Component Value Date   WBC 7.5 01/02/2022   HGB 11.7 (L) 01/02/2022   HCT 36.1 01/02/2022   MCV 88.7 01/02/2022   PLT 293 01/02/2022   Lab Results  Component Value  Date   HGBA1C 7.0 (A) 12/22/2021    Imaging Studies   DG Chest 2 View  Result Date: 01/02/2022 CLINICAL DATA:  Evaluate pneumonia EXAM: CHEST - 2 VIEW COMPARISON:  March 31, 2020 FINDINGS: Heart, hila, mediastinum, lungs, and pleura are normal. No pneumothorax. No evidence of pneumonia. There is an inferiorly directed osteophyte off the undersurface of the right acromion. IMPRESSION: 1. No active cardiopulmonary disease. 2. Incidentally, there is an inferiorly directed osteophyte off the undersurface of the right acromion. Electronically Signed   By: Dorise Bullion III M.D.   On: 01/02/2022 17:44   CT Head Wo Contrast  Result Date: 01/02/2022 CLINICAL DATA:  Dizziness for 1 day.  Change in mental status. EXAM: CT HEAD WITHOUT CONTRAST TECHNIQUE: Contiguous axial images were obtained from the base of the skull through the vertex without intravenous contrast. RADIATION DOSE REDUCTION: This exam was performed according to the departmental dose-optimization program which includes automated exposure control, adjustment of the mA and/or kV according to patient size and/or use of iterative reconstruction technique. COMPARISON:  None Available. FINDINGS: Brain: No evidence of intracranial hemorrhage, acute infarction, hydrocephalus, extra-axial collection, or mass lesion/mass effect. Vascular:  No hyperdense vessel or other acute findings. Skull: No evidence of fracture or other significant bone abnormality. Sinuses/Orbits:  No acute findings. Other: None. IMPRESSION: Negative noncontrast head CT. Electronically Signed   By: Marlaine Hind M.D.   On: 01/02/2022 17:39    Assessment   Constipation/abnormal CT of colon: progressive chronic constipation with LLQ tenderness. CT shows large stool burden in the transverse and descending colon suggesting constipation.  There was focal narrowing of the mid transverse colon, could reflect focal colonic peristalsis but underlying neoplasm not excluded.  Colonoscopy  has been advised.   PLAN   For constipation, starting MiraLAX 1 capful twice daily.  She can continue to use Pinalim on occasion. Also have provided her with one sample bottle of Linzess to use starting four days before colonoscopy bowel prep if needed to have regular stools.   Colonoscopy with Dr. Abbey Chatters. ASA 2.  I have discussed the risks, alternatives, benefits with regards to but not limited to the risk of reaction to medication, bleeding, infection, perforation and the patient is agreeable to proceed. Written consent to  be obtained.    Laureen Ochs. Bobby Rumpf, [place], [facility]

## 2022-01-07 ENCOUNTER — Ambulatory Visit (HOSPITAL_COMMUNITY): Payer: Self-pay

## 2022-01-12 ENCOUNTER — Telehealth: Payer: Self-pay | Admitting: Student

## 2022-01-12 NOTE — Telephone Encounter (Signed)
Pt's daughter Amanda Zamora called stating pt went to ER and was dx with Vertigo - ER recommended for pt to go to ENT specialist. Amanda Zamora states she called to get pt scheduled and was advised to contact pt's PCP for referral.  PA reviewed ER notes and recommends for pt to schedule appt to f/u in office. Pt's blood sugar when at the ER was 39 as may be a factor to pt's symptoms.   Pt is scheduled to come in on wed. Dec. 6 and is asked to bring blood sugar log with her to her appt.

## 2022-01-19 ENCOUNTER — Ambulatory Visit: Payer: Self-pay | Admitting: Physician Assistant

## 2022-01-19 ENCOUNTER — Encounter: Payer: Self-pay | Admitting: Physician Assistant

## 2022-01-19 VITALS — BP 139/80 | HR 75 | Temp 96.5°F | Wt 157.5 lb

## 2022-01-19 DIAGNOSIS — H8110 Benign paroxysmal vertigo, unspecified ear: Secondary | ICD-10-CM

## 2022-01-19 DIAGNOSIS — Z789 Other specified health status: Secondary | ICD-10-CM

## 2022-01-19 MED ORDER — METFORMIN HCL 1000 MG PO TABS: ORAL_TABLET | ORAL | 1 refills | Status: DC

## 2022-01-19 MED ORDER — GABAPENTIN 300 MG PO CAPS: 300.0000 mg | ORAL_CAPSULE | Freq: Every day | ORAL | 0 refills | Status: DC

## 2022-01-19 MED ORDER — MECLIZINE HCL 25 MG PO TABS: 25.0000 mg | ORAL_TABLET | Freq: Three times a day (TID) | ORAL | 0 refills | Status: DC | PRN

## 2022-01-19 NOTE — Progress Notes (Signed)
BP 139/80   Pulse 75   Temp (!) 96.5 F (35.8 C)   Wt 157 lb 8 oz (71.4 kg)   LMP 02/15/2011 (Approximate)   SpO2 96%   BMI 28.81 kg/m    Subjective:    Patient ID: Amanda Zamora, female    DOB: 1960-09-30, 61 y.o.   MRN: [id number]  HPI: Amanda Zamora is a 61 y.o. female presenting on 01/19/2022 for No chief complaint on file.   HPI    Pt is in today for follow up from ER on 01/02/22.    She is feeling well today.    When she bends over she feels light-headed.   If she doesn't bend over, she feels fine.  She has been taking meclizine.     She started feelig bad like this on Saturday 11/18, the day before she went to ER.   She had been seen in office 11/14 and only complaint was constipation.   She missed her mammogram on 11/24.  She says she has appt to follow up with her eye doctor  She says she feels well just sitting in her chair today.     She feels like the meclizine helps her symtpoms.          Relevant past medical, surgical, family and social history reviewed and updated as indicated. Interim medical history since our last visit reviewed. Allergies and medications reviewed and updated.    Current Outpatient Medications:    atorvastatin (LIPITOR) 80 MG tablet, Tome una tableta por boca al dormir, Disp: 90 tablet, Rfl: 1   gabapentin (NEURONTIN) 300 MG capsule, Take 1 capsule by mouth at bedtime, Disp: 30 capsule, Rfl: 0   insulin NPH-regular Human (NOVOLIN 70/30 RELION) (70-30) 100 UNIT/ML injection, 20 units with breakfast and 20 units with supper if glucose is above 90 and she is eating., Disp: 30 mL, Rfl: 3   losartan (COZAAR) 100 MG tablet, Take 1 tablet (100 mg total) by mouth daily., Disp: 90 tablet, Rfl: 1   meclizine (ANTIVERT) 25 MG tablet, Take 1 tablet (25 mg total) by mouth 3 (three) times daily as needed for up to 30 doses for dizziness., Disp: 30 tablet, Rfl: 0   amLODipine (NORVASC) 10 MG tablet, Take 1 tablet (10 mg total) by mouth  daily., Disp: 90 tablet, Rfl: 0   cyclobenzaprine (FLEXERIL) 10 MG tablet, TAKE 1 TABLET BY MOUTH TWICE DAILY AS NEEDED FOR MUSCLE SPASM, Disp: 20 tablet, Rfl: 0   metFORMIN (GLUCOPHAGE) 1000 MG tablet, TAKE 1 TABLET BY MOUTH TWICE DAILY WITH A MEAL.   Tome una tableta por boca dos veces diarias con comida, Disp: 180 tablet, Rfl: 3   multivitamin-iron-minerals-folic acid (CENTRUM) chewable tablet, Chew 1 tablet by mouth daily., Disp: , Rfl:    Omega-3 Fatty Acids (FISH OIL) 1000 MG CAPS, Take 1 capsule (1,000 mg total) by mouth in the morning and at bedtime. (Patient taking differently: Take 1 capsule by mouth daily.), Disp: 60 capsule, Rfl: 2   omeprazole (PRILOSEC) 20 MG capsule, Take 1 capsule (20 mg total) by mouth daily. Tome una tableta por boca diaria (Patient taking differently: Take 20 mg by mouth daily as needed. Tome una tableta por boca diaria), Disp: 90 capsule, Rfl: 1   ondansetron (ZOFRAN-ODT) 4 MG disintegrating tablet, Take 1 tablet (4 mg total) by mouth every 8 (eight) hours as needed for up to 15 doses for nausea or vomiting., Disp: 15 tablet, Rfl: 0   Review of Systems  Per HPI unless specifically indicated above     Objective:    BP 139/80   Pulse 75   Temp (!) 96.5 F (35.8 C)   Wt 157 lb 8 oz (71.4 kg)   LMP 02/15/2011 (Approximate)   SpO2 96%   BMI 28.81 kg/m   Wt Readings from Last 3 Encounters:  01/19/22 157 lb 8 oz (71.4 kg)  01/03/22 159 lb 6.4 oz (72.3 kg)  01/02/22 158 lb (71.7 kg)     Orthostatic vs Supine    122 79    66 Seated   116 78    76 Standing    116 77    80    Physical Exam Vitals reviewed.  Constitutional:      General: She is not in acute distress.    Appearance: She is well-developed. She is not toxic-appearing.  HENT:     Head: Normocephalic and atraumatic.     Right Ear: Hearing, tympanic membrane, ear canal and external ear normal.     Left Ear: Hearing, tympanic membrane, ear canal and external ear normal.     Nose: Nose  normal.     Mouth/Throat:     Pharynx: Uvula midline. No oropharyngeal exudate.  Eyes:     Extraocular Movements:     Right eye: No nystagmus.     Left eye: No nystagmus.     Comments: EOM testing horizontally does not produce dizziness but moving eyes up and down causes significant dizziness for pt  Cardiovascular:     Rate and Rhythm: Normal rate and regular rhythm.  Pulmonary:     Effort: Pulmonary effort is normal.     Breath sounds: Normal breath sounds. No wheezing.  Abdominal:     General: Bowel sounds are normal.     Palpations: Abdomen is soft. There is no mass.     Tenderness: There is no abdominal tenderness.  Musculoskeletal:     Cervical back: Neck supple.     Right lower leg: No edema.     Left lower leg: No edema.  Lymphadenopathy:     Cervical: No cervical adenopathy.  Skin:    General: Skin is warm and dry.  Neurological:     Mental Status: She is alert and oriented to person, place, and time.  Psychiatric:        Behavior: Behavior normal.           Assessment & Plan:    Encounter Diagnoses  Name Primary?   Benign paroxysmal positional vertigo, unspecified laterality Yes   Not proficient in English language       -Refer to ENT -pt has active [employer] financial assistance through 06/14/2022 -pt to Continue meclizine prn.  She has reading info given in ER

## 2022-01-20 ENCOUNTER — Other Ambulatory Visit: Payer: Self-pay | Admitting: Physician Assistant

## 2022-01-26 ENCOUNTER — Other Ambulatory Visit: Payer: Self-pay | Admitting: Physician Assistant

## 2022-02-01 ENCOUNTER — Ambulatory Visit (HOSPITAL_COMMUNITY): Payer: Self-pay | Admitting: Anesthesiology

## 2022-02-01 ENCOUNTER — Ambulatory Visit (HOSPITAL_BASED_OUTPATIENT_CLINIC_OR_DEPARTMENT_OTHER): Payer: Self-pay | Admitting: Anesthesiology

## 2022-02-01 ENCOUNTER — Ambulatory Visit (HOSPITAL_COMMUNITY)
Admission: RE | Admit: 2022-02-01 | Discharge: 2022-02-01 | Disposition: A | Discharge status: Home / Self Care | Payer: Self-pay | Attending: Internal Medicine | Admitting: Internal Medicine

## 2022-02-01 ENCOUNTER — Encounter (HOSPITAL_COMMUNITY)
Admission: RE | Disposition: A | Discharge status: Home / Self Care | Payer: Self-pay | Source: Home / Self Care | Attending: Internal Medicine

## 2022-02-01 ENCOUNTER — Encounter (HOSPITAL_COMMUNITY): Payer: Self-pay

## 2022-02-01 ENCOUNTER — Other Ambulatory Visit: Payer: Self-pay | Admitting: Physician Assistant

## 2022-02-01 ENCOUNTER — Other Ambulatory Visit: Payer: Self-pay

## 2022-02-01 DIAGNOSIS — E119 Type 2 diabetes mellitus without complications: Secondary | ICD-10-CM | POA: Insufficient documentation

## 2022-02-01 DIAGNOSIS — D120 Benign neoplasm of cecum: Secondary | ICD-10-CM

## 2022-02-01 DIAGNOSIS — K635 Polyp of colon: Secondary | ICD-10-CM

## 2022-02-01 DIAGNOSIS — K219 Gastro-esophageal reflux disease without esophagitis: Secondary | ICD-10-CM | POA: Insufficient documentation

## 2022-02-01 DIAGNOSIS — R933 Abnormal findings on diagnostic imaging of other parts of digestive tract: Secondary | ICD-10-CM | POA: Insufficient documentation

## 2022-02-01 DIAGNOSIS — Z79899 Other long term (current) drug therapy: Secondary | ICD-10-CM | POA: Insufficient documentation

## 2022-02-01 DIAGNOSIS — K5900 Constipation, unspecified: Secondary | ICD-10-CM | POA: Insufficient documentation

## 2022-02-01 DIAGNOSIS — I10 Essential (primary) hypertension: Secondary | ICD-10-CM | POA: Insufficient documentation

## 2022-02-01 DIAGNOSIS — D122 Benign neoplasm of ascending colon: Secondary | ICD-10-CM | POA: Insufficient documentation

## 2022-02-01 HISTORY — PX: POLYPECTOMY: SHX5525

## 2022-02-01 HISTORY — PX: COLONOSCOPY WITH PROPOFOL: SHX5780

## 2022-02-01 LAB — GLUCOSE, CAPILLARY: Glucose-Capillary: 149 mg/dL — ABNORMAL HIGH (ref 70–99)

## 2022-02-01 SURGERY — COLONOSCOPY WITH PROPOFOL
Anesthesia: General

## 2022-02-01 MED ORDER — LACTATED RINGERS IV SOLN: INTRAVENOUS | Status: DC

## 2022-02-01 MED ORDER — LACTATED RINGERS IV SOLN: INTRAVENOUS | Status: DC | PRN

## 2022-02-01 MED ORDER — LIDOCAINE HCL (CARDIAC) PF 100 MG/5ML IV SOSY
PREFILLED_SYRINGE | INTRAVENOUS | Status: DC | PRN
  Administered 2022-02-01: 50 mg via INTRAVENOUS

## 2022-02-01 MED ORDER — PROPOFOL 10 MG/ML IV BOLUS
INTRAVENOUS | Status: DC | PRN
  Administered 2022-02-01: 40 mg via INTRAVENOUS
  Administered 2022-02-01: 50 mg via INTRAVENOUS
  Administered 2022-02-01: 30 mg via INTRAVENOUS
  Administered 2022-02-01: 10 mg via INTRAVENOUS
  Administered 2022-02-01: 40 mg via INTRAVENOUS
  Administered 2022-02-01: 50 mg via INTRAVENOUS
  Administered 2022-02-01: 100 mg via INTRAVENOUS
  Administered 2022-02-01: 50 mg via INTRAVENOUS

## 2022-02-01 NOTE — Op Note (Signed)
[facility] Patient Name: Amanda Zamora Procedure Date: 02/01/2022 9:57 AM MRN: [id number] Date of Birth: January 17, 1961 Attending MD: Elon Alas. Abbey Chatters , [place], [id number] CSN: [id number] Age: 61 Admit Type: Outpatient Procedure:                Colonoscopy Indications:              Abnormal CT of the GI tract, Constipation Providers:                Elon Alas. Abbey Chatters, DO, Caprice Kluver, Raphael Gibney,                            Technician Referring MD:              Medicines:                See the Anesthesia note for documentation of the                            administered medications Complications:            No immediate complications. Estimated Blood Loss:     Estimated blood loss was minimal. Procedure:                Pre-Anesthesia Assessment:                           - The anesthesia plan was to use monitored                            anesthesia care (MAC).                           After obtaining informed consent, the colonoscope                            was passed under direct vision. Throughout the                            procedure, the patient's blood pressure, pulse, and                            oxygen saturations were monitored continuously. The                            [id number]-HQ190L ([id number]) scope was introduced through                            the anus and advanced to the the cecum, identified                            by appendiceal orifice and ileocecal valve. The                            colonoscopy was performed without difficulty. The                            patient tolerated the procedure  well. The quality                            of the bowel preparation was evaluated using the                            BBPS [facility] Bowel Preparation Scale) with scores                            of: Right Colon = 2 (minor amount of residual                            staining, small fragments of stool and/or opaque                            liquid, but  mucosa seen well), Transverse Colon = 2                            (minor amount of residual staining, small fragments                            of stool and/or opaque liquid, but mucosa seen                            well) and Left Colon = 2 (minor amount of residual                            staining, small fragments of stool and/or opaque                            liquid, but mucosa seen well). The total BBPS score                            equals 6. Fair. Scope In: 03:54:65 AM Scope Out: 10:33:39 AM Scope Withdrawal Time: 0 hours 24 minutes 15 seconds  Total Procedure Duration: 0 hours 27 minutes 21 seconds  Findings:      The perianal and digital rectal examinations were normal.      A 12 mm polyp was found in the ileocecal valve. The polyp was sessile.       The polyp was removed with a cold snare. Resection and retrieval were       complete.      Two sessile polyps were found in the ascending colon. The polyps were 3       to 4 mm in size. These polyps were removed with a cold snare. Resection       for both was complete. 1 polyp NOT retrieved      A moderate amount of food particles (nuts seeds) was found in the entire       colon, making visualization difficult. Lavage of the area was performed       using copious amounts of sterile water, resulting in clearance with fair       visualization. Scope needed to be replaced with second scope due to       getting clogged with  seeds Impression:               - One 12 mm polyp at the ileocecal valve, removed                            with a cold snare. Resected and retrieved.                           - Two 3 to 4 mm polyps in the ascending colon,                            removed with a cold snare. Resected and retrieved.                           - Stool in the entire examined colon. Moderate Sedation:      Per Anesthesia Care Recommendation:           - Patient has a contact number available for                             emergencies. The signs and symptoms of potential                            delayed complications were discussed with the                            patient. Return to normal activities tomorrow.                            Written discharge instructions were provided to the                            patient.                           - Resume previous diet.                           - Continue present medications.                           - Await pathology results.                           - Repeat colonoscopy in 3 years for surveillance. 2                            day extended prep. DO NOT EAT NUTS AND SEEDS x7                            DAYS PRIOR                           - Return to GI clinic in 3 months. Procedure Code(s):        --- Professional ---  45385, Colonoscopy, flexible; with removal of                            tumor(s), polyp(s), or other lesion(s) by snare                            technique Diagnosis Code(s):        --- Professional ---                           D12.0, Benign neoplasm of cecum                           D12.2, Benign neoplasm of ascending colon                           K59.00, Constipation, unspecified                           R93.3, Abnormal findings on diagnostic imaging of                            other parts of digestive tract CPT copyright 2022 American Medical Association. All rights reserved. The codes documented in this report are preliminary and upon coder review may  be revised to meet current compliance requirements. Elon Alas. Abbey Chatters, DO Arthur Abbey Chatters, DO 02/01/2022 10:39:14 AM This report has been signed electronically. Number of Addenda: 0

## 2022-02-01 NOTE — Interval H&P Note (Signed)
History and Physical Interval Note:  02/01/2022 9:58 AM  Amanda Zamora  has presented today for surgery, with the diagnosis of abnormal colon on CT,constipation.  The various methods of treatment have been discussed with the patient and family. After consideration of risks, benefits and other options for treatment, the patient has consented to  Procedure(s) with comments: COLONOSCOPY WITH PROPOFOL (N/A) - 10:00 am as a surgical intervention.  The patient's history has been reviewed, patient examined, no change in status, stable for surgery.  I have reviewed the patient's chart and labs.  Questions were answered to the patient's satisfaction.     Eloise Harman

## 2022-02-01 NOTE — Transfer of Care (Signed)
Immediate Anesthesia Transfer of Care Note  Patient: Amanda Zamora  Procedure(s) Performed: COLONOSCOPY WITH PROPOFOL POLYPECTOMY  Patient Location: Endoscopy Unit  Anesthesia Type:General  Level of Consciousness: awake  Airway & Oxygen Therapy: Patient Spontanous Breathing  Post-op Assessment: Report given to RN and Post -op Vital signs reviewed and stable  Post vital signs: Reviewed and stable  Last Vitals:  Vitals Value Taken Time  BP 108/62 02/01/22 1039  Temp 36.3 C 02/01/22 1036  Pulse 72   Resp 16   SpO2 95 % 02/01/22 1036    Last Pain:  Vitals:   02/01/22 1039  TempSrc:   PainSc: 0-No pain      Patients Stated Pain Goal: 6 (58/59/29 2446)  Complications: No notable events documented.

## 2022-02-01 NOTE — Discharge Instructions (Addendum)
Su colonoscopia revel 3 plipos que elimin. Recomiendo repetir la colonoscopia en 3 aos. Seguimiento en clnica gastrointestinal en 3 a 4 meses.  Dr. Abbey Chatters    Message sent to the office to schedule a follow up appointment

## 2022-02-01 NOTE — Anesthesia Preprocedure Evaluation (Signed)
Anesthesia Evaluation  Patient identified by MRN, date of birth, ID band Patient awake    Reviewed: Allergy & Precautions, H&P , NPO status , Patient's Chart, lab work & pertinent test results, reviewed documented beta blocker date and time   History of Anesthesia Complications (+) PONV and history of anesthetic complications  Airway Mallampati: II  TM Distance: >3 FB Neck ROM: full    Dental no notable dental hx.    Pulmonary neg pulmonary ROS   Pulmonary exam normal breath sounds clear to auscultation       Cardiovascular Exercise Tolerance: Good hypertension,  Rhythm:regular Rate:Normal     Neuro/Psych negative neurological ROS  negative psych ROS   GI/Hepatic Neg liver ROS,GERD  Medicated,,  Endo/Other  negative endocrine ROSdiabetes    Renal/GU negative Renal ROS  negative genitourinary   Musculoskeletal   Abdominal   Peds  Hematology negative hematology ROS (+)   Anesthesia Other Findings   Reproductive/Obstetrics negative OB ROS                             Anesthesia Physical Anesthesia Plan  ASA: 2  Anesthesia Plan: General   Post-op Pain Management:    Induction:   PONV Risk Score and Plan: Propofol infusion  Airway Management Planned:   Additional Equipment:   Intra-op Plan:   Post-operative Plan:   Informed Consent: I have reviewed the patients History and Physical, chart, labs and discussed the procedure including the risks, benefits and alternatives for the proposed anesthesia with the patient or authorized representative who has indicated his/her understanding and acceptance.     Dental Advisory Given  Plan Discussed with: CRNA  Anesthesia Plan Comments:        Anesthesia Quick Evaluation

## 2022-02-02 LAB — SURGICAL PATHOLOGY

## 2022-02-03 NOTE — Anesthesia Postprocedure Evaluation (Signed)
Anesthesia Post Note  Patient: Amanda Zamora  Procedure(s) Performed: COLONOSCOPY WITH PROPOFOL POLYPECTOMY  Patient location during evaluation: Phase II Anesthesia Type: General Level of consciousness: awake Pain management: pain level controlled Vital Signs Assessment: post-procedure vital signs reviewed and stable Respiratory status: spontaneous breathing and respiratory function stable Cardiovascular status: blood pressure returned to baseline and stable Postop Assessment: no headache and no apparent nausea or vomiting Anesthetic complications: no Comments: Late entry   No notable events documented.   Last Vitals:  Vitals:   02/01/22 1039 02/01/22 1041  BP: (!) 93/55 108/62  Pulse:  72  Resp:    Temp:    SpO2: 96% 96%    Last Pain:  Vitals:   02/01/22 1054  TempSrc:   PainSc: [place]

## 2022-02-05 DIAGNOSIS — Z79899 Other long term (current) drug therapy: Secondary | ICD-10-CM | POA: Insufficient documentation

## 2022-02-05 DIAGNOSIS — Z20822 Contact with and (suspected) exposure to covid-19: Secondary | ICD-10-CM | POA: Insufficient documentation

## 2022-02-05 DIAGNOSIS — Z794 Long term (current) use of insulin: Secondary | ICD-10-CM | POA: Insufficient documentation

## 2022-02-05 DIAGNOSIS — E119 Type 2 diabetes mellitus without complications: Secondary | ICD-10-CM | POA: Insufficient documentation

## 2022-02-05 DIAGNOSIS — I10 Essential (primary) hypertension: Secondary | ICD-10-CM | POA: Insufficient documentation

## 2022-02-05 DIAGNOSIS — R410 Disorientation, unspecified: Secondary | ICD-10-CM | POA: Insufficient documentation

## 2022-02-05 DIAGNOSIS — Z7984 Long term (current) use of oral hypoglycemic drugs: Secondary | ICD-10-CM | POA: Insufficient documentation

## 2022-02-05 DIAGNOSIS — G454 Transient global amnesia: Secondary | ICD-10-CM | POA: Insufficient documentation

## 2022-02-05 NOTE — ED Triage Notes (Signed)
Pt arrived from home via POV c/o altered mental status. Pts daughter present in Triage reporting Pt made multiple phone calls to her this evening reporting she didn't know where she was. Pt reports not remembering making phone calls to her daughter. Pt currently disoriented to situation.

## 2022-02-06 ENCOUNTER — Emergency Department (HOSPITAL_COMMUNITY)
Admission: EM | Admit: 2022-02-06 | Discharge: 2022-02-06 | Disposition: A | Discharge status: Home / Self Care | Payer: Self-pay | Attending: Emergency Medicine | Admitting: Emergency Medicine

## 2022-02-06 ENCOUNTER — Encounter (HOSPITAL_COMMUNITY): Payer: Self-pay

## 2022-02-06 ENCOUNTER — Other Ambulatory Visit: Payer: Self-pay

## 2022-02-06 ENCOUNTER — Emergency Department (HOSPITAL_COMMUNITY): Payer: Self-pay

## 2022-02-06 DIAGNOSIS — G454 Transient global amnesia: Secondary | ICD-10-CM

## 2022-02-06 LAB — URINALYSIS, ROUTINE W REFLEX MICROSCOPIC
Bilirubin Urine: NEGATIVE
Glucose, UA: NEGATIVE mg/dL
Ketones, ur: NEGATIVE mg/dL
Leukocytes,Ua: NEGATIVE
Nitrite: NEGATIVE
Protein, ur: NEGATIVE mg/dL
Specific Gravity, Urine: 1.004 — ABNORMAL LOW (ref 1.005–1.030)
pH: 8 (ref 5.0–8.0)

## 2022-02-06 LAB — CBC
HCT: 36.9 % (ref 36.0–46.0)
Hemoglobin: 12.1 g/dL (ref 12.0–15.0)
MCH: 28.9 pg (ref 26.0–34.0)
MCHC: 32.8 g/dL (ref 30.0–36.0)
MCV: 88.3 fL (ref 80.0–100.0)
Platelets: 267 10*3/uL (ref 150–400)
RBC: 4.18 MIL/uL (ref 3.87–5.11)
RDW: 14.4 % (ref 11.5–15.5)
WBC: 11 10*3/uL — ABNORMAL HIGH (ref 4.0–10.5)
nRBC: 0 % (ref 0.0–0.2)

## 2022-02-06 LAB — COMPREHENSIVE METABOLIC PANEL
ALT: 22 U/L (ref 0–44)
AST: 27 U/L (ref 15–41)
Albumin: 4.2 g/dL (ref 3.5–5.0)
Alkaline Phosphatase: 85 U/L (ref 38–126)
Anion gap: 10 (ref 5–15)
BUN: 19 mg/dL (ref 6–20)
CO2: 28 mmol/L (ref 22–32)
Calcium: 9.6 mg/dL (ref 8.9–10.3)
Chloride: 102 mmol/L (ref 98–111)
Creatinine, Ser: 1.69 mg/dL — ABNORMAL HIGH (ref 0.44–1.00)
GFR, Estimated: 34 mL/min — ABNORMAL LOW (ref >60)
Glucose, Bld: 155 mg/dL — ABNORMAL HIGH (ref 70–99)
Potassium: 4.3 mmol/L (ref 3.5–5.1)
Sodium: 140 mmol/L (ref 135–145)
Total Bilirubin: 0.9 mg/dL (ref 0.3–1.2)
Total Protein: 8 g/dL (ref 6.5–8.1)

## 2022-02-06 LAB — PROTIME-INR
INR: 0.9 (ref 0.8–1.2)
Prothrombin Time: 12.4 seconds (ref 11.4–15.2)

## 2022-02-06 LAB — RESP PANEL BY RT-PCR (RSV, FLU A&B, COVID)  RVPGX2
Influenza A by PCR: NEGATIVE
Influenza B by PCR: NEGATIVE
Resp Syncytial Virus by PCR: NEGATIVE
SARS Coronavirus 2 by RT PCR: NEGATIVE

## 2022-02-06 LAB — CBG MONITORING, ED: Glucose-Capillary: 120 mg/dL — ABNORMAL HIGH (ref 70–99)

## 2022-02-06 MED ORDER — SODIUM CHLORIDE 0.9 % IV BOLUS
1000.0000 mL | Freq: Once | INTRAVENOUS | Status: AC
  Administered 2022-02-06: 1000 mL via INTRAVENOUS

## 2022-02-06 NOTE — ED Provider Notes (Signed)
[facility] EMERGENCY DEPARTMENT Provider Note   CSN: [id number] Arrival date & time: 02/05/22  2324     History  Chief Complaint  Patient presents with   Altered Mental Status    Lilymae Swiech is a 61 y.o. female.  HPI EXTR-year-old female presents here to [employer] from [facility].  She was seen and evaluated by Dr. Modena Nunnery last night.  She had episode of confusion.  This has completely resolved.  She was worked up there with labs and CT scan.  Neurology consult was obtained.  She was noted to have some mild kidney injury.  Neurology consultation advised MRI.    Home Medications Prior to Admission medications   Medication Sig Start Date End Date Taking? Authorizing Provider  amLODipine (NORVASC) 10 MG tablet TAKE 1 Tablet BY MOUTH ONCE EVERY DAY 02/01/22   Soyla Dryer, PA-C  atorvastatin (LIPITOR) 80 MG tablet Tome una tableta por boca al dormir 09/28/21   Soyla Dryer, PA-C  cyclobenzaprine (FLEXERIL) 10 MG tablet TAKE 1 TABLET BY MOUTH TWICE DAILY AS NEEDED FOR MUSCLE SPASM 12/22/21   Soyla Dryer, PA-C  gabapentin (NEURONTIN) 300 MG capsule Take 1 capsule (300 mg total) by mouth at bedtime. 01/19/22   Soyla Dryer, PA-C  insulin NPH-regular Human (NOVOLIN 70/30 RELION) (70-30) 100 UNIT/ML injection 20 units with breakfast and 20 units with supper if glucose is above 90 and she is eating. 12/22/21   Brita Romp, NP  losartan (COZAAR) 100 MG tablet Take 1 tablet (100 mg total) by mouth daily. 09/28/21   Soyla Dryer, PA-C  meclizine (ANTIVERT) 25 MG tablet Take 1 tablet (25 mg total) by mouth 3 (three) times daily as needed for dizziness. 01/19/22   Soyla Dryer, PA-C  metFORMIN (GLUCOPHAGE) 1000 MG tablet TAKE 1 TABLET BY MOUTH TWICE DAILY WITH A MEAL.   Tome una tableta por [place] veces diarias con comida 01/19/22   Soyla Dryer, PA-C  multivitamin-iron-minerals-folic acid (CENTRUM) chewable tablet Chew 1 tablet by mouth daily.     [provider]  Omega-3 Fatty Acids (FISH OIL) 1000 MG CAPS Take 1 capsule (1,000 mg total) by mouth in the morning and at bedtime. Patient taking differently: Take 1 capsule by mouth at bedtime. 04/01/20   Barton Dubois, MD  omeprazole (PRILOSEC) 20 MG capsule TAKE 1 Capsule BY MOUTH ONCE EVERY DAY 02/01/22   Soyla Dryer, PA-C  ondansetron (ZOFRAN-ODT) 4 MG disintegrating tablet Take 1 tablet (4 mg total) by mouth every 8 (eight) hours as needed for up to 15 doses for nausea or vomiting. 01/02/22   Wyvonnia Dusky, MD      Allergies    Patient has no known allergies.    Review of Systems   Review of Systems  Physical Exam Updated Vital Signs BP (!) 124/57 (BP Location: Left Arm)   Pulse 73   Temp 98.4 F (36.9 C) (Oral)   Resp 15   Ht 1.575 m ('5\' 2"'$ )   Wt 71.2 kg   LMP 02/15/2011 (Approximate)   SpO2 99%   BMI 28.71 kg/m  Physical Exam Vitals and nursing note reviewed.  Constitutional:      General: She is not in acute distress.    Appearance: She is well-developed.  HENT:     Head: Normocephalic and atraumatic.     Right Ear: External ear normal.     Left Ear: External ear normal.     Nose: Nose normal.  Eyes:  Conjunctiva/sclera: Conjunctivae normal.     Pupils: Pupils are equal, round, and reactive to light.  Pulmonary:     Effort: Pulmonary effort is normal.  Musculoskeletal:        General: Normal range of motion.     Cervical back: Normal range of motion and neck supple.  Skin:    General: Skin is warm and dry.  Neurological:     General: No focal deficit present.     Mental Status: She is alert and oriented to person, place, and time.     Motor: No abnormal muscle tone.     Coordination: Coordination normal.  Psychiatric:        Behavior: Behavior normal.        Thought Content: Thought content normal.     ED Results / Procedures / Treatments   Labs (all labs ordered are listed, but only abnormal results are displayed) Labs  Reviewed  CBC - Abnormal; Notable for the following components:      Result Value   WBC 11.0 (*)    All other components within normal limits  COMPREHENSIVE METABOLIC PANEL - Abnormal; Notable for the following components:   Glucose, Bld 155 (*)    Creatinine, Ser 1.69 (*)    GFR, Estimated 34 (*)    All other components within normal limits  URINALYSIS, ROUTINE W REFLEX MICROSCOPIC - Abnormal; Notable for the following components:   Color, Urine STRAW (*)    Specific Gravity, Urine 1.004 (*)    Hgb urine dipstick SMALL (*)    Bacteria, UA RARE (*)    All other components within normal limits  CBG MONITORING, ED - Abnormal; Notable for the following components:   Glucose-Capillary 120 (*)    All other components within normal limits  RESP PANEL BY RT-PCR (RSV, FLU A&B, COVID)  [id number]  PROTIME-INR    EKG None  Radiology MR BRAIN WO CONTRAST  Result Date: 02/06/2022 CLINICAL DATA:  Neuro deficit with stroke suspected. History of delirium by prior head CT indication EXAM: MRI HEAD WITHOUT CONTRAST TECHNIQUE: Multiplanar, multiecho pulse sequences of the brain and surrounding structures were obtained without intravenous contrast. COMPARISON:  Head CT from earlier today FINDINGS: Brain: No acute infarction, hemorrhage, hydrocephalus, extra-axial collection or mass lesion. Remote periventricular white matter insult at the frontal horn of the right lateral ventricle, nonspecific in isolation, likely ischemic given age and medical history. Vascular: Normal flow voids. Skull and upper cervical spine: Normal marrow signal. Sinuses/Orbits: Negative. IMPRESSION: No acute finding. Electronically Signed   By: Jorje Guild M.D.   On: 02/06/2022 08:28   CT HEAD WO CONTRAST (5MM)  Result Date: 02/06/2022 CLINICAL DATA:  Delirium EXAM: CT HEAD WITHOUT CONTRAST TECHNIQUE: Contiguous axial images were obtained from the base of the skull through the vertex without intravenous contrast. RADIATION  DOSE REDUCTION: This exam was performed according to the departmental dose-optimization program which includes automated exposure control, adjustment of the mA and/or kV according to patient size and/or use of iterative reconstruction technique. COMPARISON:  None Available. FINDINGS: Brain: Normal anatomic configuration. No abnormal intra or extra-axial mass lesion or fluid collection. No abnormal mass effect or midline shift. No evidence of acute intracranial hemorrhage or infarct. Ventricular size is normal. Cerebellum unremarkable. Vascular: Unremarkable Skull: Intact Sinuses/Orbits: Paranasal sinuses are clear. Orbits are unremarkable. Other: Mastoid air cells and middle ear cavities are clear. IMPRESSION: 1. No acute intracranial abnormality. Electronically Signed   By: Fidela Salisbury M.D.   On: 02/06/2022 00:47  Procedures Procedures    Medications Ordered in ED Medications  sodium chloride 0.9 % bolus 1,000 mL (0 mLs Intravenous Stopped 02/06/22 0427)    ED Course/ Medical Decision Making/ A&P                           Medical Decision Making Patient with episode last night consistent with episode of confusion who has returned to baseline.  Workup thought to be most consistent with  Episode most consistent with transient global amnesia Patient has remained back to baseline and stable. MRI was obtained and shows no evidence of acute infarct or other acute abnormalities There is a remote periventricular white matter insult to horn of the right lateral ventricle which was likely ischemic in nature. Patient and daughter advised Advised regarding management of risk factors which in her case includes type 2 diabetes, hyperlipidemia, hypertension, and obesity Patient advised to return if there are any worsening or return of symptoms and to follow-up with her primary care doctor within the next week    Amount and/or Complexity of Data Reviewed Labs: ordered. Decision-making details  documented in ED Course. Radiology: ordered and independent interpretation performed. Decision-making details documented in ED Course. ECG/medicine tests: ordered.           Final Clinical Impression(s) / ED Diagnoses Final diagnoses:  Transient global amnesia    Rx / DC Orders ED Discharge Orders     None         Pattricia Boss, MD 02/06/22 1023

## 2022-02-06 NOTE — Discharge Instructions (Addendum)
Your symptoms are concerning for an episode of transient global amnesia.  We discussed her case with the neurologist, they recommend transfer to [facility] for MRI. Your MRI shows no evidence of acute stroke or other new abnormality.  However there is an area that looks like an old stroke.  You have multiple risk factors for strokes including high blood pressure, high cholesterol, diabetes.  Please make sure that these are followed closely and managed well by your doctor. If you have any return of symptoms or worsening symptoms please return here to the emergency department for reevaluation.

## 2022-02-06 NOTE — ED Notes (Signed)
RN called MRI to notify patient if in waiting room ready for MRI.

## 2022-02-06 NOTE — ED Provider Notes (Signed)
[facility] Emergency Department Provider Note MRN:  [id number]  Arrival date & time: 02/06/22     Chief Complaint   Altered Mental Status   History of Present Illness   Amanda Zamora is a 61 y.o. year-old female with a history of diabetes presenting to the ED with chief complaint of altered mental status.  Earlier today patient frantically began calling multiple family members asking repetitive questions of where are they, are they okay, where is, she, where does she live.  She sounded very frantic on the phone per family.  It seemed that she was very confused and forgot a lot of her short-term memory.  On the way here she seemed to calm down.  She seems to be back to normal but she still does not remember much about how she called multiple family members, does not recall the episode very well.  No other symptoms or complaints.  Review of Systems  A thorough review of systems was obtained and all systems are negative except as noted in the HPI and PMH.   Patient's Health History    Past Medical History:  Diagnosis Date   Detached retina    GERD (gastroesophageal reflux disease)    History of pyelonephritis    PONV (postoperative nausea and vomiting)    Type 2 diabetes mellitus ([place])     Past Surgical History:  Procedure Laterality Date   CHOLECYSTECTOMY N/A 12/23/2019   Procedure: LAPAROSCOPIC CHOLECYSTECTOMY;  Surgeon: Virl Cagey, MD;  Location: [facility];  Service: General;  Laterality: N/A;   DENTAL SURGERY  06/01/2021   Removed 5 teeth   EYE SURGERY Right    retina   LAPAROSCOPIC APPENDECTOMY N/A 07/05/2020   Procedure: APPENDECTOMY LAPAROSCOPIC;  Surgeon: Aviva Signs, MD;  Location: [facility];  Service: General;  Laterality: N/A;   TUBAL LIGATION      Family History  Problem Relation Age of Onset   Breast cancer Sister    Diabetes Sister    Colonic polyp Neg Hx     Social History   Socioeconomic History   Marital status: Single     Spouse name: Not on file   Number of children: Not on file   Years of education: Not on file   Highest education level: Not on file  Occupational History   Not on file  Tobacco Use   Smoking status: Never   Smokeless tobacco: Never  Vaping Use   Vaping Use: Never used  Substance and Sexual Activity   Alcohol use: Not Currently    Comment: previously would drink occ. beer last drink 06/2017   Drug use: No   Sexual activity: Yes    Birth control/protection: None  Other Topics Concern   Not on file  Social History Narrative   Not on file   Social Determinants of Health   Financial Resource Strain: Not on file  Food Insecurity: Not on file  Transportation Needs: Not on file  Physical Activity: Not on file  Stress: Not on file  Social Connections: Not on file  Intimate Partner Violence: Not on file     Physical Exam   Vitals:   02/06/22 0001 02/06/22 0223  BP: (!) 160/81 136/69  Pulse: 85 77  Resp: 16 15  Temp: 98.4 F (36.9 C)   SpO2: 99% 98%    CONSTITUTIONAL: Well-appearing, NAD NEURO/PSYCH:  Alert and oriented x 3, normal and symmetric strength and sensation, normal coordination, normal speech EYES:  eyes equal and reactive  ENT/NECK:  no LAD, no JVD CARDIO: Regular rate, well-perfused, normal S1 and S2 PULM:  CTAB no wheezing or rhonchi GI/GU:  non-distended, non-tender MSK/SPINE:  No gross deformities, no edema SKIN:  no rash, atraumatic   *Additional and/or pertinent findings included in MDM below  Diagnostic and Interventional Summary    EKG Interpretation  Date/Time:    Ventricular Rate:    PR Interval:    QRS Duration:   QT Interval:    QTC Calculation:   R Axis:     Text Interpretation:         Labs Reviewed  CBC - Abnormal; Notable for the following components:      Result Value   WBC 11.0 (*)    All other components within normal limits  COMPREHENSIVE METABOLIC PANEL - Abnormal; Notable for the following components:   Glucose, Bld  155 (*)    Creatinine, Ser 1.69 (*)    GFR, Estimated 34 (*)    All other components within normal limits  URINALYSIS, ROUTINE W REFLEX MICROSCOPIC - Abnormal; Notable for the following components:   Color, Urine STRAW (*)    Specific Gravity, Urine 1.004 (*)    Hgb urine dipstick SMALL (*)    Bacteria, UA RARE (*)    All other components within normal limits  CBG MONITORING, ED - Abnormal; Notable for the following components:   Glucose-Capillary 120 (*)    All other components within normal limits  RESP PANEL BY RT-PCR (RSV, FLU A&B, COVID)  RVPGX2  PROTIME-INR    CT HEAD WO CONTRAST (5MM)  Final Result    MR BRAIN WO CONTRAST    (Results Pending)    Medications  sodium chloride 0.9 % bolus 1,000 mL (1,000 mLs Intravenous New Bag/Given 02/06/22 7741)     Procedures  /  Critical Care Procedures  ED Course and Medical Decision Making  Initial Impression and Ddx History is suspicious for transient global amnesia.  Patient has a completely normal neurological exam at this time.  She is demonstrating a mild AKI but otherwise reassuring workup here in the emergency department, normal CT head, no electrolyte disturbance, negative for COVID flu and RSV.  No UTI.  Has also been having some vertigo for the past few weeks and so stroke is considered.  Past medical/surgical history that increases complexity of ED encounter: Diabetes  Interpretation of Diagnostics I personally reviewed the EKG and my interpretation is as follows: Sinus rhythm    Patient Reassessment and Ultimate Disposition/Management     Case discussed with Dr. Leonel Ramsay of neurology.  Recommendation is MRI.  The MRI is ordered and patient will travel by private vehicle to [facility], her daughter will drive.  They are made aware of the potential long wait, how she may have to wait in the waiting room for her MRI.  Per neurology, if MRI is normal patient would be appropriate for discharge.  Patient management  required discussion with the following services or consulting groups:  Neurology  Complexity of Problems Addressed Acute illness or injury that poses threat of life of bodily function  Additional Data Reviewed and Analyzed Further history obtained from: Further history from spouse/family member  Additional Factors Impacting ED Encounter Risk Consideration of hospitalization  Barth Kirks. [place] Small, [place] [email or web address]  Final Clinical Impressions(s) / ED Diagnoses     ICD-10-CM   1. Transient global amnesia  G45.4       ED Discharge Orders  None        Discharge Instructions Discussed with and Provided to Patient:    Discharge Instructions      Your symptoms are concerning for an episode of transient global amnesia.  We discussed her case with the neurologist, they recommend transfer to [facility] for MRI.      Maudie Flakes, MD 02/06/22 [phone]

## 2022-02-06 NOTE — ED Provider Notes (Signed)
[place] EMERGENCY DEPARTMENT Provider Note   CSN: [id number] Arrival date & time: 02/05/22  2324     History  Chief Complaint  Patient presents with   Altered Mental Status    Amanda Zamora is a 61 y.o. female.  HPI   61 year old female presents today via [facility] from [facility].  Patient was seen and evaluated by Dr. Darrick Grinder in the ED with a chief complaint of altered mental status.  Per his note patient had been calling family members and asking repetitive questions.  She appeared to be very confused and had appeared to have short-term memory loss.  She appeared to be back to baseline when in the ED.  Labs and consult to neurology obtained.  She had a mild AKI but otherwise reassuring workup in the [facility] emergency department with a normal CT of her head negative for COVID and flu symptoms no UTI Care was discussed with Dr. Leonel Ramsay of neurology who recommended MRI.  Patient transferred to at [facility] for MRI and her daughter was driving her here.  Plan was for discharge if MRI is negative  Home Medications Prior to Admission medications   Medication Sig Start Date End Date Taking? Authorizing Provider  amLODipine (NORVASC) 10 MG tablet TAKE 1 Tablet BY MOUTH ONCE EVERY DAY 02/01/22   Soyla Dryer, PA-C  atorvastatin (LIPITOR) 80 MG tablet Tome una tableta por boca al dormir 09/28/21   Soyla Dryer, PA-C  cyclobenzaprine (FLEXERIL) 10 MG tablet TAKE 1 TABLET BY MOUTH TWICE DAILY AS NEEDED FOR MUSCLE SPASM 12/22/21   Soyla Dryer, PA-C  gabapentin (NEURONTIN) 300 MG capsule Take 1 capsule (300 mg total) by mouth at bedtime. 01/19/22   Soyla Dryer, PA-C  insulin NPH-regular Human (NOVOLIN 70/30 RELION) (70-30) 100 UNIT/ML injection 20 units with breakfast and 20 units with supper if glucose is above 90 and she is eating. 12/22/21   Brita Romp, NP  losartan (COZAAR) 100 MG tablet Take 1 tablet (100 mg total) by mouth  daily. 09/28/21   Soyla Dryer, PA-C  meclizine (ANTIVERT) 25 MG tablet Take 1 tablet (25 mg total) by mouth 3 (three) times daily as needed for dizziness. 01/19/22   Soyla Dryer, PA-C  metFORMIN (GLUCOPHAGE) 1000 MG tablet TAKE 1 TABLET BY MOUTH TWICE DAILY WITH A MEAL.   Tome una tableta por [place] veces diarias con comida 01/19/22   Soyla Dryer, PA-C  multivitamin-iron-minerals-folic acid (CENTRUM) chewable tablet Chew 1 tablet by mouth daily.    [provider]  Omega-3 Fatty Acids (FISH OIL) 1000 MG CAPS Take 1 capsule (1,000 mg total) by mouth in the morning and at bedtime. Patient taking differently: Take 1 capsule by mouth at bedtime. 04/01/20   Barton Dubois, MD  omeprazole (PRILOSEC) 20 MG capsule TAKE 1 Capsule BY MOUTH ONCE EVERY DAY 02/01/22   Soyla Dryer, PA-C  ondansetron (ZOFRAN-ODT) 4 MG disintegrating tablet Take 1 tablet (4 mg total) by mouth every 8 (eight) hours as needed for up to 15 doses for nausea or vomiting. 01/02/22   Wyvonnia Dusky, MD      Allergies    Patient has no known allergies.    Review of Systems   Review of Systems  Physical Exam Updated Vital Signs BP 128/67 (BP Location: Left Arm)   Pulse 70   Temp 98.2 F (36.8 C) (Oral)   Resp 16   Ht 1.575 m ('5\' 2"'$ )   Wt 71.2 kg   LMP  02/15/2011 (Approximate)   SpO2 99%   BMI 28.71 kg/m  Physical Exam  ED Results / Procedures / Treatments   Labs (all labs ordered are listed, but only abnormal results are displayed) Labs Reviewed  CBC - Abnormal; Notable for the following components:      Result Value   WBC 11.0 (*)    All other components within normal limits  COMPREHENSIVE METABOLIC PANEL - Abnormal; Notable for the following components:   Glucose, Bld 155 (*)    Creatinine, Ser 1.69 (*)    GFR, Estimated 34 (*)    All other components within normal limits  URINALYSIS, ROUTINE W REFLEX MICROSCOPIC - Abnormal; Notable for the following components:   Color, Urine  STRAW (*)    Specific Gravity, Urine 1.004 (*)    Hgb urine dipstick SMALL (*)    Bacteria, UA RARE (*)    All other components within normal limits  CBG MONITORING, ED - Abnormal; Notable for the following components:   Glucose-Capillary 120 (*)    All other components within normal limits  RESP PANEL BY RT-PCR (RSV, FLU A&B, COVID)  RVPGX2  PROTIME-INR    EKG None  Radiology MR BRAIN WO CONTRAST  Result Date: 02/06/2022 CLINICAL DATA:  Neuro deficit with stroke suspected. History of delirium by prior head CT indication EXAM: MRI HEAD WITHOUT CONTRAST TECHNIQUE: Multiplanar, multiecho pulse sequences of the brain and surrounding structures were obtained without intravenous contrast. COMPARISON:  Head CT from earlier today FINDINGS: Brain: No acute infarction, hemorrhage, hydrocephalus, extra-axial collection or mass lesion. Remote periventricular white matter insult at the frontal horn of the right lateral ventricle, nonspecific in isolation, likely ischemic given age and medical history. Vascular: Normal flow voids. Skull and upper cervical spine: Normal marrow signal. Sinuses/Orbits: Negative. IMPRESSION: No acute finding. Electronically Signed   By: Jorje Guild M.D.   On: 02/06/2022 08:28   CT HEAD WO CONTRAST (5MM)  Result Date: 02/06/2022 CLINICAL DATA:  Delirium EXAM: CT HEAD WITHOUT CONTRAST TECHNIQUE: Contiguous axial images were obtained from the base of the skull through the vertex without intravenous contrast. RADIATION DOSE REDUCTION: This exam was performed according to the departmental dose-optimization program which includes automated exposure control, adjustment of the mA and/or kV according to patient size and/or use of iterative reconstruction technique. COMPARISON:  None Available. FINDINGS: Brain: Normal anatomic configuration. No abnormal intra or extra-axial mass lesion or fluid collection. No abnormal mass effect or midline shift. No evidence of acute intracranial  hemorrhage or infarct. Ventricular size is normal. Cerebellum unremarkable. Vascular: Unremarkable Skull: Intact Sinuses/Orbits: Paranasal sinuses are clear. Orbits are unremarkable. Other: Mastoid air cells and middle ear cavities are clear. IMPRESSION: 1. No acute intracranial abnormality. Electronically Signed   By: Fidela Salisbury M.D.   On: 02/06/2022 00:47    Procedures Procedures    Medications Ordered in ED Medications  sodium chloride 0.9 % bolus 1,000 mL (0 mLs Intravenous Stopped 02/06/22 0427)    ED Course/ Medical Decision Making/ A&P                           Medical Decision Making Amount and/or Complexity of Data Reviewed Labs: ordered. Radiology: ordered. ECG/medicine tests: ordered.           Final Clinical Impression(s) / ED Diagnoses Final diagnoses:  Transient global amnesia    Rx / DC Orders ED Discharge Orders     None  Pattricia Boss, MD 02/06/22 [phone]

## 2022-02-09 ENCOUNTER — Encounter (HOSPITAL_COMMUNITY): Payer: Self-pay | Admitting: Internal Medicine

## 2022-02-16 NOTE — Progress Notes (Signed)
error 

## 2022-02-21 ENCOUNTER — Other Ambulatory Visit: Payer: Self-pay | Admitting: Physician Assistant

## 2022-03-16 ENCOUNTER — Other Ambulatory Visit: Payer: Self-pay | Admitting: Physician Assistant

## 2022-03-16 DIAGNOSIS — E785 Hyperlipidemia, unspecified: Secondary | ICD-10-CM

## 2022-03-16 DIAGNOSIS — I10 Essential (primary) hypertension: Secondary | ICD-10-CM

## 2022-03-16 DIAGNOSIS — E1165 Type 2 diabetes mellitus with hyperglycemia: Secondary | ICD-10-CM

## 2022-03-28 ENCOUNTER — Other Ambulatory Visit (HOSPITAL_COMMUNITY)
Admission: RE | Admit: 2022-03-28 | Discharge: 2022-03-28 | Disposition: A | Discharge status: Home / Self Care | Payer: Self-pay | Source: Ambulatory Visit | Attending: Physician Assistant | Admitting: Physician Assistant

## 2022-03-28 DIAGNOSIS — I10 Essential (primary) hypertension: Secondary | ICD-10-CM | POA: Insufficient documentation

## 2022-03-28 DIAGNOSIS — E1165 Type 2 diabetes mellitus with hyperglycemia: Secondary | ICD-10-CM | POA: Insufficient documentation

## 2022-03-28 DIAGNOSIS — E785 Hyperlipidemia, unspecified: Secondary | ICD-10-CM | POA: Insufficient documentation

## 2022-03-28 LAB — COMPREHENSIVE METABOLIC PANEL WITH GFR
ALT: 25 U/L (ref 0–44)
AST: 25 U/L (ref 15–41)
Albumin: 4 g/dL (ref 3.5–5.0)
Alkaline Phosphatase: 66 U/L (ref 38–126)
Anion gap: 8 (ref 5–15)
BUN: 15 mg/dL (ref 6–20)
CO2: 26 mmol/L (ref 22–32)
Calcium: 8.8 mg/dL — ABNORMAL LOW (ref 8.9–10.3)
Chloride: 104 mmol/L (ref 98–111)
Creatinine, Ser: 0.85 mg/dL (ref 0.44–1.00)
GFR, Estimated: >60 mL/min (ref >60)
Glucose, Bld: 149 mg/dL — ABNORMAL HIGH (ref 70–99)
Potassium: 4.3 mmol/L (ref 3.5–5.1)
Sodium: 138 mmol/L (ref 135–145)
Total Bilirubin: 0.8 mg/dL (ref 0.3–1.2)
Total Protein: 7.7 g/dL (ref 6.5–8.1)

## 2022-03-28 LAB — LIPID PANEL
Cholesterol: 107 mg/dL (ref 0–200)
HDL: 38 mg/dL — ABNORMAL LOW (ref >40)
LDL Cholesterol: 49 mg/dL (ref 0–99)
Total CHOL/HDL Ratio: 2.8 RATIO
Triglycerides: 99 mg/dL (ref <150)
VLDL: 20 mg/dL (ref 0–40)

## 2022-03-28 LAB — HEMOGLOBIN A1C
Hgb A1c MFr Bld: 6.9 % — ABNORMAL HIGH (ref 4.8–5.6)
Mean Plasma Glucose: 151.33 mg/dL

## 2022-03-29 ENCOUNTER — Encounter: Payer: Self-pay | Admitting: Physician Assistant

## 2022-03-29 ENCOUNTER — Ambulatory Visit: Payer: Self-pay | Admitting: Physician Assistant

## 2022-03-29 VITALS — BP 126/70 | HR 68 | Temp 97.5°F | Ht 60.5 in | Wt 159.8 lb

## 2022-03-29 DIAGNOSIS — Z789 Other specified health status: Secondary | ICD-10-CM

## 2022-03-29 DIAGNOSIS — E11319 Type 2 diabetes mellitus with unspecified diabetic retinopathy without macular edema: Secondary | ICD-10-CM

## 2022-03-29 DIAGNOSIS — I10 Essential (primary) hypertension: Secondary | ICD-10-CM

## 2022-03-29 DIAGNOSIS — E785 Hyperlipidemia, unspecified: Secondary | ICD-10-CM

## 2022-03-29 DIAGNOSIS — E1165 Type 2 diabetes mellitus with hyperglycemia: Secondary | ICD-10-CM

## 2022-03-29 NOTE — Progress Notes (Signed)
BP 126/70   Pulse 68   Temp (!) 97.5 F (36.4 C)   Ht 5' 0.5" (1.537 m)   Wt 159 lb 12 oz (72.5 kg)   LMP 02/15/2011 (Approximate)   SpO2 100%   BMI 30.69 kg/m    Subjective:    Patient ID: Amanda Zamora, female    DOB: 1960/03/17, 62 y.o.   MRN: [id number]  HPI: Amanda Zamora is a 62 y.o. female presenting on 03/29/2022 for Diabetes, Hypertension, and Hyperlipidemia   HPI  Chief Complaint  Patient presents with   Diabetes   Hypertension   Hyperlipidemia    Pt c/o ear pain.   Hurting > a month.   She has appt with ENT in April.  16  On 02/06/22,  She went to ER for confusion she says it last for 4 hours.    It hasn't recurred.   ? Dehydration-  cr elevated to 1.6 that day.   She is still seeing the eye doctor- last [place] 10/25/21-    due for f/u in march  OD limited vision with B diabetic retinopathy and R retinal detachment  She says her BMs are good now.      Relevant past medical, surgical, family and social history reviewed and updated as indicated. Interim medical history since our last visit reviewed. Allergies and medications reviewed and updated.    Current Outpatient Medications:    amLODipine (NORVASC) 10 MG tablet, TAKE 1 Tablet BY MOUTH ONCE EVERY DAY, Disp: 90 tablet, Rfl: 1   atorvastatin (LIPITOR) 80 MG tablet, Tome una tableta por boca al dormir, Disp: 90 tablet, Rfl: 1   gabapentin (NEURONTIN) 300 MG capsule, Take 1 capsule by mouth at bedtime, Disp: 30 capsule, Rfl: 1   insulin NPH-regular Human (NOVOLIN 70/30 RELION) (70-30) 100 UNIT/ML injection, 20 units with breakfast and 20 units with supper if glucose is above 90 and she is eating., Disp: 30 mL, Rfl: 3   losartan (COZAAR) 100 MG tablet, Take 1 tablet (100 mg total) by mouth daily., Disp: 90 tablet, Rfl: 1   meclizine (ANTIVERT) 25 MG tablet, Take 1 tablet (25 mg total) by mouth 3 (three) times daily as needed for dizziness., Disp: 40 tablet, Rfl: 0   metFORMIN (GLUCOPHAGE) 1000 MG  tablet, TAKE 1 TABLET BY MOUTH TWICE DAILY WITH A MEAL.   Tome una tableta por boca dos veces diarias con comida, Disp: 180 tablet, Rfl: 1   multivitamin-iron-minerals-folic acid (CENTRUM) chewable tablet, Chew 1 tablet by mouth daily., Disp: , Rfl:    Omega-3 Fatty Acids (FISH OIL) 1000 MG CAPS, Take 1 capsule (1,000 mg total) by mouth in the morning and at bedtime. (Patient taking differently: Take 1 capsule by mouth at bedtime.), Disp: 60 capsule, Rfl: 2   omeprazole (PRILOSEC) 20 MG capsule, TAKE 1 Capsule BY MOUTH ONCE EVERY DAY, Disp: 90 capsule, Rfl: 1   cyclobenzaprine (FLEXERIL) 10 MG tablet, TAKE 1 TABLET BY MOUTH TWICE DAILY AS NEEDED FOR MUSCLE SPASM (Patient not taking: Reported on 03/29/2022), Disp: 20 tablet, Rfl: 0   ondansetron (ZOFRAN-ODT) 4 MG disintegrating tablet, Take 1 tablet (4 mg total) by mouth every 8 (eight) hours as needed for up to 15 doses for nausea or vomiting. (Patient not taking: Reported on 03/29/2022), Disp: 15 tablet, Rfl: 0   Review of Systems  Per HPI unless specifically indicated above     Objective:    BP 126/70   Pulse 68   Temp (!) 97.5 F (36.4 C)  Ht 5' 0.5" (1.537 m)   Wt 159 lb 12 oz (72.5 kg)   LMP 02/15/2011 (Approximate)   SpO2 100%   BMI 30.69 kg/m   Wt Readings from Last 3 Encounters:  03/29/22 159 lb 12 oz (72.5 kg)  02/06/22 156 lb 15.5 oz (71.2 kg)  02/01/22 157 lb (71.2 kg)    Physical Exam Vitals reviewed.  Constitutional:      General: She is not in acute distress.    Appearance: She is well-developed. She is not toxic-appearing.  HENT:     Head: Normocephalic and atraumatic.     Right Ear: Tympanic membrane, ear canal and external ear normal.     Left Ear: Tympanic membrane, ear canal and external ear normal.  Eyes:     Extraocular Movements: Extraocular movements intact.     Conjunctiva/sclera: Conjunctivae normal.     Pupils: Pupils are equal, round, and reactive to light.  Cardiovascular:     Rate and Rhythm:  Normal rate and regular rhythm.  Pulmonary:     Effort: Pulmonary effort is normal.     Breath sounds: Normal breath sounds.  Abdominal:     General: Bowel sounds are normal.     Palpations: Abdomen is soft. There is no mass.     Tenderness: There is no abdominal tenderness.  Musculoskeletal:     Cervical back: Neck supple.     Right lower leg: No edema.     Left lower leg: No edema.  Lymphadenopathy:     Cervical: No cervical adenopathy.  Skin:    General: Skin is warm and dry.  Neurological:     Mental Status: She is alert and oriented to person, place, and time.     Sensory: Sensation is intact.     Motor: No weakness or tremor.     Gait: Gait is intact.     Deep Tendon Reflexes:     Reflex Scores:      Patellar reflexes are 2+ on the right side and 2+ on the left side. Psychiatric:        Behavior: Behavior normal.     Results for orders placed or performed during the hospital encounter of 03/28/22  Comprehensive metabolic panel  Result Value Ref Range   Sodium 138 135 - 145 mmol/L   Potassium 4.3 3.5 - 5.1 mmol/L   Chloride 104 98 - 111 mmol/L   CO2 26 22 - 32 mmol/L   Glucose, Bld 149 (H) 70 - 99 mg/dL   BUN 15 6 - 20 mg/dL   Creatinine, Ser 0.85 0.44 - 1.00 mg/dL   Calcium 8.8 (L) 8.9 - 10.3 mg/dL   Total Protein 7.7 6.5 - 8.1 g/dL   Albumin 4.0 3.5 - 5.0 g/dL   AST 25 15 - 41 U/L   ALT 25 0 - 44 U/L   Alkaline Phosphatase 66 38 - 126 U/L   Total Bilirubin 0.8 0.3 - 1.2 mg/dL   GFR, Estimated >60 >60 mL/min   Anion gap 8 5 - 15  Lipid panel  Result Value Ref Range   Cholesterol 107 0 - 200 mg/dL   Triglycerides 99 <150 mg/dL   HDL 38 (L) >40 mg/dL   Total CHOL/HDL Ratio 2.8 RATIO   VLDL 20 0 - 40 mg/dL   LDL Cholesterol 49 0 - 99 mg/dL  Hemoglobin A1c  Result Value Ref Range   Hgb A1c MFr Bld 6.9 (H) 4.8 - 5.6 %   Mean Plasma Glucose 151.33  mg/dL      Assessment & Plan:    Encounter Diagnoses  Name Primary?   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  Discussed that this better control with help reduce further complications -will Check on handicapped placard requirements for a non-driver -refer again for screening mammogram -pt to continue with her eye doctor at [facility] per their recommendations -pt to see ENT as scheduled in April -discussed MRI head normal in December.  Exam unremarkable.  Discussed with pt that if she has further episodes of the confusion, further work-up would be warranted.  She is in agreement -pt to follow up here 3 months. She is to contact office sooner prn

## 2022-04-21 ENCOUNTER — Other Ambulatory Visit: Payer: Self-pay | Admitting: Physician Assistant

## 2022-04-26 ENCOUNTER — Ambulatory Visit: Payer: Self-pay | Admitting: Nutrition

## 2022-04-26 ENCOUNTER — Ambulatory Visit: Payer: Self-pay | Admitting: Nurse Practitioner

## 2022-05-02 NOTE — Progress Notes (Deleted)
GI Office Note    Referring Provider: Soyla Dryer, PA-C Primary Care Physician:  Soyla Dryer, PA-C  Primary Gastroenterologist: Elon Alas. Abbey Chatters, DO   Chief Complaint   No chief complaint on file.   History of Present Illness   Amanda Zamora is a 62 y.o. female presenting today for follow up. She has h/o constipation/abnormal colon on CT. She completed colonoscopy as outlined above.     Colonoscopy 01/2022: -21mm polyp removed from ICV, polyp sessile.  -two sessile polyps from ascending colon, 3-40mm, one polyp not retrieved -moderate amt of food particles (nuts seeds) found in entire colon, making visualization difficult. Lavage resulting in clearance with fair visualization, scope changed out due to getting clogged with seeds.  -tubular adenomas -repeat colonoscopy in 3 years with 2 day extended prep, do not eat nuts and seeds for 7 days prior   Medications   Current Outpatient Medications  Medication Sig Dispense Refill   amLODipine (NORVASC) 10 MG tablet TAKE 1 Tablet BY MOUTH ONCE EVERY DAY 90 tablet 1   atorvastatin (LIPITOR) 80 MG tablet Tome una tableta por boca al dormir 90 tablet 1   cyclobenzaprine (FLEXERIL) 10 MG tablet TAKE 1 TABLET BY MOUTH TWICE DAILY AS NEEDED FOR MUSCLE SPASM (Patient not taking: Reported on 03/29/2022) 20 tablet 0   gabapentin (NEURONTIN) 300 MG capsule Take 1 capsule by mouth at bedtime 30 capsule 0   insulin NPH-regular Human (NOVOLIN 70/30 RELION) (70-30) 100 UNIT/ML injection 20 units with breakfast and 20 units with supper if glucose is above 90 and she is eating. 30 mL 3   losartan (COZAAR) 100 MG tablet Take 1 tablet (100 mg total) by mouth daily. 90 tablet 1   meclizine (ANTIVERT) 25 MG tablet Take 1 tablet (25 mg total) by mouth 3 (three) times daily as needed for dizziness. 40 tablet 0   metFORMIN (GLUCOPHAGE) 1000 MG tablet TAKE 1 TABLET BY MOUTH TWICE DAILY WITH A MEAL.   Tome una tableta por boca dos veces  diarias con comida 180 tablet 1   multivitamin-iron-minerals-folic acid (CENTRUM) chewable tablet Chew 1 tablet by mouth daily.     Omega-3 Fatty Acids (FISH OIL) 1000 MG CAPS Take 1 capsule (1,000 mg total) by mouth in the morning and at bedtime. (Patient taking differently: Take 1 capsule by mouth at bedtime.) 60 capsule 2   omeprazole (PRILOSEC) 20 MG capsule TAKE 1 Capsule BY MOUTH ONCE EVERY DAY 90 capsule 1   ondansetron (ZOFRAN-ODT) 4 MG disintegrating tablet Take 1 tablet (4 mg total) by mouth every 8 (eight) hours as needed for up to 15 doses for nausea or vomiting. (Patient not taking: Reported on 03/29/2022) 15 tablet 0   No current facility-administered medications for this visit.    Allergies   Allergies as of 05/03/2022   (No Known Allergies)     Past Medical History   Past Medical History:  Diagnosis Date   Detached retina    GERD (gastroesophageal reflux disease)    History of pyelonephritis    PONV (postoperative nausea and vomiting)    Type 2 diabetes mellitus ([place])     Past Surgical History   Past Surgical History:  Procedure Laterality Date   CHOLECYSTECTOMY N/A 12/23/2019   Procedure: LAPAROSCOPIC CHOLECYSTECTOMY;  Surgeon: Virl Cagey, MD;  Location: [facility];  Service: General;  Laterality: N/A;   COLONOSCOPY WITH PROPOFOL N/A 02/01/2022   Procedure: COLONOSCOPY WITH PROPOFOL;  Surgeon: Eloise Harman, DO;  Location: [facility];  Service: Endoscopy;  Laterality: N/A;  10:00 am   DENTAL SURGERY  06/01/2021   Removed 5 teeth   EYE SURGERY Right    retina   LAPAROSCOPIC APPENDECTOMY N/A 07/05/2020   Procedure: APPENDECTOMY LAPAROSCOPIC;  Surgeon: Aviva Signs, MD;  Location: [facility];  Service: General;  Laterality: N/A;   POLYPECTOMY  02/01/2022   Procedure: POLYPECTOMY;  Surgeon: Eloise Harman, DO;  Location: [facility];  Service: Endoscopy;;   TUBAL LIGATION      Past Family History   Family History  Problem Relation Age of Onset    Breast cancer Sister    Diabetes Sister    Colonic polyp Neg Hx     Past Social History   Social History   Socioeconomic History   Marital status: Single    Spouse name: Not on file   Number of children: Not on file   Years of education: Not on file   Highest education level: Not on file  Occupational History   Not on file  Tobacco Use   Smoking status: Never   Smokeless tobacco: Never  Vaping Use   Vaping Use: Never used  Substance and Sexual Activity   Alcohol use: Not Currently    Comment: previously would drink occ. beer last drink 06/2017   Drug use: No   Sexual activity: Yes    Birth control/protection: None  Other Topics Concern   Not on file  Social History Narrative   Not on file   Social Determinants of Health   Financial Resource Strain: Not on file  Food Insecurity: Not on file  Transportation Needs: Not on file  Physical Activity: Not on file  Stress: Not on file  Social Connections: Not on file  Intimate Partner Violence: Not on file    Review of Systems   General: Negative for anorexia, weight loss, fever, chills, fatigue, weakness. ENT: Negative for hoarseness, difficulty swallowing , nasal congestion. CV: Negative for chest pain, angina, palpitations, dyspnea on exertion, peripheral edema.  Respiratory: Negative for dyspnea at rest, dyspnea on exertion, cough, sputum, wheezing.  GI: See history of present illness. GU:  Negative for dysuria, hematuria, urinary incontinence, urinary frequency, nocturnal urination.  Endo: Negative for unusual weight change.     Physical Exam   LMP 02/15/2011 (Approximate)    General: Well-nourished, well-developed in no acute distress.  Eyes: No icterus. Mouth: Oropharyngeal mucosa moist and pink , no lesions erythema or exudate. Lungs: Clear to auscultation bilaterally.  Heart: Regular rate and rhythm, no murmurs rubs or gallops.  Abdomen: Bowel sounds are normal, nontender, nondistended, no  hepatosplenomegaly or masses,  no abdominal bruits or hernia , no rebound or guarding.  Rectal: ***  Extremities: No lower extremity edema. No clubbing or deformities. Neuro: Alert and oriented x 4   Skin: Warm and dry, no jaundice.   Psych: Alert and cooperative, normal mood and affect.  Labs   Lab Results  Component Value Date   HGBA1C 6.9 (H) 03/28/2022   Lab Results  Component Value Date   CREATININE 0.85 03/28/2022   BUN 15 03/28/2022   NA 138 03/28/2022   K 4.3 03/28/2022   CL 104 03/28/2022   CO2 26 03/28/2022   Lab Results  Component Value Date   ALT 25 03/28/2022   AST 25 03/28/2022   ALKPHOS 66 03/28/2022   BILITOT 0.8 03/28/2022   Lab Results  Component Value Date   WBC 11.0 (H) 02/06/2022   HGB  12.1 02/06/2022   HCT 36.9 02/06/2022   MCV 88.3 02/06/2022   PLT 267 02/06/2022    Imaging Studies   No results found.  Assessment       PLAN   ***   Laureen Ochs. Bobby Rumpf, [place], [facility]

## 2022-05-03 ENCOUNTER — Ambulatory Visit: Payer: Self-pay | Admitting: Gastroenterology

## 2022-05-05 ENCOUNTER — Other Ambulatory Visit: Payer: Self-pay | Admitting: Physician Assistant

## 2022-05-25 ENCOUNTER — Other Ambulatory Visit: Payer: Self-pay | Admitting: Physician Assistant

## 2022-05-26 ENCOUNTER — Ambulatory Visit: Payer: Self-pay | Admitting: Nurse Practitioner

## 2022-05-26 ENCOUNTER — Ambulatory Visit: Payer: Self-pay | Admitting: Nutrition

## 2022-05-31 ENCOUNTER — Ambulatory Visit: Payer: Self-pay | Admitting: Nurse Practitioner

## 2022-05-31 ENCOUNTER — Ambulatory Visit: Payer: Self-pay | Admitting: Nutrition

## 2022-05-31 DIAGNOSIS — E1165 Type 2 diabetes mellitus with hyperglycemia: Secondary | ICD-10-CM

## 2022-05-31 DIAGNOSIS — I10 Essential (primary) hypertension: Secondary | ICD-10-CM

## 2022-05-31 DIAGNOSIS — E782 Mixed hyperlipidemia: Secondary | ICD-10-CM

## 2022-06-01 ENCOUNTER — Other Ambulatory Visit: Payer: Self-pay | Admitting: Physician Assistant

## 2022-06-01 DIAGNOSIS — E1165 Type 2 diabetes mellitus with hyperglycemia: Secondary | ICD-10-CM

## 2022-06-01 DIAGNOSIS — E785 Hyperlipidemia, unspecified: Secondary | ICD-10-CM

## 2022-06-01 DIAGNOSIS — I10 Essential (primary) hypertension: Secondary | ICD-10-CM

## 2022-06-15 ENCOUNTER — Ambulatory Visit (HOSPITAL_COMMUNITY): Payer: Self-pay | Attending: Otology & Neurotology | Admitting: Physical Therapy

## 2022-06-15 DIAGNOSIS — R42 Dizziness and giddiness: Secondary | ICD-10-CM | POA: Insufficient documentation

## 2022-06-15 DIAGNOSIS — M542 Cervicalgia: Secondary | ICD-10-CM | POA: Insufficient documentation

## 2022-06-15 DIAGNOSIS — H8113 Benign paroxysmal vertigo, bilateral: Secondary | ICD-10-CM | POA: Insufficient documentation

## 2022-06-15 NOTE — Therapy (Signed)
OUTPATIENT PHYSICAL THERAPY VESTIBULAR EVALUATION     Patient Name: Amanda Zamora MRN: [id number] DOB:[birth year], 62 y.o., female Today's Date: 06/15/2022  END OF SESSION:  PT End of Session - 06/15/22 0857     Visit Number 1    Number of Visits 8    Date for PT Re-Evaluation 07/13/22    Authorization Type None    PT Start Time 0825    PT Stop Time 0858    PT Time Calculation (min) 33 min    Activity Tolerance Patient limited by pain    Behavior During Therapy [facility] for tasks assessed/performed             Past Medical History:  Diagnosis Date   Detached retina    GERD (gastroesophageal reflux disease)    History of pyelonephritis    PONV (postoperative nausea and vomiting)    Type 2 diabetes mellitus (HCC)    Past Surgical History:  Procedure Laterality Date   CHOLECYSTECTOMY N/A 12/23/2019   Procedure: LAPAROSCOPIC CHOLECYSTECTOMY;  Surgeon: Lucretia Roers, MD;  Location: [facility];  Service: General;  Laterality: N/A;   COLONOSCOPY WITH PROPOFOL N/A 02/01/2022   Procedure: COLONOSCOPY WITH PROPOFOL;  Surgeon: Lanelle Bal, DO;  [facility];  Service: Endoscopy;  Laterality: N/A;  10:00 am   DENTAL SURGERY  06/01/2021   Removed 5 teeth   EYE SURGERY Right    retina   LAPAROSCOPIC APPENDECTOMY N/A 07/05/2020   Procedure: APPENDECTOMY LAPAROSCOPIC;  Surgeon: Franky Macho, MD;  Location: [facility];  Service: General;  Laterality: N/A;   POLYPECTOMY  02/01/2022   Procedure: POLYPECTOMY;  Surgeon: Lanelle Bal, DO;  Location[facility];  Service: Endoscopy;;   TUBAL LIGATION     Patient Active Problem List   Diagnosis Date Noted   Abnormal CT scan, colon 01/03/2022   Constipation 01/03/2022   Vomiting 07/05/2020   Hyperglycemia due to diabetes mellitus (HCC) 07/05/2020   Acute appendicitis 07/04/2020   Abdominal pain 04/01/2020   Leukocytosis 04/01/2020   AKI (acute kidney injury) (HCC) 04/01/2020   Hypercalcemia 04/01/2020    Hypoglycemia 03/31/2020   Chronic cholecystitis 12/19/2019   Abdominal pain, right upper quadrant    Abnormal mammogram 10/10/2018   Type 2 diabetes mellitus with diabetic neuropathy, unspecified (HCC) 08/01/2018   Not proficient in English language 08/01/2018   Screening for colon cancer 08/01/2018   Hyperlipidemia 03/10/2015   HTN (hypertension) 03/10/2015   Obesity, unspecified 03/10/2015   GERD (gastroesophageal reflux disease) 03/10/2015   Precordial pain 03/28/2013   Type 2 diabetes mellitus (HCC) 03/28/2013    PCP: Jacquelin Hawking PA REFERRING PROVIDER: Tempie Hoist, MD  REFERRING DIAG: H81.13 (ICD-10-CM) - Benign paroxysmal vertigo, bilateral  THERAPY DIAG:  BPPV (benign paroxysmal positional vertigo), bilateral  ONSET DATE: 7 months ago   Rationale for Evaluation and Treatment: Rehabilitation  SUBJECTIVE:   SUBJECTIVE STATEMENT: Patient presents to therapy with complaint of insidious onset of vertigo 7 months ago. She says one Saturday she woke up and noticed she gets dizzy when she bends over. She went to the hospital and was diagnosed with vertigo.   Pt accompanied by: interpreter: Lissa Hoard   PERTINENT HISTORY: NA  PAIN:  Are you having pain? Yes: NPRS scale: 9/10 Pain location: RT jaw Pain description: aching Aggravating factors: constant pain, bending over  Relieving factors: nothing   PRECAUTIONS: None  WEIGHT BEARING RESTRICTIONS: No  FALLS: Has patient fallen in last 6 months? No  LIVING ENVIRONMENT: Lives  with: lives with their family Lives in: House/apartment  PLOF: Independent with basic ADLs  PATIENT GOALS: Heel, feel better   OBJECTIVE:   DIAGNOSTIC FINDINGS: IMPRESSION: No acute finding.    COGNITION: Overall cognitive status: Within functional limits for tasks assessed  Cervical ROM:    Active A/PROM (deg) eval  Flexion 25% limited  Extension 75% limited  Right lateral flexion 75% limited  Left lateral flexion 75%  limited  Right rotation 25% limited  Left rotation 25% limited  (Blank rows = not tested)   VESTIBULAR ASSESSMENT:  OCULOMOTOR EXAM:  Ocular Alignment: normal  Ocular ROM: No Limitations  Spontaneous Nystagmus: absent  Gaze-Induced Nystagmus: absent  Smooth Pursuits:  difficulty tracking vertically     MOTION SENSITIVITY:  Motion Sensitivity Quotient Intensity: 0 = none, 1 = Lightheaded, 2 = Mild, 3 = Moderate, 4 = Severe, 5 = Vomiting  Intensity  1. Sitting to supine 1  2. Supine to L side 0  3. Supine to R side 0  4. Supine to sitting   5. L Hallpike-Dix 1  6. Up from L  3  7. R Hallpike-Dix 1  8. Up from R  3  9. Sitting, head tipped to L knee   10. Head up from L knee   11. Sitting, head tipped to R knee   12. Head up from R knee   13. Sitting head turns x5 2  14.Sitting head nods x5 3  15. In stance, 180 turn to L    16. In stance, 180 turn to R     VESTIBULAR TREATMENT:                                                                                                   DATE:  06/15/22 Eval  Canalith Repositioning:  Epley Right: Number of Reps: 1 and Response to Treatment: comment: (-) and Epley Left: Comment: (-) bilateral   PATIENT EDUCATION: Education details: On Eval findings, POC and HEP  Person educated: Patient Education method: Explanation Education comprehension: verbalized understanding  HOME EXERCISE PROGRAM: Access Code: [email or web address] URL: [email or web address] Date: 06/15/2022 Prepared by: Georges Lynch  Exercises - Seated Horizontal Smooth Pursuit  - 3 x daily - 7 x weekly - 3 sets - 10 reps - Seated Vertical Smooth Pursuit  - 3 x daily - 7 x weekly - 3 sets - 10 reps  GOALS:  SHORT TERM GOALS: Target date: 06/29/2022  Patient will be independent with initial HEP and self-management strategies to improve functional outcomes Baseline:  Goal status: INITIAL     LONG TERM GOALS: Target date: 07/13/2022  Patient will be  independent with advanced HEP and self-management strategies to improve functional outcomes Baseline:  Goal status: INITIAL  2.  Patient will reports at least 50% improvement in subjective complaint of vertigo to indicate improvement in functional outcomes Baseline:  Goal status: INITIAL  3.  Patient will report reduction of neck/jaw pain to <3/10 for improved quality of life and ability to perform ADLs  Baseline: 9/10 Goal status: INITIAL    ASSESSMENT:  CLINICAL IMPRESSION: Patient  is a 62 y.o. female who presents to physical therapy with complaint of vertigo. Patient demonstrates increased vestibular symptoms with provocative testing which is negatively impacting patient ability to perform ADLs and functional mobility tasks. Patient will benefit from skilled physical therapy services to address these deficits to improve level of function with ADLs, functional mobility tasks, and reduce risk for falls.    OBJECTIVE IMPAIRMENTS: Abnormal gait, decreased balance, decreased mobility, decreased ROM, dizziness, hypomobility, impaired flexibility, improper body mechanics, postural dysfunction, and pain.   ACTIVITY LIMITATIONS: lifting, bending, transfers, and locomotion level  PARTICIPATION LIMITATIONS: cleaning, laundry, shopping, and community activity  PERSONAL FACTORS: Behavior pattern and Time since onset of injury/illness/exacerbation are also affecting patient's functional outcome.   REHAB POTENTIAL: Good  CLINICAL DECISION MAKING: Stable/uncomplicated  EVALUATION COMPLEXITY: Low   PLAN:  PT FREQUENCY: 1-2x/week  PT DURATION: 4 weeks  PLANNED INTERVENTIONS: Therapeutic exercises, Therapeutic activity, Neuromuscular re-education, Balance training, Gait training, Patient/Family education, Self Care, Joint mobilization, Vestibular training, and Canalith repositioning Therapeutic exercises, Therapeutic activity, Neuromuscular re-education, Balance training, Gait training,  Patient/Family education, Joint manipulation, Joint mobilization, Stair training, Aquatic Therapy, Dry Needling, Electrical stimulation, Spinal manipulation, Spinal mobilization, Cryotherapy, Moist heat, scar mobilization, Taping, Traction, Ultrasound, Biofeedback, Ionotophoresis 4mg /ml Dexamethasone, and Manual therapy.   PLAN FOR NEXT SESSION: Progress habituation and neck ROM exercise/ stretching as tolerated. Standing balance and dynamic challenge when ready.   8:58 AM, 06/15/22 Georges Lynch PT DPT  Physical Therapist with [facility]  [phone]

## 2022-06-16 ENCOUNTER — Ambulatory Visit: Payer: Self-pay | Admitting: Nurse Practitioner

## 2022-06-16 ENCOUNTER — Ambulatory Visit: Payer: Self-pay | Admitting: Nutrition

## 2022-06-16 DIAGNOSIS — E1165 Type 2 diabetes mellitus with hyperglycemia: Secondary | ICD-10-CM

## 2022-06-16 DIAGNOSIS — E782 Mixed hyperlipidemia: Secondary | ICD-10-CM

## 2022-06-23 ENCOUNTER — Other Ambulatory Visit: Payer: Self-pay | Admitting: Physician Assistant

## 2022-06-28 ENCOUNTER — Ambulatory Visit: Payer: Self-pay | Admitting: Physician Assistant

## 2022-06-29 ENCOUNTER — Other Ambulatory Visit (HOSPITAL_COMMUNITY)
Admission: RE | Admit: 2022-06-29 | Discharge: 2022-06-29 | Disposition: A | Discharge status: Home / Self Care | Payer: Self-pay | Source: Ambulatory Visit | Attending: Physician Assistant | Admitting: Physician Assistant

## 2022-06-29 DIAGNOSIS — E1165 Type 2 diabetes mellitus with hyperglycemia: Secondary | ICD-10-CM | POA: Insufficient documentation

## 2022-06-29 DIAGNOSIS — E785 Hyperlipidemia, unspecified: Secondary | ICD-10-CM | POA: Insufficient documentation

## 2022-06-29 DIAGNOSIS — I10 Essential (primary) hypertension: Secondary | ICD-10-CM | POA: Insufficient documentation

## 2022-06-29 LAB — COMPREHENSIVE METABOLIC PANEL
ALT: 43 U/L (ref 0–44)
AST: 29 U/L (ref 15–41)
Albumin: 3.8 g/dL (ref 3.5–5.0)
Alkaline Phosphatase: 89 U/L (ref 38–126)
Anion gap: 9 (ref 5–15)
BUN: 13 mg/dL (ref 8–23)
CO2: 27 mmol/L (ref 22–32)
Calcium: 9.3 mg/dL (ref 8.9–10.3)
Chloride: 100 mmol/L (ref 98–111)
Creatinine, Ser: 0.83 mg/dL (ref 0.44–1.00)
GFR, Estimated: >60 mL/min (ref >60)
Glucose, Bld: 132 mg/dL — ABNORMAL HIGH (ref 70–99)
Potassium: 4.5 mmol/L (ref 3.5–5.1)
Sodium: 136 mmol/L (ref 135–145)
Total Bilirubin: 0.8 mg/dL (ref 0.3–1.2)
Total Protein: 7.5 g/dL (ref 6.5–8.1)

## 2022-06-29 LAB — LIPID PANEL
Cholesterol: 119 mg/dL (ref 0–200)
HDL: 41 mg/dL (ref >40)
LDL Cholesterol: 46 mg/dL (ref 0–99)
Total CHOL/HDL Ratio: 2.9 RATIO
Triglycerides: 159 mg/dL — ABNORMAL HIGH (ref <150)
VLDL: 32 mg/dL (ref 0–40)

## 2022-07-01 ENCOUNTER — Encounter (HOSPITAL_COMMUNITY): Payer: Self-pay

## 2022-07-05 ENCOUNTER — Encounter: Payer: Self-pay | Admitting: Physician Assistant

## 2022-07-05 ENCOUNTER — Ambulatory Visit: Payer: Self-pay | Admitting: Physician Assistant

## 2022-07-05 VITALS — BP 110/66 | HR 67 | Temp 97.5°F | Ht 60.5 in | Wt 164.0 lb

## 2022-07-05 DIAGNOSIS — E1165 Type 2 diabetes mellitus with hyperglycemia: Secondary | ICD-10-CM

## 2022-07-05 DIAGNOSIS — Z789 Other specified health status: Secondary | ICD-10-CM

## 2022-07-05 DIAGNOSIS — E785 Hyperlipidemia, unspecified: Secondary | ICD-10-CM

## 2022-07-05 DIAGNOSIS — I10 Essential (primary) hypertension: Secondary | ICD-10-CM

## 2022-07-05 DIAGNOSIS — Z1239 Encounter for other screening for malignant neoplasm of breast: Secondary | ICD-10-CM

## 2022-07-05 DIAGNOSIS — E11319 Type 2 diabetes mellitus with unspecified diabetic retinopathy without macular edema: Secondary | ICD-10-CM

## 2022-07-05 NOTE — Progress Notes (Signed)
BP 110/66   Pulse 67   Temp (!) 97.5 F (36.4 C)   Ht 5' 0.5" (1.537 m)   Wt 164 lb (74.4 kg)   LMP 02/15/2011 (Approximate)   SpO2 98%   BMI 31.50 kg/m    Subjective:    Patient ID: Amanda Zamora, female    DOB: 02-Jun-1960, 62 y.o.   MRN: [id number]  HPI: Amanda Zamora is a 62 y.o. female presenting on 07/05/2022 for Hypertension and Hyperlipidemia   HPI  Chief Complaint  Patient presents with   Hypertension   Hyperlipidemia     Pt has Eye appt 08/15/22 at 8:15am at [facility] with Dr Mitzi Davenport  She says she No longer has vertigo.    She was seen by ENT at [facility]   She says it was her gums causing the vertigo.  She is having Problems with her gums and teeth.  She says it Feels swollen and it hurts.  She brushes bid.  Pain is on upper where she doesn't have teeth.  She is supposed to go get her dentures which are being made.   She says she has discussed her gum issues with her dentist.    Relevant past medical, surgical, family and social history reviewed and updated as indicated. Interim medical history since our last visit reviewed. Allergies and medications reviewed and updated.    Current Outpatient Medications:    amLODipine (NORVASC) 10 MG tablet, TAKE 1 Tablet BY MOUTH ONCE EVERY DAY, Disp: 90 tablet, Rfl: 1   atorvastatin (LIPITOR) 80 MG tablet, TAKE 1 Tablet BY MOUTH ONCE EVERY NIGHT AT BEDTIME, Disp: 90 tablet, Rfl: 1   gabapentin (NEURONTIN) 300 MG capsule, Take 1 capsule by mouth at bedtime, Disp: 30 capsule, Rfl: 0   insulin NPH-regular Human (NOVOLIN 70/30 RELION) (70-30) 100 UNIT/ML injection, 20 units with breakfast and 20 units with supper if glucose is above 90 and she is eating., Disp: 30 mL, Rfl: 3   losartan (COZAAR) 100 MG tablet, TAKE 1 Tablet BY MOUTH ONCE EVERY DAY, Disp: 90 tablet, Rfl: 1   metFORMIN (GLUCOPHAGE) 1000 MG tablet, TAKE 1 TABLET BY MOUTH TWICE DAILY WITH A MEAL.   Tome una tableta por boca dos veces diarias con comida, Disp: 180  tablet, Rfl: 1   multivitamin-iron-minerals-folic acid (CENTRUM) chewable tablet, Chew 1 tablet by mouth daily., Disp: , Rfl:    Omega-3 Fatty Acids (FISH OIL) 1000 MG CAPS, Take 1 capsule (1,000 mg total) by mouth in the morning and at bedtime. (Patient taking differently: Take 1 capsule by mouth at bedtime.), Disp: 60 capsule, Rfl: 2   omeprazole (PRILOSEC) 20 MG capsule, TAKE 1 Capsule BY MOUTH ONCE EVERY DAY, Disp: 90 capsule, Rfl: 1   cyclobenzaprine (FLEXERIL) 10 MG tablet, TAKE 1 TABLET BY MOUTH TWICE DAILY AS NEEDED FOR MUSCLE SPASM (Patient not taking: Reported on 03/29/2022), Disp: 20 tablet, Rfl: 0     Review of Systems  Per HPI unless specifically indicated above     Objective:    BP 110/66   Pulse 67   Temp (!) 97.5 F (36.4 C)   Ht 5' 0.5" (1.537 m)   Wt 164 lb (74.4 kg)   LMP 02/15/2011 (Approximate)   SpO2 98%   BMI 31.50 kg/m   Wt Readings from Last 3 Encounters:  07/05/22 164 lb (74.4 kg)  03/29/22 159 lb 12 oz (72.5 kg)  02/06/22 156 lb 15.5 oz (71.2 kg)    Physical Exam Vitals reviewed.  Constitutional:  General: She is not in acute distress.    Appearance: She is well-developed. She is not toxic-appearing.  HENT:     Head: Normocephalic and atraumatic.     Mouth/Throat:     Mouth: Mucous membranes are moist.     Pharynx: Oropharynx is clear.     Comments: Upper edentulous.  No dental abscess seen.   Cardiovascular:     Rate and Rhythm: Normal rate and regular rhythm.     Pulses:          Dorsalis pedis pulses are 2+ on the right side and 2+ on the left side.  Pulmonary:     Effort: Pulmonary effort is normal.     Breath sounds: Normal breath sounds.  Abdominal:     General: Bowel sounds are normal.     Palpations: Abdomen is soft. There is no mass.     Tenderness: There is no abdominal tenderness.  Musculoskeletal:     Cervical back: Neck supple.     Right lower leg: No edema.     Left lower leg: No edema.  Feet:     Right foot:      Skin integrity: Skin integrity normal.     Left foot:     Skin integrity: Skin integrity normal.  Lymphadenopathy:     Cervical: No cervical adenopathy.  Skin:    General: Skin is warm and dry.  Neurological:     Mental Status: She is alert and oriented to person, place, and time.  Psychiatric:        Behavior: Behavior normal.     Results for orders placed or performed during the hospital encounter of 06/29/22  Lipid panel  Result Value Ref Range   Cholesterol 119 0 - 200 mg/dL   Triglycerides 962 (H) <150 mg/dL   HDL 41 >95 mg/dL   Total CHOL/HDL Ratio 2.9 RATIO   VLDL 32 0 - 40 mg/dL   LDL Cholesterol 46 0 - 99 mg/dL  Comprehensive metabolic panel  Result Value Ref Range   Sodium 136 135 - 145 mmol/L   Potassium 4.5 3.5 - 5.1 mmol/L   Chloride 100 98 - 111 mmol/L   CO2 27 22 - 32 mmol/L   Glucose, Bld 132 (H) 70 - 99 mg/dL   BUN 13 8 - 23 mg/dL   Creatinine, Ser 2.84 0.44 - 1.00 mg/dL   Calcium 9.3 8.9 - 13.2 mg/dL   Total Protein 7.5 6.5 - 8.1 g/dL   Albumin 3.8 3.5 - 5.0 g/dL   AST 29 15 - 41 U/L   ALT 43 0 - 44 U/L   Alkaline Phosphatase 89 38 - 126 U/L   Total Bilirubin 0.8 0.3 - 1.2 mg/dL   GFR, Estimated >44 >01 mL/min   Anion gap 9 5 - 15      Assessment & Plan:    Encounter Diagnoses  Name Primary?   Type 2 diabetes mellitus with hyperglycemia, unspecified whether long term insulin use (HCC) Yes   Essential hypertension    Hyperlipidemia, unspecified hyperlipidemia type    Not proficient in [place] language    Diabetic retinopathy associated with type 2 diabetes mellitus, macular edema presence unspecified, unspecified laterality, unspecified retinopathy severity (HCC)    Encounter for screening for malignant neoplasm of breast, unspecified screening modality       -pt is encouraged to continue discussing sore gums with her dentist -reviewed labs with pt -Re-oredered screening mammogram -DM Foot exam due August -pt to continue  current  medications -pt encouraged to keep appointment with eye doctor -pt will follow up here 3 months and will update PAP at that time.  She is to contact office sooner prn

## 2022-07-12 ENCOUNTER — Ambulatory Visit (HOSPITAL_COMMUNITY): Payer: Self-pay

## 2022-07-12 DIAGNOSIS — R42 Dizziness and giddiness: Secondary | ICD-10-CM

## 2022-07-12 DIAGNOSIS — M542 Cervicalgia: Secondary | ICD-10-CM

## 2022-07-12 NOTE — Therapy (Addendum)
OUTPATIENT PHYSICAL THERAPY VESTIBULAR EVALUATION     Patient Name: Amanda Zamora MRN: [id number] DOB:[birth year], 61 y.o., female Today's Date: 07/12/2022  END OF SESSION:  PT End of Session - 07/12/22 0739     Visit Number 2    Number of Visits 8    Date for PT Re-Evaluation 07/13/22    Authorization Type None    PT Start Time 0735    PT Stop Time 0815    PT Time Calculation (min) 40 min    Activity Tolerance Patient tolerated treatment well    Behavior During Therapy [facility] for tasks assessed/performed             Past Medical History:  Diagnosis Date   Detached retina    GERD (gastroesophageal reflux disease)    History of pyelonephritis    PONV (postoperative nausea and vomiting)    Type 2 diabetes mellitus (HCC)    Past Surgical History:  Procedure Laterality Date   CHOLECYSTECTOMY N/A 12/23/2019   Procedure: LAPAROSCOPIC CHOLECYSTECTOMY;  Surgeon: Lucretia Roers, MD;  Location: [facility];  Service: General;  Laterality: N/A;   COLONOSCOPY WITH PROPOFOL N/A 02/01/2022   Procedure: COLONOSCOPY WITH PROPOFOL;  Surgeon: Lanelle Bal, DO;  Location[facility];  Service: Endoscopy;  Laterality: N/A;  10:00 am   DENTAL SURGERY  06/01/2021   Removed 5 teeth   EYE SURGERY Right    retina   LAPAROSCOPIC APPENDECTOMY N/A 07/05/2020   Procedure: APPENDECTOMY LAPAROSCOPIC;  Surgeon: Franky Macho, MD;  Location: [facility];  Service: General;  Laterality: N/A;   POLYPECTOMY  02/01/2022   Procedure: POLYPECTOMY;  Surgeon: Lanelle Bal, DO;  Location[facility];  Service: Endoscopy;;   TUBAL LIGATION     Patient Active Problem List   Diagnosis Date Noted   Abnormal CT scan, colon 01/03/2022   Constipation 01/03/2022   Vomiting 07/05/2020   Hyperglycemia due to diabetes mellitus (HCC) 07/05/2020   Acute appendicitis 07/04/2020   Abdominal pain 04/01/2020   Leukocytosis 04/01/2020   AKI (acute kidney injury) (HCC) 04/01/2020   Hypercalcemia 04/01/2020    Hypoglycemia 03/31/2020   Chronic cholecystitis 12/19/2019   Abdominal pain, right upper quadrant    Abnormal mammogram 10/10/2018   Type 2 diabetes mellitus with diabetic neuropathy, unspecified (HCC) 08/01/2018   Not proficient in English language 08/01/2018   Screening for colon cancer 08/01/2018   Hyperlipidemia 03/10/2015   HTN (hypertension) 03/10/2015   Obesity, unspecified 03/10/2015   GERD (gastroesophageal reflux disease) 03/10/2015   Precordial pain 03/28/2013   Type 2 diabetes mellitus (HCC) 03/28/2013    PCP: Jacquelin Hawking PA REFERRING PROVIDER: Tempie Hoist, MD  REFERRING DIAG: H81.13 (ICD-10-CM) - Benign paroxysmal vertigo, bilateral  THERAPY DIAG:  Dizziness and giddiness  Neck pain  ONSET DATE: 7 months ago   Rationale for Evaluation and Treatment: Rehabilitation  SUBJECTIVE:   SUBJECTIVE STATEMENT: Patient reports 8/10 pain today neck and jaw area; reports that she had to miss last scheduled appt due to another medical appt  Eval:Patient presents to therapy with complaint of insidious onset of vertigo 7 months ago. She says one Saturday she woke up and noticed she gets dizzy when she bends over. She went to the hospital and was diagnosed with vertigo.   Pt accompanied by: interpreter: Lissa Hoard   PERTINENT HISTORY: NA  PAIN:  Are you having pain? Yes: NPRS scale: 9/10 Pain location: RT jaw Pain description: aching Aggravating factors: constant pain, bending over  Relieving factors:  nothing   PRECAUTIONS: None  WEIGHT BEARING RESTRICTIONS: No  FALLS: Has patient fallen in last 6 months? No  LIVING ENVIRONMENT: Lives with: lives with their family Lives in: House/apartment  PLOF: Independent with basic ADLs  PATIENT GOALS: Heel, feel better   OBJECTIVE:   DIAGNOSTIC FINDINGS: IMPRESSION: No acute finding.    COGNITION: Overall cognitive status: Within functional limits for tasks assessed  Cervical ROM:    Active A/PROM  (deg) eval  Flexion 25% limited  Extension 75% limited  Right lateral flexion 75% limited  Left lateral flexion 75% limited  Right rotation 25% limited  Left rotation 25% limited  (Blank rows = not tested)   VESTIBULAR ASSESSMENT:  OCULOMOTOR EXAM:  Ocular Alignment: normal  Ocular ROM: No Limitations  Spontaneous Nystagmus: absent  Gaze-Induced Nystagmus: absent  Smooth Pursuits:  difficulty tracking vertically     MOTION SENSITIVITY:  Motion Sensitivity Quotient Intensity: 0 = none, 1 = Lightheaded, 2 = Mild, 3 = Moderate, 4 = Severe, 5 = Vomiting  Intensity  1. Sitting to supine 1  2. Supine to L side 0  3. Supine to R side 0  4. Supine to sitting   5. L Hallpike-Dix 1  6. Up from L  3  7. R Hallpike-Dix 1  8. Up from R  3  9. Sitting, head tipped to L knee   10. Head up from L knee   11. Sitting, head tipped to R knee   12. Head up from R knee   13. Sitting head turns x5 2  14.Sitting head nods x5 3  15. In stance, 180 turn to L    16. In stance, 180 turn to R     VESTIBULAR TREATMENT:                                                                                                   DATE:  07/10/22 Review of HEP and goals Sitting  Smooth pursuit horizontal and vertical x 5 each Manual STW to cervical spine to decrease pain and increase tissue extensibility x 10' no other treatment performed during manual treatment  Supine: Cervical retractions x 10 Scapular retractions x 10    06/15/22 Eval  Canalith Repositioning:  Epley Right: Number of Reps: 1 and Response to Treatment: comment: (-) and Epley Left: Comment: (-) bilateral   PATIENT EDUCATION: Education details: On Eval findings, POC and HEP  Person educated: Patient Education method: Explanation Education comprehension: verbalized understanding  HOME EXERCISE PROGRAM: 07/12/22 cervical retractions and scapular retractions in supine Access Code: [email or web address] URL:  [email or web address] Date: 06/15/2022 Prepared by: Georges Sumner Boesch  Exercises - Seated Horizontal Smooth Pursuit  - 3 x daily - 7 x weekly - 3 sets - 10 reps - Seated Vertical Smooth Pursuit  - 3 x daily - 7 x weekly - 3 sets - 10 reps  GOALS:  SHORT TERM GOALS: Target date: 06/29/2022  Patient will be independent with initial HEP and self-management strategies to improve functional outcomes Baseline:  Goal status: IN PROGRESS     LONG TERM GOALS: Target date:  07/13/2022  Patient will be independent with advanced HEP and self-management strategies to improve functional outcomes Baseline:  Goal status: IN PROGRESS  2.  Patient will reports at least 50% improvement in subjective complaint of vertigo to indicate improvement in functional outcomes Baseline:  Goal status: IN PROGRESS  3.  Patient will report reduction of neck/jaw pain to <3/10 for improved quality of life and ability to perform ADLs  Baseline: 9/10 Goal status: IN PROGRESS    ASSESSMENT:  CLINICAL IMPRESSION: Today's session started with a review of HEP and goals; patient verbalizes agreement with set rehab goals. Trial of sitting cervical retractions was painful so tried in supine with good tolerance; added scapular retractions as well to strengthen postural musculature.  Added manual to decrease pain and increase tissue extensibility.  Decreased pain levels to 6/10 at end of treatment.  Updated HEP. Patient will benefit from continued skilled therapy services to address deficits and promote return to optimal function.      Eval:Patient is a 62 y.o. female who presents to physical therapy with complaint of vertigo. Patient demonstrates increased vestibular symptoms with provocative testing which is negatively impacting patient ability to perform ADLs and functional mobility tasks. Patient will benefit from skilled physical therapy services to address these deficits to improve level of function with  ADLs, functional mobility tasks, and reduce risk for falls.    OBJECTIVE IMPAIRMENTS: Abnormal gait, decreased balance, decreased mobility, decreased ROM, dizziness, hypomobility, impaired flexibility, improper body mechanics, postural dysfunction, and pain.   ACTIVITY LIMITATIONS: lifting, bending, transfers, and locomotion level  PARTICIPATION LIMITATIONS: cleaning, laundry, shopping, and community activity  PERSONAL FACTORS: Behavior pattern and Time since onset of injury/illness/exacerbation are also affecting patient's functional outcome.   REHAB POTENTIAL: Good  CLINICAL DECISION MAKING: Stable/uncomplicated  EVALUATION COMPLEXITY: Low   PLAN:  PT FREQUENCY: 1-2x/week  PT DURATION: 4 weeks  PLANNED INTERVENTIONS: Therapeutic exercises, Therapeutic activity, Neuromuscular re-education, Balance training, Gait training, Patient/Family education, Self Care, Joint mobilization, Vestibular training, and Canalith repositioning Therapeutic exercises, Therapeutic activity, Neuromuscular re-education, Balance training, Gait training, Patient/Family education, Joint manipulation, Joint mobilization, Stair training, Aquatic Therapy, Dry Needling, Electrical stimulation, Spinal manipulation, Spinal mobilization, Cryotherapy, Moist heat, scar mobilization, Taping, Traction, Ultrasound, Biofeedback, Ionotophoresis 4mg /ml Dexamethasone, and Manual therapy.   PLAN FOR NEXT SESSION: Progress habituation and neck ROM exercise/ stretching as tolerated. Standing balance and dynamic challenge when ready. Reassess next visit 8:17 AM, 07/12/22 Nehemyah Foushee Small Trequan Marsolek MPT [place] [facility] [place] [phone]

## 2022-07-19 ENCOUNTER — Ambulatory Visit (HOSPITAL_COMMUNITY): Payer: Self-pay | Attending: Otology & Neurotology

## 2022-07-19 DIAGNOSIS — M542 Cervicalgia: Secondary | ICD-10-CM | POA: Insufficient documentation

## 2022-07-19 DIAGNOSIS — R42 Dizziness and giddiness: Secondary | ICD-10-CM | POA: Insufficient documentation

## 2022-07-19 NOTE — Therapy (Addendum)
OUTPATIENT PHYSICAL THERAPY VESTIBULAR TREATMENT/PROGRESS Progress Note Reporting Period 06/15/2022 to 07/19/2022  See note below for Objective Data and Assessment of Progress/Goals.         Patient Name: Amanda Zamora MRN: [id number] DOB:Jun 30, 1960, 62 y.o., female Today's Date: 07/19/2022  END OF SESSION:  PT End of Session - 07/19/22 0730     Visit Number 3    Number of Visits 8    Date for PT Re-Evaluation 08/18/22    Authorization Type None    PT Start Time 0731    PT Stop Time 0812    PT Time Calculation (min) 41 min    Activity Tolerance Patient tolerated treatment well    Behavior During Therapy [facility] for tasks assessed/performed             Past Medical History:  Diagnosis Date   Detached retina    GERD (gastroesophageal reflux disease)    History of pyelonephritis    PONV (postoperative nausea and vomiting)    Type 2 diabetes mellitus (HCC)    Past Surgical History:  Procedure Laterality Date   CHOLECYSTECTOMY N/A 12/23/2019   Procedure: LAPAROSCOPIC CHOLECYSTECTOMY;  Surgeon: Lucretia Roers, MD;  Location: [facility];  Service: General;  Laterality: N/A;   COLONOSCOPY WITH PROPOFOL N/A 02/01/2022   Procedure: COLONOSCOPY WITH PROPOFOL;  Surgeon: Lanelle Bal, DO;  [facility];  Service: Endoscopy;  Laterality: N/A;  10:00 am   DENTAL SURGERY  06/01/2021   Removed 5 teeth   EYE SURGERY Right    retina   LAPAROSCOPIC APPENDECTOMY N/A 07/05/2020   Procedure: APPENDECTOMY LAPAROSCOPIC;  Surgeon: Franky Macho, MD;  Location: [facility];  Service: General;  Laterality: N/A;   POLYPECTOMY  02/01/2022   Procedure: POLYPECTOMY;  Surgeon: Lanelle Bal, DO;  [facility];  Service: Endoscopy;;   TUBAL LIGATION     Patient Active Problem List   Diagnosis Date Noted   Abnormal CT scan, colon 01/03/2022   Constipation 01/03/2022   Vomiting 07/05/2020   Hyperglycemia due to diabetes mellitus (HCC) 07/05/2020   Acute  appendicitis 07/04/2020   Abdominal pain 04/01/2020   Leukocytosis 04/01/2020   AKI (acute kidney injury) (HCC) 04/01/2020   Hypercalcemia 04/01/2020   Hypoglycemia 03/31/2020   Chronic cholecystitis 12/19/2019   Abdominal pain, right upper quadrant    Abnormal mammogram 10/10/2018   Type 2 diabetes mellitus with diabetic neuropathy, unspecified (HCC) 08/01/2018   Not proficient in English language 08/01/2018   Screening for colon cancer 08/01/2018   Hyperlipidemia 03/10/2015   HTN (hypertension) 03/10/2015   Obesity, unspecified 03/10/2015   GERD (gastroesophageal reflux disease) 03/10/2015   Precordial pain 03/28/2013   Type 2 diabetes mellitus (HCC) 03/28/2013    PCP: Jacquelin Hawking PA REFERRING PROVIDER: Tempie Hoist, MD  REFERRING DIAG: H81.13 (ICD-10-CM) - Benign paroxysmal vertigo, bilateral  THERAPY DIAG:  Neck pain - Plan: PT plan of care cert/re-cert  Dizziness and giddiness - Plan: PT plan of care cert/re-cert  ONSET DATE: 7 months ago   Rationale for Evaluation and Treatment: Rehabilitation  SUBJECTIVE:   SUBJECTIVE STATEMENT: "80%" better ; right side jaw pain 8/10  Eval:Patient presents to therapy with complaint of insidious onset of vertigo 7 months ago. She says one Saturday she woke up and noticed she gets dizzy when she bends over. She went to the hospital and was diagnosed with vertigo.   Pt accompanied by: interpreter: Lissa Hoard   PERTINENT HISTORY: NA  PAIN:  Are you having  pain? Yes: NPRS scale: 9/10 Pain location: RT jaw Pain description: aching Aggravating factors: constant pain, bending over  Relieving factors: nothing   PRECAUTIONS: None  WEIGHT BEARING RESTRICTIONS: No  FALLS: Has patient fallen in last 6 months? No  LIVING ENVIRONMENT: Lives with: lives with their family Lives in: House/apartment  PLOF: Independent with basic ADLs  PATIENT GOALS: Heel, feel better   OBJECTIVE:   DIAGNOSTIC FINDINGS: IMPRESSION: No  acute finding.    COGNITION: Overall cognitive status: Within functional limits for tasks assessed  Cervical ROM:  some discomfort at end ranges  Active A/PROM (deg) eval AROM 07/19/22 degrees  Flexion 25% limited 29 degrees  Extension 75% limited 50 degrees  Right lateral flexion 75% limited 28  Left lateral flexion 75% limited 34  Right rotation 25% limited 59  Left rotation 25% limited 61  (Blank rows = not tested)   VESTIBULAR ASSESSMENT:  OCULOMOTOR EXAM:  Ocular Alignment: normal  Ocular ROM: No Limitations  Spontaneous Nystagmus: absent  Gaze-Induced Nystagmus: absent  Smooth Pursuits:  difficulty tracking vertically     MOTION SENSITIVITY:  Motion Sensitivity Quotient Intensity: 0 = none, 1 = Lightheaded, 2 = Mild, 3 = Moderate, 4 = Severe, 5 = Vomiting  Intensity 07/19/22  1. Sitting to supine 1   2. Supine to L side 0   3. Supine to R side 0   4. Supine to sitting    5. L Hallpike-Dix 1   6. Up from L  3   7. R Hallpike-Dix 1   8. Up from R  3   9. Sitting, head tipped to L knee    10. Head up from L knee    11. Sitting, head tipped to R knee    12. Head up from R knee    13. Sitting head turns x5 2 0 "pulling" in cervical spine  14.Sitting head nods x5 3 0 "pulling" in cervical spine  15. In stance, 180 turn to L     16. In stance, 180 turn to R      VESTIBULAR TREATMENT:                                                                                                   DATE:  07/19/22 Progress note AROM of cervical spine see above Sitting: Head turns x 5 Head nods x 5  STM to cervical spine to decrease pain and increase tissue extensibility x 10' no other treatment performed during manual treatment  Upper trap stretch 5 x 10" each   Supine: Cervical retractions x 10 Scapular retractions x 10 RTB shoulder horizontal abduction x 10       07/10/22 Review of HEP and goals Sitting  Smooth pursuit horizontal and vertical x 5 each Manual  STW to cervical spine to decrease pain and increase tissue extensibility x 10' no other treatment performed during manual treatment  Supine: Cervical retractions x 10 Scapular retractions x 10    06/15/22 Eval  Canalith Repositioning:  Epley Right: Number of Reps: 1 and Response to Treatment: comment: (-) and Epley  Left: Comment: (-) bilateral   PATIENT EDUCATION: Education details: On Eval findings, POC and HEP  Person educated: Patient Education method: Explanation Education comprehension: verbalized understanding  HOME EXERCISE PROGRAM: 07/12/22 cervical retractions and scapular retractions in supine Access Code: [email or web address] URL: [email or web address] Date: 06/15/2022 Prepared by: Georges Coletta Lockner  Exercises - Seated Horizontal Smooth Pursuit  - 3 x daily - 7 x weekly - 3 sets - 10 reps - Seated Vertical Smooth Pursuit  - 3 x daily - 7 x weekly - 3 sets - 10 reps  GOALS:  SHORT TERM GOALS: Target date: 06/29/2022  Patient will be independent with initial HEP and self-management strategies to improve functional outcomes Baseline:  Goal status: MET     LONG TERM GOALS: Target date: 07/13/2022  Patient will be independent with advanced HEP and self-management strategies to improve functional outcomes Baseline:  Goal status: IN PROGRESS  2.  Patient will reports at least 50% improvement in subjective complaint of vertigo to indicate improvement in functional outcomes Baseline:  Goal status: MET  3.  Patient will report reduction of neck/jaw pain to <3/10 for improved quality of life and ability to perform ADLs  Baseline: 9/10 Goal status: IN PROGRESS    ASSESSMENT:  CLINICAL IMPRESSION: Progress note today.   Improving cervical mobility noted; pain seems to mostly be in TMJ area right > left. Does have radiating pain into the right jaw area with trigger point  compression.   No symptoms of vertigo today.  Patient will benefit from continued skilled  therapy services to address deficits and promote return to optimal function.     Eval:Patient is a 62 y.o. female who presents to physical therapy with complaint of vertigo. Patient demonstrates increased vestibular symptoms with provocative testing which is negatively impacting patient ability to perform ADLs and functional mobility tasks. Patient will benefit from skilled physical therapy services to address these deficits to improve level of function with ADLs, functional mobility tasks, and reduce risk for falls.    OBJECTIVE IMPAIRMENTS: Abnormal gait, decreased balance, decreased mobility, decreased ROM, dizziness, hypomobility, impaired flexibility, improper body mechanics, postural dysfunction, and pain.   ACTIVITY LIMITATIONS: lifting, bending, transfers, and locomotion level  PARTICIPATION LIMITATIONS: cleaning, laundry, shopping, and community activity  PERSONAL FACTORS: Behavior pattern and Time since onset of injury/illness/exacerbation are also affecting patient's functional outcome.   REHAB POTENTIAL: Good  CLINICAL DECISION MAKING: Stable/uncomplicated  EVALUATION COMPLEXITY: Low   PLAN:  PT FREQUENCY: 1-2x/week  PT DURATION: 4 weeks  PLANNED INTERVENTIONS: Therapeutic exercises, Therapeutic activity, Neuromuscular re-education, Balance training, Gait training, Patient/Family education, Self Care, Joint mobilization, Vestibular training, and Canalith repositioning Therapeutic exercises, Therapeutic activity, Neuromuscular re-education, Balance training, Gait training, Patient/Family education, Joint manipulation, Joint mobilization, Stair training, Aquatic Therapy, Dry Needling, Electrical stimulation, Spinal manipulation, Spinal mobilization, Cryotherapy, Moist heat, scar mobilization, Taping, Traction, Ultrasound, Biofeedback, Ionotophoresis 4mg /ml Dexamethasone, and Manual therapy.   PLAN FOR NEXT SESSION: Progress habituation and neck ROM exercise/ stretching as  tolerated. Standing balance and dynamic challenge when ready.   8:15 AM, 07/19/22 Shalayah Beagley Small Kenai Fluegel MPT [place] [facility] [place] [phone]

## 2022-07-26 ENCOUNTER — Ambulatory Visit (HOSPITAL_COMMUNITY): Payer: Self-pay

## 2022-07-26 ENCOUNTER — Other Ambulatory Visit: Payer: Self-pay | Admitting: Physician Assistant

## 2022-07-26 DIAGNOSIS — M542 Cervicalgia: Secondary | ICD-10-CM

## 2022-07-26 DIAGNOSIS — R42 Dizziness and giddiness: Secondary | ICD-10-CM

## 2022-07-26 NOTE — Therapy (Signed)
OUTPATIENT PHYSICAL THERAPY VESTIBULAR TREATMENT       Patient Name: Amanda Zamora MRN: [id number] DOB:1960-08-20, 62 y.o., female Today's Date: 07/26/2022  END OF SESSION:  PT End of Session - 07/26/22 0735     Visit Number 4    Number of Visits 8    Date for PT Re-Evaluation 08/18/22    Authorization Type None    PT Start Time 0730    PT Stop Time 0810    PT Time Calculation (min) 40 min    Activity Tolerance Patient tolerated treatment well    Behavior During Therapy [facility] for tasks assessed/performed             Past Medical History:  Diagnosis Date   Detached retina    GERD (gastroesophageal reflux disease)    History of pyelonephritis    PONV (postoperative nausea and vomiting)    Type 2 diabetes mellitus (HCC)    Past Surgical History:  Procedure Laterality Date   CHOLECYSTECTOMY N/A 12/23/2019   Procedure: LAPAROSCOPIC CHOLECYSTECTOMY;  Surgeon: Lucretia Roers, MD;  Location: [facility];  Service: General;  Laterality: N/A;   COLONOSCOPY WITH PROPOFOL N/A 02/01/2022   Procedure: COLONOSCOPY WITH PROPOFOL;  Surgeon: Lanelle Bal, DO;  Location: [facility];  Service: Endoscopy;  Laterality: N/A;  10:00 am   DENTAL SURGERY  06/01/2021   Removed 5 teeth   EYE SURGERY Right    retina   LAPAROSCOPIC APPENDECTOMY N/A 07/05/2020   Procedure: APPENDECTOMY LAPAROSCOPIC;  Surgeon: Franky Macho, MD;  Location: [facility];  Service: General;  Laterality: N/A;   POLYPECTOMY  02/01/2022   Procedure: POLYPECTOMY;  Surgeon: Lanelle Bal, DO;  Location: [facility];  Service: Endoscopy;;   TUBAL LIGATION     Patient Active Problem List   Diagnosis Date Noted   Abnormal CT scan, colon 01/03/2022   Constipation 01/03/2022   Vomiting 07/05/2020   Hyperglycemia due to diabetes mellitus (HCC) 07/05/2020   Acute appendicitis 07/04/2020   Abdominal pain 04/01/2020   Leukocytosis 04/01/2020   AKI (acute kidney injury) (HCC) 04/01/2020   Hypercalcemia  04/01/2020   Hypoglycemia 03/31/2020   Chronic cholecystitis 12/19/2019   Abdominal pain, right upper quadrant    Abnormal mammogram 10/10/2018   Type 2 diabetes mellitus with diabetic neuropathy, unspecified (HCC) 08/01/2018   Not proficient in English language 08/01/2018   Screening for colon cancer 08/01/2018   Hyperlipidemia 03/10/2015   HTN (hypertension) 03/10/2015   Obesity, unspecified 03/10/2015   GERD (gastroesophageal reflux disease) 03/10/2015   Precordial pain 03/28/2013   Type 2 diabetes mellitus (HCC) 03/28/2013    PCP: Jacquelin Hawking PA REFERRING PROVIDER: Tempie Hoist, MD  REFERRING DIAG: H81.13 (ICD-10-CM) - Benign paroxysmal vertigo, bilateral  THERAPY DIAG:  Neck pain  Dizziness and giddiness  ONSET DATE: 7 months ago   Rationale for Evaluation and Treatment: Rehabilitation  SUBJECTIVE:   SUBJECTIVE STATEMENT: Much better overall; 3/10 pain today; states she feels she is getting better every day  Eval:Patient presents to therapy with complaint of insidious onset of vertigo 7 months ago. She says one Saturday she woke up and noticed she gets dizzy when she bends over. She went to the hospital and was diagnosed with vertigo.   Pt accompanied by: interpreter: Lissa Hoard   PERTINENT HISTORY: NA  PAIN:  Are you having pain? Yes: NPRS scale: 9/10 Pain location: RT jaw Pain description: aching Aggravating factors: constant pain, bending over  Relieving factors: nothing   PRECAUTIONS: None  WEIGHT BEARING RESTRICTIONS: No  FALLS: Has patient fallen in last 6 months? No  LIVING ENVIRONMENT: Lives with: lives with their family Lives in: House/apartment  PLOF: Independent with basic ADLs  PATIENT GOALS: Heel, feel better   OBJECTIVE:   DIAGNOSTIC FINDINGS: IMPRESSION: No acute finding.    COGNITION: Overall cognitive status: Within functional limits for tasks assessed  Cervical ROM:  some discomfort at end ranges  Active A/PROM  (deg) eval AROM 07/19/22 degrees  Flexion 25% limited 29 degrees  Extension 75% limited 50 degrees  Right lateral flexion 75% limited 28  Left lateral flexion 75% limited 34  Right rotation 25% limited 59  Left rotation 25% limited 61  (Blank rows = not tested)   VESTIBULAR ASSESSMENT:  OCULOMOTOR EXAM:  Ocular Alignment: normal  Ocular ROM: No Limitations  Spontaneous Nystagmus: absent  Gaze-Induced Nystagmus: absent  Smooth Pursuits:  difficulty tracking vertically     MOTION SENSITIVITY:  Motion Sensitivity Quotient Intensity: 0 = none, 1 = Lightheaded, 2 = Mild, 3 = Moderate, 4 = Severe, 5 = Vomiting  Intensity 07/19/22  1. Sitting to supine 1   2. Supine to L side 0   3. Supine to R side 0   4. Supine to sitting    5. L Hallpike-Dix 1   6. Up from L  3   7. R Hallpike-Dix 1   8. Up from R  3   9. Sitting, head tipped to L knee    10. Head up from L knee    11. Sitting, head tipped to R knee    12. Head up from R knee    13. Sitting head turns x5 2 0 "pulling" in cervical spine  14.Sitting head nods x5 3 0 "pulling" in cervical spine  15. In stance, 180 turn to L     16. In stance, 180 turn to R      VESTIBULAR TREATMENT:                                                                                                   DATE:  07/26/22 UBE backwards x 3'  Standing: RTB scapular retractions 2 x 10 RTB shoulder extensions 2 x 10  Sitting: AROM cervical flexion and extension; rotation, sidebending all x 1' each Cervical retractions x 10 STM to cervical spine to decrease pain and increase tissue extensibility x 10' no other treatment performed during manual treatment RTB bilateral shoulder external rotation W back 2 x 10    07/19/22 Progress note AROM of cervical spine see above Sitting: Head turns x 5 Head nods x 5  STM to cervical spine to decrease pain and increase tissue extensibility x 10' no other treatment performed during manual treatment  Upper  trap stretch 5 x 10" each   Supine: Cervical retractions x 10 Scapular retractions x 10 RTB shoulder horizontal abduction x 10       07/10/22 Review of HEP and goals Sitting  Smooth pursuit horizontal and vertical x 5 each Manual STW to cervical spine to decrease pain and increase tissue extensibility  x 10' no other treatment performed during manual treatment  Supine: Cervical retractions x 10 Scapular retractions x 10    06/15/22 Eval  Canalith Repositioning:  Epley Right: Number of Reps: 1 and Response to Treatment: comment: (-) and Epley Left: Comment: (-) bilateral   PATIENT EDUCATION: Education details: On Eval findings, POC and HEP  Person educated: Patient Education method: Explanation Education comprehension: verbalized understanding  HOME EXERCISE PROGRAM: 07/26/22 shoulder external rotation, scapular retraction and shoulder ext with theraband,  07/12/22 cervical retractions and scapular retractions in supine Access Code: [email or web address] URL: [email or web address] Date: 06/15/2022 Prepared by: Georges Avereigh Spainhower  Exercises - Seated Horizontal Smooth Pursuit  - 3 x daily - 7 x weekly - 3 sets - 10 reps - Seated Vertical Smooth Pursuit  - 3 x daily - 7 x weekly - 3 sets - 10 reps  GOALS:  SHORT TERM GOALS: Target date: 06/29/2022  Patient will be independent with initial HEP and self-management strategies to improve functional outcomes Baseline:  Goal status: MET     LONG TERM GOALS: Target date: 07/13/2022  Patient will be independent with advanced HEP and self-management strategies to improve functional outcomes Baseline:  Goal status: IN PROGRESS  2.  Patient will reports at least 50% improvement in subjective complaint of vertigo to indicate improvement in functional outcomes Baseline:  Goal status: MET  3.  Patient will report reduction of neck/jaw pain to <3/10 for improved quality of life and ability to perform ADLs  Baseline:  9/10 Goal status: IN PROGRESS    ASSESSMENT:  CLINICAL IMPRESSION: No reports of dizziness today; declining pain levels. Increased postural exercise and strengthening today; good challenge especially noted with W back; patient with weak shoulder external rotators; tight internal rotators.  Updated HEP  Patient will benefit from continued skilled therapy services to address deficits and promote return to optimal function.     Eval:Patient is a 62 y.o. female who presents to physical therapy with complaint of vertigo. Patient demonstrates increased vestibular symptoms with provocative testing which is negatively impacting patient ability to perform ADLs and functional mobility tasks. Patient will benefit from skilled physical therapy services to address these deficits to improve level of function with ADLs, functional mobility tasks, and reduce risk for falls.    OBJECTIVE IMPAIRMENTS: Abnormal gait, decreased balance, decreased mobility, decreased ROM, dizziness, hypomobility, impaired flexibility, improper body mechanics, postural dysfunction, and pain.   ACTIVITY LIMITATIONS: lifting, bending, transfers, and locomotion level  PARTICIPATION LIMITATIONS: cleaning, laundry, shopping, and community activity  PERSONAL FACTORS: Behavior pattern and Time since onset of injury/illness/exacerbation are also affecting patient's functional outcome.   REHAB POTENTIAL: Good  CLINICAL DECISION MAKING: Stable/uncomplicated  EVALUATION COMPLEXITY: Low   PLAN:  PT FREQUENCY: 1-2x/week  PT DURATION: 4 weeks  PLANNED INTERVENTIONS: Therapeutic exercises, Therapeutic activity, Neuromuscular re-education, Balance training, Gait training, Patient/Family education, Self Care, Joint mobilization, Vestibular training, and Canalith repositioning Therapeutic exercises, Therapeutic activity, Neuromuscular re-education, Balance training, Gait training, Patient/Family education, Joint manipulation, Joint  mobilization, Stair training, Aquatic Therapy, Dry Needling, Electrical stimulation, Spinal manipulation, Spinal mobilization, Cryotherapy, Moist heat, scar mobilization, Taping, Traction, Ultrasound, Biofeedback, Ionotophoresis 4mg /ml Dexamethasone, and Manual therapy.   PLAN FOR NEXT SESSION: add doorway stretch/corner stretch  8:16 AM, 07/26/22 Mirissa Lopresti Small Trason Shifflet MPT [facility] [place] [phone]

## 2022-07-28 ENCOUNTER — Encounter (HOSPITAL_COMMUNITY): Payer: Self-pay

## 2022-08-05 ENCOUNTER — Ambulatory Visit (HOSPITAL_COMMUNITY): Payer: HRSA Program | Attending: Physician Assistant

## 2022-08-05 DIAGNOSIS — R42 Dizziness and giddiness: Secondary | ICD-10-CM | POA: Insufficient documentation

## 2022-08-05 DIAGNOSIS — M542 Cervicalgia: Secondary | ICD-10-CM | POA: Insufficient documentation

## 2022-08-05 NOTE — Therapy (Signed)
OUTPATIENT PHYSICAL THERAPY VESTIBULAR TREATMENT       Patient Name: Amanda Zamora MRN: [id number] DOB:05-11-1960, 62 y.o., female Today's Date: 08/05/2022  END OF SESSION:  PT End of Session - 08/05/22 0904     Visit Number 5    Number of Visits 8    Date for PT Re-Evaluation 08/18/22    Authorization Type None    PT Start Time 0904    PT Stop Time 0945    PT Time Calculation (min) 41 min    Activity Tolerance Patient tolerated treatment well    Behavior During Therapy [facility] for tasks assessed/performed             Past Medical History:  Diagnosis Date   Detached retina    GERD (gastroesophageal reflux disease)    History of pyelonephritis    PONV (postoperative nausea and vomiting)    Type 2 diabetes mellitus (HCC)    Past Surgical History:  Procedure Laterality Date   CHOLECYSTECTOMY N/A 12/23/2019   Procedure: LAPAROSCOPIC CHOLECYSTECTOMY;  Surgeon: Lucretia Roers, MD;  Location: [facility];  Service: General;  Laterality: N/A;   COLONOSCOPY WITH PROPOFOL N/A 02/01/2022   Procedure: COLONOSCOPY WITH PROPOFOL;  Surgeon: Lanelle Bal, DO;  [facility];  Service: Endoscopy;  Laterality: N/A;  10:00 am   DENTAL SURGERY  06/01/2021   Removed 5 teeth   EYE SURGERY Right    retina   LAPAROSCOPIC APPENDECTOMY N/A 07/05/2020   Procedure: APPENDECTOMY LAPAROSCOPIC;  Surgeon: Franky Macho, MD;  Location: [facility];  Service: General;  Laterality: N/A;   POLYPECTOMY  02/01/2022   Procedure: POLYPECTOMY;  Surgeon: Lanelle Bal, DO;  [facility];  Service: Endoscopy;;   TUBAL LIGATION     Patient Active Problem List   Diagnosis Date Noted   Abnormal CT scan, colon 01/03/2022   Constipation 01/03/2022   Vomiting 07/05/2020   Hyperglycemia due to diabetes mellitus (HCC) 07/05/2020   Acute appendicitis 07/04/2020   Abdominal pain 04/01/2020   Leukocytosis 04/01/2020   AKI (acute kidney injury) (HCC) 04/01/2020   Hypercalcemia  04/01/2020   Hypoglycemia 03/31/2020   Chronic cholecystitis 12/19/2019   Abdominal pain, right upper quadrant    Abnormal mammogram 10/10/2018   Type 2 diabetes mellitus with diabetic neuropathy, unspecified (HCC) 08/01/2018   Not proficient in English language 08/01/2018   Screening for colon cancer 08/01/2018   Hyperlipidemia 03/10/2015   HTN (hypertension) 03/10/2015   Obesity, unspecified 03/10/2015   GERD (gastroesophageal reflux disease) 03/10/2015   Precordial pain 03/28/2013   Type 2 diabetes mellitus (HCC) 03/28/2013    PCP: Jacquelin Hawking PA REFERRING PROVIDER: Tempie Hoist, MD  REFERRING DIAG: H81.13 (ICD-10-CM) - Benign paroxysmal vertigo, bilateral  THERAPY DIAG:  Neck pain  Dizziness and giddiness  ONSET DATE: 7 months ago   Rationale for Evaluation and Treatment: Rehabilitation  SUBJECTIVE:   SUBJECTIVE STATEMENT: 4/10 pain today in neck; stiff but overall getting better  Eval:Patient presents to therapy with complaint of insidious onset of vertigo 7 months ago. She says one Saturday she woke up and noticed she gets dizzy when she bends over. She went to the hospital and was diagnosed with vertigo.   Pt accompanied by: interpreter: Lissa Hoard   PERTINENT HISTORY: NA  PAIN:  Are you having pain? Yes: NPRS scale: 9/10 Pain location: RT jaw Pain description: aching Aggravating factors: constant pain, bending over  Relieving factors: nothing   PRECAUTIONS: None  WEIGHT BEARING RESTRICTIONS: No  FALLS: Has patient fallen in last 6 months? No  LIVING ENVIRONMENT: Lives with: lives with their family Lives in: House/apartment  PLOF: Independent with basic ADLs  PATIENT GOALS: Heel, feel better   OBJECTIVE:   DIAGNOSTIC FINDINGS: IMPRESSION: No acute finding.    COGNITION: Overall cognitive status: Within functional limits for tasks assessed  Cervical ROM:  some discomfort at end ranges  Active A/PROM (deg) eval AROM 07/19/22 degrees   Flexion 25% limited 29 degrees  Extension 75% limited 50 degrees  Right lateral flexion 75% limited 28  Left lateral flexion 75% limited 34  Right rotation 25% limited 59  Left rotation 25% limited 61  (Blank rows = not tested)   VESTIBULAR ASSESSMENT:  OCULOMOTOR EXAM:  Ocular Alignment: normal  Ocular ROM: No Limitations  Spontaneous Nystagmus: absent  Gaze-Induced Nystagmus: absent  Smooth Pursuits:  difficulty tracking vertically     MOTION SENSITIVITY:  Motion Sensitivity Quotient Intensity: 0 = none, 1 = Lightheaded, 2 = Mild, 3 = Moderate, 4 = Severe, 5 = Vomiting  Intensity 07/19/22  1. Sitting to supine 1   2. Supine to L side 0   3. Supine to R side 0   4. Supine to sitting    5. L Hallpike-Dix 1   6. Up from L  3   7. R Hallpike-Dix 1   8. Up from R  3   9. Sitting, head tipped to L knee    10. Head up from L knee    11. Sitting, head tipped to R knee    12. Head up from R knee    13. Sitting head turns x5 2 0 "pulling" in cervical spine  14.Sitting head nods x5 3 0 "pulling" in cervical spine  15. In stance, 180 turn to L     16. In stance, 180 turn to R      VESTIBULAR TREATMENT:                                                                                                   DATE:  08/05/22 UBE backwards x 4'  Standing: GTB scapular retractions 2 x 10 GTB shoulder extensions 2 x 10 Pallof press GTB 2 x 10 each Doorway stretch 10" x 10  Seated Cervical retractions x 10 Cervical retraction with extensions x 10 2# W back x 10  STM to cervical spine to decrease pain and increase tissue extensibility x 10' no other treatment performed during manual treatment    07/26/22 UBE backwards x 3'  Standing: RTB scapular retractions 2 x 10 RTB shoulder extensions 2 x 10  Sitting: AROM cervical flexion and extension; rotation, sidebending all x 1' each Cervical retractions x 10 STM to cervical spine to decrease pain and increase tissue  extensibility x 10' no other treatment performed during manual treatment RTB bilateral shoulder external rotation W back 2 x 10    07/19/22 Progress note AROM of cervical spine see above Sitting: Head turns x 5 Head nods x 5  STM to cervical spine to decrease pain and increase tissue extensibility x  10' no other treatment performed during manual treatment  Upper trap stretch 5 x 10" each   Supine: Cervical retractions x 10 Scapular retractions x 10 RTB shoulder horizontal abduction x 10       07/10/22 Review of HEP and goals Sitting  Smooth pursuit horizontal and vertical x 5 each Manual STW to cervical spine to decrease pain and increase tissue extensibility x 10' no other treatment performed during manual treatment  Supine: Cervical retractions x 10 Scapular retractions x 10    06/15/22 Eval  Canalith Repositioning:  Epley Right: Number of Reps: 1 and Response to Treatment: comment: (-) and Epley Left: Comment: (-) bilateral   PATIENT EDUCATION: Education details: On Eval findings, POC and HEP  Person educated: Patient Education method: Explanation Education comprehension: verbalized understanding  HOME EXERCISE PROGRAM: 08/05/22 doorway stretch, palloff press, cervical retraction with extension 07/26/22 shoulder external rotation, scapular retraction and shoulder ext with theraband,  07/12/22 cervical retractions and scapular retractions in supine Access Code: [email or web address] URL: [email or web address] Date: 06/15/2022 Prepared by: Georges Anaih Brander  Exercises - Seated Horizontal Smooth Pursuit  - 3 x daily - 7 x weekly - 3 sets - 10 reps - Seated Vertical Smooth Pursuit  - 3 x daily - 7 x weekly - 3 sets - 10 reps  GOALS:  SHORT TERM GOALS: Target date: 06/29/2022  Patient will be independent with initial HEP and self-management strategies to improve functional outcomes Baseline:  Goal status: MET     LONG TERM GOALS: Target date:  07/13/2022  Patient will be independent with advanced HEP and self-management strategies to improve functional outcomes Baseline:  Goal status: IN PROGRESS  2.  Patient will reports at least 50% improvement in subjective complaint of vertigo to indicate improvement in functional outcomes Baseline:  Goal status: MET  3.  Patient will report reduction of neck/jaw pain to <3/10 for improved quality of life and ability to perform ADLs  Baseline: 9/10 Goal status: IN PROGRESS    ASSESSMENT:  CLINICAL IMPRESSION: No reports of dizziness today; progressed to green theraband with good challenge; needs cues for posturing with standing exercise; added palloff press for core challenge; added doorway stretch as patient is tight anterior chest and shoulder area.  Updated HEP  Patient will benefit from continued skilled therapy services to address deficits and promote return to optimal function.     Eval:Patient is a 62 y.o. female who presents to physical therapy with complaint of vertigo. Patient demonstrates increased vestibular symptoms with provocative testing which is negatively impacting patient ability to perform ADLs and functional mobility tasks. Patient will benefit from skilled physical therapy services to address these deficits to improve level of function with ADLs, functional mobility tasks, and reduce risk for falls.    OBJECTIVE IMPAIRMENTS: Abnormal gait, decreased balance, decreased mobility, decreased ROM, dizziness, hypomobility, impaired flexibility, improper body mechanics, postural dysfunction, and pain.   ACTIVITY LIMITATIONS: lifting, bending, transfers, and locomotion level  PARTICIPATION LIMITATIONS: cleaning, laundry, shopping, and community activity  PERSONAL FACTORS: Behavior pattern and Time since onset of injury/illness/exacerbation are also affecting patient's functional outcome.   REHAB POTENTIAL: Good  CLINICAL DECISION MAKING:  Stable/uncomplicated  EVALUATION COMPLEXITY: Low   PLAN:  PT FREQUENCY: 1-2x/week  PT DURATION: 4 weeks  PLANNED INTERVENTIONS: Therapeutic exercises, Therapeutic activity, Neuromuscular re-education, Balance training, Gait training, Patient/Family education, Self Care, Joint mobilization, Vestibular training, and Canalith repositioning Therapeutic exercises, Therapeutic activity, Neuromuscular re-education, Balance training, Gait training, Patient/Family education, Joint manipulation, Joint mobilization, Stair training, Aquatic  Therapy, Dry Needling, Electrical stimulation, Spinal manipulation, Spinal mobilization, Cryotherapy, Moist heat, scar mobilization, Taping, Traction, Ultrasound, Biofeedback, Ionotophoresis 4mg /ml Dexamethasone, and Manual therapy.   PLAN FOR NEXT SESSION: reassess  9:50 AM, 08/05/22 Reford Olliff Small Haelee Bolen MPT [facility] [place] [phone]

## 2022-08-08 ENCOUNTER — Other Ambulatory Visit: Payer: Self-pay | Admitting: Physician Assistant

## 2022-08-11 ENCOUNTER — Encounter (HOSPITAL_COMMUNITY): Payer: Self-pay | Admitting: Physical Therapy

## 2022-08-16 ENCOUNTER — Ambulatory Visit (HOSPITAL_COMMUNITY): Payer: Self-pay | Attending: Otology & Neurotology

## 2022-08-16 DIAGNOSIS — R42 Dizziness and giddiness: Secondary | ICD-10-CM | POA: Insufficient documentation

## 2022-08-16 DIAGNOSIS — M542 Cervicalgia: Secondary | ICD-10-CM | POA: Insufficient documentation

## 2022-08-16 NOTE — Therapy (Signed)
OUTPATIENT PHYSICAL THERAPY VESTIBULAR DISCHARGE  PHYSICAL THERAPY DISCHARGE SUMMARY  Visits from Start of Care: 6  Current functional level related to goals / functional outcomes: See below   Remaining deficits: See below   Education / Equipment: See below    Patient agrees to discharge. Patient goals were met. Patient is being discharged due to meeting the stated rehab goals.        Patient Name: Amanda Zamora MRN: [id number] DOB:October 29, 1960, 62 y.o., female Today's Date: 08/16/2022  END OF SESSION:  PT End of Session - 08/16/22 0738     Visit Number 6    Number of Visits 8    Date for PT Re-Evaluation 08/18/22    Authorization Type None    PT Start Time 0733    PT Stop Time 0813    PT Time Calculation (min) 40 min    Activity Tolerance Patient tolerated treatment well    Behavior During Therapy [facility] for tasks assessed/performed             Past Medical History:  Diagnosis Date   Detached retina    GERD (gastroesophageal reflux disease)    History of pyelonephritis    PONV (postoperative nausea and vomiting)    Type 2 diabetes mellitus (HCC)    Past Surgical History:  Procedure Laterality Date   CHOLECYSTECTOMY N/A 12/23/2019   Procedure: LAPAROSCOPIC CHOLECYSTECTOMY;  Surgeon: Lucretia Roers, MD;  Location: [facility];  Service: General;  Laterality: N/A;   COLONOSCOPY WITH PROPOFOL N/A 02/01/2022   Procedure: COLONOSCOPY WITH PROPOFOL;  Surgeon: Lanelle Bal, DO;  Location: [facility];  Service: Endoscopy;  Laterality: N/A;  10:00 am   DENTAL SURGERY  06/01/2021   Removed 5 teeth   EYE SURGERY Right    retina   LAPAROSCOPIC APPENDECTOMY N/A 07/05/2020   Procedure: APPENDECTOMY LAPAROSCOPIC;  Surgeon: Franky Macho, MD;  Location: [facility];  Service: General;  Laterality: N/A;   POLYPECTOMY  02/01/2022   Procedure: POLYPECTOMY;  Surgeon: Lanelle Bal, DO;  Location: [facility];  Service: Endoscopy;;   TUBAL LIGATION     Patient  Active Problem List   Diagnosis Date Noted   Abnormal CT scan, colon 01/03/2022   Constipation 01/03/2022   Vomiting 07/05/2020   Hyperglycemia due to diabetes mellitus (HCC) 07/05/2020   Acute appendicitis 07/04/2020   Abdominal pain 04/01/2020   Leukocytosis 04/01/2020   AKI (acute kidney injury) (HCC) 04/01/2020   Hypercalcemia 04/01/2020   Hypoglycemia 03/31/2020   Chronic cholecystitis 12/19/2019   Abdominal pain, right upper quadrant    Abnormal mammogram 10/10/2018   Type 2 diabetes mellitus with diabetic neuropathy, unspecified (HCC) 08/01/2018   Not proficient in English language 08/01/2018   Screening for colon cancer 08/01/2018   Hyperlipidemia 03/10/2015   HTN (hypertension) 03/10/2015   Obesity, unspecified 03/10/2015   GERD (gastroesophageal reflux disease) 03/10/2015   Precordial pain 03/28/2013   Type 2 diabetes mellitus (HCC) 03/28/2013    PCP: Jacquelin Hawking PA REFERRING PROVIDER: Tempie Hoist, MD  REFERRING DIAG: H81.13 (ICD-10-CM) - Benign paroxysmal vertigo, bilateral  THERAPY DIAG:  Neck pain  Dizziness and giddiness  ONSET DATE: 7 months ago   Rationale for Evaluation and Treatment: Rehabilitation  SUBJECTIVE:   SUBJECTIVE STATEMENT: 3/10 pain today in neck; stiff but overall 90% better  Eval:Patient presents to therapy with complaint of insidious onset of vertigo 7 months ago. She says one Saturday she woke up and noticed she gets dizzy when she bends over.  She went to the hospital and was diagnosed with vertigo.   Pt accompanied by: interpreter: Lissa Hoard   PERTINENT HISTORY: NA  PAIN:  Are you having pain? Yes: NPRS scale: 9/10 Pain location: RT jaw Pain description: aching Aggravating factors: constant pain, bending over  Relieving factors: nothing   PRECAUTIONS: None  WEIGHT BEARING RESTRICTIONS: No  FALLS: Has patient fallen in last 6 months? No  LIVING ENVIRONMENT: Lives with: lives with their family Lives in:  House/apartment  PLOF: Independent with basic ADLs  PATIENT GOALS: Heel, feel better   OBJECTIVE:   DIAGNOSTIC FINDINGS: IMPRESSION: No acute finding.    COGNITION: Overall cognitive status: Within functional limits for tasks assessed  Cervical ROM:  some discomfort at end ranges  Active A/PROM (deg) eval AROM 07/19/22 degrees AROM 08/16/22  Flexion 25% limited 29 degrees 38  Extension 75% limited 50 degrees 50  Right lateral flexion 75% limited 28 38  Left lateral flexion 75% limited 34 36  Right rotation 25% limited 59 62  Left rotation 25% limited 61 65  (Blank rows = not tested)   VESTIBULAR ASSESSMENT:  OCULOMOTOR EXAM:  Ocular Alignment: normal  Ocular ROM: No Limitations  Spontaneous Nystagmus: absent  Gaze-Induced Nystagmus: absent  Smooth Pursuits:  difficulty tracking vertically     MOTION SENSITIVITY:  Motion Sensitivity Quotient Intensity: 0 = none, 1 = Lightheaded, 2 = Mild, 3 = Moderate, 4 = Severe, 5 = Vomiting  Intensity 07/19/22  1. Sitting to supine 1   2. Supine to L side 0   3. Supine to R side 0   4. Supine to sitting    5. L Hallpike-Dix 1   6. Up from L  3   7. R Hallpike-Dix 1   8. Up from R  3   9. Sitting, head tipped to L knee    10. Head up from L knee    11. Sitting, head tipped to R knee    12. Head up from R knee    13. Sitting head turns x5 2 0 "pulling" in cervical spine  14.Sitting head nods x5 3 0 "pulling" in cervical spine  15. In stance, 180 turn to L     16. In stance, 180 turn to R      VESTIBULAR TREATMENT:                                                                                                   DATE:  08/16/22 Progress note AROM of Cervical spine see above Cervical retractions x 10 Cervical turns and nods x 10 each Review of HEP  STM to cervical spine to decrease pain and increase tissue extensibility x 10' no other treatment performed during manual treatment   08/05/22 UBE backwards x  4'  Standing: GTB scapular retractions 2 x 10 GTB shoulder extensions 2 x 10 Pallof press GTB 2 x 10 each Doorway stretch 10" x 10  Seated Cervical retractions x 10 Cervical retraction with extensions x 10 2# W back x 10  STM to cervical spine to decrease pain and increase  tissue extensibility x 10' no other treatment performed during manual treatment    07/26/22 UBE backwards x 3'  Standing: RTB scapular retractions 2 x 10 RTB shoulder extensions 2 x 10  Sitting: AROM cervical flexion and extension; rotation, sidebending all x 1' each Cervical retractions x 10 STM to cervical spine to decrease pain and increase tissue extensibility x 10' no other treatment performed during manual treatment RTB bilateral shoulder external rotation W back 2 x 10    07/19/22 Progress note AROM of cervical spine see above Sitting: Head turns x 5 Head nods x 5  STM to cervical spine to decrease pain and increase tissue extensibility x 10' no other treatment performed during manual treatment  Upper trap stretch 5 x 10" each   Supine: Cervical retractions x 10 Scapular retractions x 10 RTB shoulder horizontal abduction x 10       07/10/22 Review of HEP and goals Sitting  Smooth pursuit horizontal and vertical x 5 each Manual STW to cervical spine to decrease pain and increase tissue extensibility x 10' no other treatment performed during manual treatment  Supine: Cervical retractions x 10 Scapular retractions x 10    06/15/22 Eval  Canalith Repositioning:  Epley Right: Number of Reps: 1 and Response to Treatment: comment: (-) and Epley Left: Comment: (-) bilateral   PATIENT EDUCATION: Education details: On Eval findings, POC and HEP  Person educated: Patient Education method: Explanation Education comprehension: verbalized understanding  HOME EXERCISE PROGRAM: 08/05/22 doorway stretch, palloff press, cervical retraction with extension 07/26/22 shoulder external  rotation, scapular retraction and shoulder ext with theraband,  07/12/22 cervical retractions and scapular retractions in supine Access Code: [email or web address] URL: [email or web address] Date: 06/15/2022 Prepared by: Georges Edlin Ford  Exercises - Seated Horizontal Smooth Pursuit  - 3 x daily - 7 x weekly - 3 sets - 10 reps - Seated Vertical Smooth Pursuit  - 3 x daily - 7 x weekly - 3 sets - 10 reps  GOALS:  SHORT TERM GOALS: Target date: 06/29/2022  Patient will be independent with initial HEP and self-management strategies to improve functional outcomes Baseline:  Goal status: MET     LONG TERM GOALS: Target date: 07/13/2022  Patient will be independent with advanced HEP and self-management strategies to improve functional outcomes Baseline:  Goal status: MET  2.  Patient will reports at least 50% improvement in subjective complaint of vertigo to indicate improvement in functional outcomes Baseline:  Goal status: MET  3.  Patient will report reduction of neck/jaw pain to <3/10 for improved quality of life and ability to perform ADLs  Baseline: 9/10; 3/10 Goal status: MET    ASSESSMENT:  CLINICAL IMPRESSION: Progress note today; patient has met all set rehab goals and is agreeable to discharge at this time.  Discussed with patient following up with MD regarding continued TMJ type pain and she verbalizes understanding.     Eval:Patient is a 62 y.o. female who presents to physical therapy with complaint of vertigo. Patient demonstrates increased vestibular symptoms with provocative testing which is negatively impacting patient ability to perform ADLs and functional mobility tasks. Patient will benefit from skilled physical therapy services to address these deficits to improve level of function with ADLs, functional mobility tasks, and reduce risk for falls.    OBJECTIVE IMPAIRMENTS: Abnormal gait, decreased balance, decreased mobility, decreased ROM, dizziness,  hypomobility, impaired flexibility, improper body mechanics, postural dysfunction, and pain.   ACTIVITY LIMITATIONS: lifting, bending, transfers, and locomotion level  PARTICIPATION LIMITATIONS: cleaning, laundry,  shopping, and community activity  PERSONAL FACTORS: Behavior pattern and Time since onset of injury/illness/exacerbation are also affecting patient's functional outcome.   REHAB POTENTIAL: Good  CLINICAL DECISION MAKING: Stable/uncomplicated  EVALUATION COMPLEXITY: Low   PLAN:  PT FREQUENCY: 1-2x/week  PT DURATION: 4 weeks  PLANNED INTERVENTIONS: Therapeutic exercises, Therapeutic activity, Neuromuscular re-education, Balance training, Gait training, Patient/Family education, Self Care, Joint mobilization, Vestibular training, and Canalith repositioning Therapeutic exercises, Therapeutic activity, Neuromuscular re-education, Balance training, Gait training, Patient/Family education, Joint manipulation, Joint mobilization, Stair training, Aquatic Therapy, Dry Needling, Electrical stimulation, Spinal manipulation, Spinal mobilization, Cryotherapy, Moist heat, scar mobilization, Taping, Traction, Ultrasound, Biofeedback, Ionotophoresis 4mg /ml Dexamethasone, and Manual therapy.   PLAN FOR NEXT SESSION: discharge  8:14 AM, 08/16/22 Dianara Smullen Small Aizah Gehlhausen MPT [facility] [place] [phone]

## 2022-08-24 ENCOUNTER — Other Ambulatory Visit: Payer: Self-pay | Admitting: Physician Assistant

## 2022-08-25 ENCOUNTER — Telehealth: Payer: Self-pay

## 2022-08-25 NOTE — Telephone Encounter (Signed)
Telephoned patient at mobile number. Left a message with her daughter, Erin Sons (scholarship) contact information.

## 2022-09-13 ENCOUNTER — Other Ambulatory Visit: Payer: Self-pay | Admitting: Physician Assistant

## 2022-09-13 DIAGNOSIS — I10 Essential (primary) hypertension: Secondary | ICD-10-CM

## 2022-09-13 DIAGNOSIS — E785 Hyperlipidemia, unspecified: Secondary | ICD-10-CM

## 2022-09-13 DIAGNOSIS — E1165 Type 2 diabetes mellitus with hyperglycemia: Secondary | ICD-10-CM

## 2022-09-21 ENCOUNTER — Other Ambulatory Visit: Payer: Self-pay | Admitting: Physician Assistant

## 2022-09-23 ENCOUNTER — Ambulatory Visit (HOSPITAL_COMMUNITY)
Admission: RE | Admit: 2022-09-23 | Discharge: 2022-09-23 | Disposition: A | Discharge status: Home / Self Care | Payer: Self-pay | Source: Ambulatory Visit | Attending: Physician Assistant | Admitting: Physician Assistant

## 2022-09-23 DIAGNOSIS — Z1231 Encounter for screening mammogram for malignant neoplasm of breast: Secondary | ICD-10-CM | POA: Insufficient documentation

## 2022-10-04 ENCOUNTER — Other Ambulatory Visit (HOSPITAL_COMMUNITY)
Admission: RE | Admit: 2022-10-04 | Discharge: 2022-10-04 | Disposition: A | Discharge status: Home / Self Care | Payer: Self-pay | Source: Ambulatory Visit | Attending: Physician Assistant | Admitting: Physician Assistant

## 2022-10-04 DIAGNOSIS — E785 Hyperlipidemia, unspecified: Secondary | ICD-10-CM | POA: Insufficient documentation

## 2022-10-04 DIAGNOSIS — I10 Essential (primary) hypertension: Secondary | ICD-10-CM | POA: Insufficient documentation

## 2022-10-04 DIAGNOSIS — E1165 Type 2 diabetes mellitus with hyperglycemia: Secondary | ICD-10-CM | POA: Insufficient documentation

## 2022-10-04 LAB — LIPID PANEL
Cholesterol: 130 mg/dL (ref 0–200)
HDL: 33 mg/dL — ABNORMAL LOW (ref >40)
LDL Cholesterol: 46 mg/dL (ref 0–99)
Total CHOL/HDL Ratio: 3.9 RATIO
Triglycerides: 254 mg/dL — ABNORMAL HIGH (ref <150)
VLDL: 51 mg/dL — ABNORMAL HIGH (ref 0–40)

## 2022-10-04 LAB — COMPREHENSIVE METABOLIC PANEL
ALT: 19 U/L (ref 0–44)
AST: 19 U/L (ref 15–41)
Albumin: 3.9 g/dL (ref 3.5–5.0)
Alkaline Phosphatase: 75 U/L (ref 38–126)
Anion gap: 10 (ref 5–15)
BUN: 20 mg/dL (ref 8–23)
CO2: 26 mmol/L (ref 22–32)
Calcium: 9.1 mg/dL (ref 8.9–10.3)
Chloride: 99 mmol/L (ref 98–111)
Creatinine, Ser: 0.88 mg/dL (ref 0.44–1.00)
GFR, Estimated: >60 mL/min (ref >60)
Glucose, Bld: 142 mg/dL — ABNORMAL HIGH (ref 70–99)
Potassium: 4.3 mmol/L (ref 3.5–5.1)
Sodium: 135 mmol/L (ref 135–145)
Total Bilirubin: 0.8 mg/dL (ref 0.3–1.2)
Total Protein: 7.9 g/dL (ref 6.5–8.1)

## 2022-10-04 LAB — HEMOGLOBIN A1C
Hgb A1c MFr Bld: 7.8 % — ABNORMAL HIGH (ref 4.8–5.6)
Mean Plasma Glucose: 177.16 mg/dL

## 2022-10-05 ENCOUNTER — Other Ambulatory Visit (HOSPITAL_COMMUNITY)
Admission: RE | Admit: 2022-10-05 | Discharge: 2022-10-05 | Disposition: A | Discharge status: Home / Self Care | Payer: Self-pay | Source: Ambulatory Visit | Attending: Physician Assistant | Admitting: Physician Assistant

## 2022-10-05 ENCOUNTER — Ambulatory Visit: Payer: Self-pay | Admitting: Physician Assistant

## 2022-10-05 ENCOUNTER — Encounter: Payer: Self-pay | Admitting: Physician Assistant

## 2022-10-05 VITALS — BP 113/62 | HR 73 | Temp 97.7°F | Ht 60.5 in | Wt 163.5 lb

## 2022-10-05 DIAGNOSIS — E11319 Type 2 diabetes mellitus with unspecified diabetic retinopathy without macular edema: Secondary | ICD-10-CM

## 2022-10-05 DIAGNOSIS — Z01419 Encounter for gynecological examination (general) (routine) without abnormal findings: Secondary | ICD-10-CM | POA: Insufficient documentation

## 2022-10-05 DIAGNOSIS — I10 Essential (primary) hypertension: Secondary | ICD-10-CM

## 2022-10-05 DIAGNOSIS — Z789 Other specified health status: Secondary | ICD-10-CM

## 2022-10-05 DIAGNOSIS — E1165 Type 2 diabetes mellitus with hyperglycemia: Secondary | ICD-10-CM

## 2022-10-05 DIAGNOSIS — E785 Hyperlipidemia, unspecified: Secondary | ICD-10-CM

## 2022-10-05 NOTE — Patient Instructions (Signed)
LUNES 16 SEPTIEMBRE A LAS 3 PM [facility]  [address]  [address]  [place], [place] [address]  [phone]

## 2022-10-05 NOTE — Progress Notes (Signed)
BP 113/62   Pulse 73   Temp 97.7 F (36.5 C)   Ht 5' 0.5" (1.537 m)   Wt 163 lb 8 oz (74.2 kg)   LMP 02/15/2011 (Approximate)   SpO2 97%   BMI 31.41 kg/m    Subjective:    Patient ID: Amanda Zamora, female    DOB: 1960/08/14, 62 y.o.   MRN: [id number]  HPI: Amanda Zamora is a 62 y.o. female presenting on 10/05/2022 for Diabetes, Hypertension, Hyperlipidemia, and Gynecologic Exam   HPI  Chief Complaint  Patient presents with   Diabetes   Hypertension   Hyperlipidemia   Gynecologic Exam     Last seen eye dr 10/2021.  Due for 6 mo f/u but didn't go.  She didn't have transportation.    3 days of pain lower belly- hurts for about 5 minutes then goes away.  Feels like a pulse.  Occurs while just sitting around  BMs normal, urination normal.   She is no longer working.   CT abd/pelvis 11/24/2021- no AAA.    Relevant past medical, surgical, family and social history reviewed and updated as indicated. Interim medical history since our last visit reviewed. Allergies and medications reviewed and updated.   Current Outpatient Medications:    amLODipine (NORVASC) 10 MG tablet, TAKE 1 Tablet BY MOUTH ONCE EVERY DAY, Disp: 90 tablet, Rfl: 1   atorvastatin (LIPITOR) 80 MG tablet, TAKE 1 Tablet BY MOUTH ONCE EVERY NIGHT AT BEDTIME, Disp: 90 tablet, Rfl: 1   gabapentin (NEURONTIN) 300 MG capsule, Take 1 capsule by mouth at bedtime, Disp: 30 capsule, Rfl: 0   insulin NPH-regular Human (NOVOLIN 70/30 RELION) (70-30) 100 UNIT/ML injection, 20 units with breakfast and 20 units with supper if glucose is above 90 and she is eating., Disp: 30 mL, Rfl: 3   losartan (COZAAR) 100 MG tablet, TAKE 1 Tablet BY MOUTH ONCE EVERY DAY, Disp: 90 tablet, Rfl: 1   metFORMIN (GLUCOPHAGE) 1000 MG tablet, TAKE 1 Tablet  BY MOUTH TWICE DAILY WITH A MEAL, Disp: 180 tablet, Rfl: 1   multivitamin-iron-minerals-folic acid (CENTRUM) chewable tablet, Chew 1 tablet by mouth daily., Disp: , Rfl:    Omega-3  Fatty Acids (FISH OIL) 1000 MG CAPS, Take 1 capsule (1,000 mg total) by mouth in the morning and at bedtime. (Patient taking differently: Take 1 capsule by mouth at bedtime.), Disp: 60 capsule, Rfl: 2   omeprazole (PRILOSEC) 20 MG capsule, TAKE 1 Capsule BY MOUTH ONCE EVERY DAY, Disp: 90 capsule, Rfl: 1   cyclobenzaprine (FLEXERIL) 10 MG tablet, TAKE 1 TABLET BY MOUTH TWICE DAILY AS NEEDED FOR MUSCLE SPASM (Patient not taking: Reported on 03/29/2022), Disp: 20 tablet, Rfl: 0    Review of Systems  Per HPI unless specifically indicated above     Objective:    BP 113/62   Pulse 73   Temp 97.7 F (36.5 C)   Ht 5' 0.5" (1.537 m)   Wt 163 lb 8 oz (74.2 kg)   LMP 02/15/2011 (Approximate)   SpO2 97%   BMI 31.41 kg/m   Wt Readings from Last 3 Encounters:  10/05/22 163 lb 8 oz (74.2 kg)  07/05/22 164 lb (74.4 kg)  03/29/22 159 lb 12 oz (72.5 kg)    Physical Exam Vitals and nursing note reviewed. Exam conducted with a chaperone present.  Constitutional:      General: She is not in acute distress.    Appearance: She is well-developed. She is not toxic-appearing.  HENT:  Head: Normocephalic and atraumatic.  Cardiovascular:     Rate and Rhythm: Normal rate and regular rhythm.  Pulmonary:     Effort: Pulmonary effort is normal.     Breath sounds: Normal breath sounds.  Chest:  Breasts:    Right: Normal.     Left: Normal.     Comments: (Pt has spasm in back when supine and raised arms over head for exam.) Abdominal:     General: Bowel sounds are normal.     Palpations: Abdomen is soft. There is no hepatomegaly, splenomegaly, mass or pulsatile mass.     Tenderness: There is no abdominal tenderness. There is no guarding or rebound.  Genitourinary:    Labia:        Right: No rash, tenderness or lesion.        Left: No rash, tenderness or lesion.      Vagina: Normal.     Cervix: No cervical motion tenderness, discharge or friability.     Uterus: Normal.      Adnexa:         Right: No mass, tenderness or fullness.         Left: No mass, tenderness or fullness.       Comments: (Nurse Berenice assisted Musculoskeletal:     Cervical back: Neck supple.     Right lower leg: No edema.     Left lower leg: No edema.  Lymphadenopathy:     Cervical: No cervical adenopathy.  Skin:    General: Skin is warm and dry.  Neurological:     Mental Status: She is alert and oriented to person, place, and time.  Psychiatric:        Behavior: Behavior normal.     Results for orders placed or performed during the hospital encounter of 10/04/22  Lipid panel  Result Value Ref Range   Cholesterol 130 0 - 200 mg/dL   Triglycerides 956 (H) <150 mg/dL   HDL 33 (L) >38 mg/dL   Total CHOL/HDL Ratio 3.9 RATIO   VLDL 51 (H) 0 - 40 mg/dL   LDL Cholesterol 46 0 - 99 mg/dL  Comprehensive metabolic panel  Result Value Ref Range   Sodium 135 135 - 145 mmol/L   Potassium 4.3 3.5 - 5.1 mmol/L   Chloride 99 98 - 111 mmol/L   CO2 26 22 - 32 mmol/L   Glucose, Bld 142 (H) 70 - 99 mg/dL   BUN 20 8 - 23 mg/dL   Creatinine, Ser 7.56 0.44 - 1.00 mg/dL   Calcium 9.1 8.9 - 43.3 mg/dL   Total Protein 7.9 6.5 - 8.1 g/dL   Albumin 3.9 3.5 - 5.0 g/dL   AST 19 15 - 41 U/L   ALT 19 0 - 44 U/L   Alkaline Phosphatase 75 38 - 126 U/L   Total Bilirubin 0.8 0.3 - 1.2 mg/dL   GFR, Estimated >29 >51 mL/min   Anion gap 10 5 - 15  Hemoglobin A1c  Result Value Ref Range   Hgb A1c MFr Bld 7.8 (H) 4.8 - 5.6 %   Mean Plasma Glucose 177.16 mg/dL        Assessment & Plan:   Encounter Diagnoses  Name Primary?   Uncontrolled type 2 diabetes mellitus with hyperglycemia (HCC) Yes   Encounter for routine gynecological examination with Papanicolaou smear of cervix    Essential hypertension    Hyperlipidemia, unspecified hyperlipidemia type    Not proficient in English language    Diabetic retinopathy associated  with type 2 diabetes mellitus, macular edema presence unspecified, unspecified laterality,  unspecified retinopathy severity (HCC)      DM -updated DM Foot exam -she is counseled to Watch dm diet -continue current meds.  Will increase insulin at next OV if still high  HTN -good  Dyslipidemia -stable  HCM -pap updated  Abdominal issue -Abd issues likely a muscle cramp/spasm.  Pt to contact office if becomes worse or develops new symptoms  Diabetic retinopathy -f/u [facility] eye dr at [facility] was rescheduled.  Pt was encouraged to get to the appt  Pt to follow up here 3 months. She is to contact office sooner prn

## 2022-10-06 LAB — CYTOLOGY - PAP
Adequacy: ABSENT
Comment: NEGATIVE
Diagnosis: NEGATIVE
High risk HPV: NEGATIVE

## 2022-10-12 ENCOUNTER — Other Ambulatory Visit: Payer: Self-pay | Admitting: Physician Assistant

## 2022-10-12 MED ORDER — METRONIDAZOLE 500 MG PO TABS: 500.0000 mg | ORAL_TABLET | Freq: Three times a day (TID) | ORAL | 0 refills | Status: AC

## 2022-10-24 ENCOUNTER — Other Ambulatory Visit: Payer: Self-pay | Admitting: Physician Assistant

## 2022-10-24 MED ORDER — GABAPENTIN 300 MG PO CAPS: 300.0000 mg | ORAL_CAPSULE | Freq: Every day | ORAL | 0 refills | Status: DC

## 2022-11-09 ENCOUNTER — Other Ambulatory Visit: Payer: Self-pay | Admitting: Physician Assistant

## 2022-11-09 ENCOUNTER — Other Ambulatory Visit: Payer: Self-pay | Admitting: Nurse Practitioner

## 2022-11-14 ENCOUNTER — Other Ambulatory Visit: Payer: Self-pay | Admitting: Physician Assistant

## 2022-11-14 MED ORDER — AMLODIPINE BESYLATE 10 MG PO TABS: 10.0000 mg | ORAL_TABLET | Freq: Every day | ORAL | 0 refills | Status: DC

## 2022-11-14 MED ORDER — ATORVASTATIN CALCIUM 80 MG PO TABS: ORAL_TABLET | ORAL | 0 refills | Status: DC

## 2022-11-14 MED ORDER — METFORMIN HCL 1000 MG PO TABS: ORAL_TABLET | ORAL | 0 refills | Status: DC

## 2022-11-14 MED ORDER — NOVOLIN 70/30 RELION (70-30) 100 UNIT/ML ~~LOC~~ SUSP: SUBCUTANEOUS | 0 refills | Status: DC

## 2022-11-14 MED ORDER — LOSARTAN POTASSIUM 100 MG PO TABS: 100.0000 mg | ORAL_TABLET | Freq: Every day | ORAL | 0 refills | Status: DC

## 2022-11-22 ENCOUNTER — Other Ambulatory Visit: Payer: Self-pay | Admitting: Physician Assistant

## 2022-11-22 MED ORDER — GABAPENTIN 300 MG PO CAPS: 300.0000 mg | ORAL_CAPSULE | Freq: Every day | ORAL | 0 refills | Status: DC

## 2022-12-14 ENCOUNTER — Other Ambulatory Visit: Payer: Self-pay | Admitting: Physician Assistant

## 2022-12-14 DIAGNOSIS — I10 Essential (primary) hypertension: Secondary | ICD-10-CM

## 2022-12-14 DIAGNOSIS — E1165 Type 2 diabetes mellitus with hyperglycemia: Secondary | ICD-10-CM

## 2022-12-14 DIAGNOSIS — E785 Hyperlipidemia, unspecified: Secondary | ICD-10-CM

## 2022-12-28 ENCOUNTER — Other Ambulatory Visit: Payer: Self-pay | Admitting: Physician Assistant

## 2022-12-29 ENCOUNTER — Other Ambulatory Visit: Payer: Self-pay | Admitting: Physician Assistant

## 2023-01-05 ENCOUNTER — Ambulatory Visit: Payer: Self-pay | Admitting: Physician Assistant

## 2023-01-09 ENCOUNTER — Other Ambulatory Visit (HOSPITAL_COMMUNITY)
Admission: RE | Admit: 2023-01-09 | Discharge: 2023-01-09 | Disposition: A | Discharge status: Home / Self Care | Payer: Self-pay | Source: Ambulatory Visit | Attending: Physician Assistant | Admitting: Physician Assistant

## 2023-01-09 DIAGNOSIS — I10 Essential (primary) hypertension: Secondary | ICD-10-CM | POA: Insufficient documentation

## 2023-01-09 DIAGNOSIS — E785 Hyperlipidemia, unspecified: Secondary | ICD-10-CM | POA: Insufficient documentation

## 2023-01-09 DIAGNOSIS — E1165 Type 2 diabetes mellitus with hyperglycemia: Secondary | ICD-10-CM | POA: Insufficient documentation

## 2023-01-09 LAB — HEMOGLOBIN A1C
Hgb A1c MFr Bld: 8.1 % — ABNORMAL HIGH (ref 4.8–5.6)
Mean Plasma Glucose: 185.77 mg/dL

## 2023-01-09 LAB — COMPREHENSIVE METABOLIC PANEL
ALT: 24 U/L (ref 0–44)
AST: 23 U/L (ref 15–41)
Albumin: 4.1 g/dL (ref 3.5–5.0)
Alkaline Phosphatase: 57 U/L (ref 38–126)
Anion gap: 8 (ref 5–15)
BUN: 19 mg/dL (ref 8–23)
CO2: 26 mmol/L (ref 22–32)
Calcium: 8.9 mg/dL (ref 8.9–10.3)
Chloride: 99 mmol/L (ref 98–111)
Creatinine, Ser: 0.88 mg/dL (ref 0.44–1.00)
GFR, Estimated: >60 mL/min (ref >60)
Glucose, Bld: 139 mg/dL — ABNORMAL HIGH (ref 70–99)
Potassium: 4.4 mmol/L (ref 3.5–5.1)
Sodium: 133 mmol/L — ABNORMAL LOW (ref 135–145)
Total Bilirubin: 1 mg/dL (ref <1.2)
Total Protein: 8 g/dL (ref 6.5–8.1)

## 2023-01-09 LAB — LIPID PANEL
Cholesterol: 115 mg/dL (ref 0–200)
HDL: 40 mg/dL — ABNORMAL LOW (ref >40)
LDL Cholesterol: 55 mg/dL (ref 0–99)
Total CHOL/HDL Ratio: 2.9 {ratio}
Triglycerides: 100 mg/dL (ref <150)
VLDL: 20 mg/dL (ref 0–40)

## 2023-01-10 ENCOUNTER — Encounter: Payer: Self-pay | Admitting: Physician Assistant

## 2023-01-10 ENCOUNTER — Ambulatory Visit: Payer: Self-pay | Admitting: Physician Assistant

## 2023-01-10 VITALS — BP 102/63 | HR 65 | Temp 97.3°F | Ht 60.5 in | Wt 165.0 lb

## 2023-01-10 DIAGNOSIS — I10 Essential (primary) hypertension: Secondary | ICD-10-CM

## 2023-01-10 DIAGNOSIS — Z789 Other specified health status: Secondary | ICD-10-CM

## 2023-01-10 DIAGNOSIS — E1165 Type 2 diabetes mellitus with hyperglycemia: Secondary | ICD-10-CM

## 2023-01-10 DIAGNOSIS — E785 Hyperlipidemia, unspecified: Secondary | ICD-10-CM

## 2023-01-10 DIAGNOSIS — Z23 Encounter for immunization: Secondary | ICD-10-CM

## 2023-01-10 LAB — MICROALBUMIN, URINE: Microalb, Ur: 6.8 ug/mL — ABNORMAL HIGH

## 2023-01-10 MED ORDER — NOVOLIN 70/30 RELION (70-30) 100 UNIT/ML ~~LOC~~ SUSP: SUBCUTANEOUS | Status: DC

## 2023-01-10 NOTE — Progress Notes (Signed)
BP 102/63   Pulse 65   Temp (!) 97.3 F (36.3 C)   Ht 5' 0.5" (1.537 m)   Wt 165 lb (74.8 kg)   LMP 02/15/2011 (Approximate)   SpO2 99%   BMI 31.69 kg/m    Subjective:    Patient ID: Jeraldine Loots, female    DOB: [birth year], 61 y.o.   MRN: [id number]  HPI: Girl Lorio is a 62 y.o. female presenting on 01/10/2023 for Diabetes and Hyperlipidemia   HPI  Chief Complaint  Patient presents with   Diabetes   Hyperlipidemia   Pt is doing well.  She has had some stress recently and has been eating more to self-soothe.  She has no other complaints today.  Relevant past medical, surgical, family and social history reviewed and updated as indicated. Interim medical history since our last visit reviewed. Allergies and medications reviewed and updated.   Current Outpatient Medications:    amLODipine (NORVASC) 10 MG tablet, Take 1 tablet (10 mg total) by mouth daily. TAKE 1 Tablet BY MOUTH ONCE EVERY DAY  Tome una tableta por boca diaria, Disp: 90 tablet, Rfl: 0   atorvastatin (LIPITOR) 80 MG tablet, TAKE 1 Tablet BY MOUTH ONCE EVERY NIGHT AT BEDTIME, Disp: 90 tablet, Rfl: 0   gabapentin (NEURONTIN) 300 MG capsule, Take 1 capsule by mouth at bedtime, Disp: 30 capsule, Rfl: 0   insulin NPH-regular Human (NOVOLIN 70/30 RELION) (70-30) 100 UNIT/ML injection, 20 units with breakfast and 20 units with supper if glucose is above 90 and she is eating., Disp: 30 mL, Rfl: 0   losartan (COZAAR) 100 MG tablet, Take 1 tablet (100 mg total) by mouth daily. TAKE 1 Tablet BY MOUTH ONCE EVERY DAY  Tome una tableta por boca diaria, Disp: 90 tablet, Rfl: 0   metFORMIN (GLUCOPHAGE) 1000 MG tablet, TAKE 1 Tablet  BY MOUTH TWICE DAILY WITH A MEAL, Disp: 180 tablet, Rfl: 0   multivitamin-iron-minerals-folic acid (CENTRUM) chewable tablet, Chew 1 tablet by mouth daily., Disp: , Rfl:    Omega-3 Fatty Acids (FISH OIL) 1000 MG CAPS, Take 1 capsule (1,000 mg total) by mouth in the morning and at bedtime.  (Patient taking differently: Take 1 capsule by mouth at bedtime.), Disp: 60 capsule, Rfl: 2   omeprazole (PRILOSEC) 20 MG capsule, TAKE 1 Capsule BY MOUTH ONCE EVERY DAY, Disp: 90 capsule, Rfl: 1   cyclobenzaprine (FLEXERIL) 10 MG tablet, TAKE 1 TABLET BY MOUTH TWICE DAILY AS NEEDED FOR MUSCLE SPASM (Patient not taking: Reported on 03/29/2022), Disp: 20 tablet, Rfl: 0    Review of Systems  Per HPI unless specifically indicated above     Objective:    BP 102/63   Pulse 65   Temp (!) 97.3 F (36.3 C)   Ht 5' 0.5" (1.537 m)   Wt 165 lb (74.8 kg)   LMP 02/15/2011 (Approximate)   SpO2 99%   BMI 31.69 kg/m   Wt Readings from Last 3 Encounters:  01/10/23 165 lb (74.8 kg)  10/05/22 163 lb 8 oz (74.2 kg)  07/05/22 164 lb (74.4 kg)    Physical Exam Vitals reviewed.  Constitutional:      General: She is not in acute distress.    Appearance: She is well-developed. She is not toxic-appearing.  HENT:     Head: Normocephalic and atraumatic.  Cardiovascular:     Rate and Rhythm: Normal rate and regular rhythm.  Pulmonary:     Effort: Pulmonary effort is normal.     Breath  sounds: Normal breath sounds.  Abdominal:     General: Bowel sounds are normal.     Palpations: Abdomen is soft. There is no mass.     Tenderness: There is no abdominal tenderness.  Musculoskeletal:     Cervical back: Neck supple.     Right lower leg: No edema.     Left lower leg: No edema.  Lymphadenopathy:     Cervical: No cervical adenopathy.  Skin:    General: Skin is warm and dry.  Neurological:     Mental Status: She is alert and oriented to person, place, and time.  Psychiatric:        Behavior: Behavior normal.     Results for orders placed or performed during the hospital encounter of 01/09/23  Comprehensive metabolic panel  Result Value Ref Range   Sodium 133 (L) 135 - 145 mmol/L   Potassium 4.4 3.5 - 5.1 mmol/L   Chloride 99 98 - 111 mmol/L   CO2 26 22 - 32 mmol/L   Glucose, Bld 139 (H) 70  - 99 mg/dL   BUN 19 8 - 23 mg/dL   Creatinine, Ser 7.82 0.44 - 1.00 mg/dL   Calcium 8.9 8.9 - 95.6 mg/dL   Total Protein 8.0 6.5 - 8.1 g/dL   Albumin 4.1 3.5 - 5.0 g/dL   AST 23 15 - 41 U/L   ALT 24 0 - 44 U/L   Alkaline Phosphatase 57 38 - 126 U/L   Total Bilirubin 1.0 <1.2 mg/dL   GFR, Estimated >21 >30 mL/min   Anion gap 8 5 - 15  Lipid panel  Result Value Ref Range   Cholesterol 115 0 - 200 mg/dL   Triglycerides 865 <784 mg/dL   HDL 40 (L) >69 mg/dL   Total CHOL/HDL Ratio 2.9 RATIO   VLDL 20 0 - 40 mg/dL   LDL Cholesterol 55 0 - 99 mg/dL  Hemoglobin G2X  Result Value Ref Range   Hgb A1c MFr Bld 8.1 (H) 4.8 - 5.6 %   Mean Plasma Glucose 185.77 mg/dL      Assessment & Plan:    Encounter Diagnoses  Name Primary?   Uncontrolled type 2 diabetes mellitus with hyperglycemia (HCC) Yes   Essential hypertension    Hyperlipidemia, unspecified hyperlipidemia type    Need for COVID-19 vaccine    Need for influenza vaccination    Not proficient in [place] language      DM -reviewed labs with pt  -pt counseled to watch DM diet -incerase insulin to 25u bid -continue metformin   HCM:  UTD Mammogram- 8/24 DM Foot exam 8/24 Pap 8/24 Colonoscopy 12/23 DM eye exam- 10/24   done  today Covid vax  Flu vax  Vax-pt was screened by nurse Manuella Ghazi and vaccinations were administered.  She was monitored for 15 minutes after with no effects.  She was given VIS documents   Pt to followup 3 months. She is to contact office sooner prn

## 2023-01-17 ENCOUNTER — Ambulatory Visit: Payer: Self-pay | Admitting: Physician Assistant

## 2023-01-18 ENCOUNTER — Ambulatory Visit: Payer: Self-pay | Admitting: Physician Assistant

## 2023-01-23 ENCOUNTER — Other Ambulatory Visit: Payer: Self-pay | Admitting: Physician Assistant

## 2023-02-27 ENCOUNTER — Other Ambulatory Visit: Payer: Self-pay | Admitting: Physician Assistant

## 2023-02-28 ENCOUNTER — Other Ambulatory Visit: Payer: Self-pay | Admitting: Physician Assistant

## 2023-03-27 ENCOUNTER — Other Ambulatory Visit: Payer: Self-pay | Admitting: Physician Assistant

## 2023-03-27 DIAGNOSIS — E1165 Type 2 diabetes mellitus with hyperglycemia: Secondary | ICD-10-CM

## 2023-03-27 DIAGNOSIS — E785 Hyperlipidemia, unspecified: Secondary | ICD-10-CM

## 2023-03-27 DIAGNOSIS — I10 Essential (primary) hypertension: Secondary | ICD-10-CM

## 2023-04-03 ENCOUNTER — Other Ambulatory Visit (HOSPITAL_COMMUNITY)
Admission: RE | Admit: 2023-04-03 | Discharge: 2023-04-03 | Disposition: A | Discharge status: Home / Self Care | Payer: Self-pay | Source: Ambulatory Visit | Attending: Physician Assistant | Admitting: Physician Assistant

## 2023-04-03 DIAGNOSIS — E1165 Type 2 diabetes mellitus with hyperglycemia: Secondary | ICD-10-CM

## 2023-04-03 DIAGNOSIS — I10 Essential (primary) hypertension: Secondary | ICD-10-CM

## 2023-04-03 DIAGNOSIS — E785 Hyperlipidemia, unspecified: Secondary | ICD-10-CM

## 2023-04-03 LAB — LIPID PANEL
Cholesterol: 98 mg/dL (ref 0–200)
HDL: 34 mg/dL — ABNORMAL LOW (ref >40)
LDL Cholesterol: 50 mg/dL (ref 0–99)
Total CHOL/HDL Ratio: 2.9 {ratio}
Triglycerides: 69 mg/dL (ref <150)
VLDL: 14 mg/dL (ref 0–40)

## 2023-04-03 LAB — COMPREHENSIVE METABOLIC PANEL
ALT: 17 U/L (ref 0–44)
AST: 19 U/L (ref 15–41)
Albumin: 3.8 g/dL (ref 3.5–5.0)
Alkaline Phosphatase: 62 U/L (ref 38–126)
Anion gap: 9 (ref 5–15)
BUN: 12 mg/dL (ref 8–23)
CO2: 26 mmol/L (ref 22–32)
Calcium: 9.1 mg/dL (ref 8.9–10.3)
Chloride: 99 mmol/L (ref 98–111)
Creatinine, Ser: 0.85 mg/dL (ref 0.44–1.00)
GFR, Estimated: >60 mL/min (ref >60)
Glucose, Bld: 144 mg/dL — ABNORMAL HIGH (ref 70–99)
Potassium: 4.3 mmol/L (ref 3.5–5.1)
Sodium: 134 mmol/L — ABNORMAL LOW (ref 135–145)
Total Bilirubin: 0.7 mg/dL (ref 0.0–1.2)
Total Protein: 7.5 g/dL (ref 6.5–8.1)

## 2023-04-03 LAB — HEMOGLOBIN A1C
Hgb A1c MFr Bld: 7.1 % — ABNORMAL HIGH (ref 4.8–5.6)
Mean Plasma Glucose: 157.07 mg/dL

## 2023-04-04 ENCOUNTER — Other Ambulatory Visit: Payer: Self-pay | Admitting: Physician Assistant

## 2023-04-12 ENCOUNTER — Encounter: Payer: Self-pay | Admitting: Physician Assistant

## 2023-04-12 ENCOUNTER — Ambulatory Visit: Payer: Self-pay | Admitting: Physician Assistant

## 2023-04-12 VITALS — BP 110/66 | HR 64 | Temp 97.3°F | Ht 60.5 in | Wt 163.0 lb

## 2023-04-12 DIAGNOSIS — Z789 Other specified health status: Secondary | ICD-10-CM

## 2023-04-12 DIAGNOSIS — R1030 Lower abdominal pain, unspecified: Secondary | ICD-10-CM

## 2023-04-12 DIAGNOSIS — E11319 Type 2 diabetes mellitus with unspecified diabetic retinopathy without macular edema: Secondary | ICD-10-CM

## 2023-04-12 DIAGNOSIS — E1165 Type 2 diabetes mellitus with hyperglycemia: Secondary | ICD-10-CM

## 2023-04-12 DIAGNOSIS — E785 Hyperlipidemia, unspecified: Secondary | ICD-10-CM

## 2023-04-12 DIAGNOSIS — I10 Essential (primary) hypertension: Secondary | ICD-10-CM

## 2023-04-12 MED ORDER — DEXLANSOPRAZOLE 60 MG PO CPDR: 60.0000 mg | DELAYED_RELEASE_CAPSULE | Freq: Every day | ORAL | Status: DC

## 2023-04-12 NOTE — Progress Notes (Signed)
 BP 110/66   Pulse 64   Temp (!) 97.3 F (36.3 C)   Ht 5' 0.5" (1.537 m)   Wt 163 lb (73.9 kg)   LMP 02/15/2011 (Approximate)   SpO2 99%   BMI 31.31 kg/m    Subjective:    Patient ID: Amanda Zamora, female    DOB: 1960/11/05, 63 y.o.   MRN: [id number]  HPI: Amanda Zamora is a 63 y.o. female presenting on 04/12/2023 for Diabetes, Hyperlipidemia, Hypertension, and Abdominal Pain (Lower abd pain for about 2 weeks off and on - since Sunday the pain has worsened. Pt states the pain starts after she eats or drinks anything including water and doesn't go away until late afternoon. Pt denies no n/v, some diarrhea and belching. Omeprazole helps sometimes.)   HPI  Chief Complaint  Patient presents with   Diabetes   Hyperlipidemia   Hypertension   Abdominal Pain    Lower abd pain for about 2 weeks off and on - since Sunday the pain has worsened. Pt states the pain starts after she eats or drinks anything including water and doesn't go away until late afternoon. Pt denies no n/v, some diarrhea and belching. Omeprazole helps sometimes.     BMs  regular and normal- but the pain makes her have diarrhea and she has a lot of gas.  The pain is really bad- 10/10.  It lasts about 5 minutes.  Then it starts and stops.   No blood in stool.   Pain lower abd all the way across.   It is every day.  Bad in the morning and gets better in the afternoon.  She denies N/V.  She denies dysuria, fever.   She had colonosopy in 2023 with recommendations for repeat in 3 years for surveillence due to polyps  Pt is s/p appendectomy and cholecystectomy.      Relevant past medical, surgical, family and social history reviewed and updated as indicated. Interim medical history since our last visit reviewed. Allergies and medications reviewed and updated.   Current Outpatient Medications:    amLODipine (NORVASC) 10 MG tablet, Take 1 tablet (10 mg total) by mouth daily. TAKE 1 Tablet BY MOUTH ONCE EVERY DAY   Tome una tableta por boca diaria, Disp: 90 tablet, Rfl: 0   atorvastatin (LIPITOR) 80 MG tablet, TAKE 1 Tablet BY MOUTH ONCE EVERY NIGHT AT BEDTIME, Disp: 90 tablet, Rfl: 0   gabapentin (NEURONTIN) 300 MG capsule, Take 1 capsule by mouth at bedtime, Disp: 30 capsule, Rfl: 0   insulin NPH-regular Human (HUMULIN 70/30) (70-30) 100 UNIT/ML injection, INJECT 20 UNITS UNDER THE SKIN WITH BREAKFAST, AND 20 UNITS WITH SUPPER IF GLUCOSE IS ABOVE 90 AND SHE IS EATING, Disp: 40 mL, Rfl: 0   losartan (COZAAR) 100 MG tablet, Take 1 tablet (100 mg total) by mouth daily. TAKE 1 Tablet BY MOUTH ONCE EVERY DAY  Tome una tableta por boca diaria, Disp: 90 tablet, Rfl: 0   metFORMIN (GLUCOPHAGE) 1000 MG tablet, TAKE 1 Tablet  BY MOUTH TWICE DAILY WITH A MEAL, Disp: 180 tablet, Rfl: 0   multivitamin-iron-minerals-folic acid (CENTRUM) chewable tablet, Chew 1 tablet by mouth daily., Disp: , Rfl:    Omega-3 Fatty Acids (FISH OIL) 1000 MG CAPS, Take 1 capsule (1,000 mg total) by mouth in the morning and at bedtime. (Patient taking differently: Take 1 capsule by mouth at bedtime.), Disp: 60 capsule, Rfl: 2   omeprazole (PRILOSEC) 20 MG capsule, TAKE 1 Capsule BY MOUTH ONCE EVERY DAY,  Disp: 90 capsule, Rfl: 1   cyclobenzaprine (FLEXERIL) 10 MG tablet, TAKE 1 TABLET BY MOUTH TWICE DAILY AS NEEDED FOR MUSCLE SPASM (Patient not taking: No sig reported), Disp: 20 tablet, Rfl: 0    Review of Systems  Per HPI unless specifically indicated above     Objective:    BP 110/66   Pulse 64   Temp (!) 97.3 F (36.3 C)   Ht 5' 0.5" (1.537 m)   Wt 163 lb (73.9 kg)   LMP 02/15/2011 (Approximate)   SpO2 99%   BMI 31.31 kg/m   Wt Readings from Last 3 Encounters:  04/12/23 163 lb (73.9 kg)  01/10/23 165 lb (74.8 kg)  10/05/22 163 lb 8 oz (74.2 kg)    Physical Exam Vitals reviewed.  Constitutional:      General: She is not in acute distress.    Appearance: She is well-developed. She is not toxic-appearing.  HENT:      Head: Normocephalic and atraumatic.  Cardiovascular:     Rate and Rhythm: Normal rate and regular rhythm.  Pulmonary:     Effort: Pulmonary effort is normal.     Breath sounds: Normal breath sounds.  Abdominal:     General: Bowel sounds are normal.     Palpations: Abdomen is soft. There is no hepatomegaly, splenomegaly, mass or pulsatile mass.     Tenderness: There is no abdominal tenderness. There is no guarding or rebound.  Musculoskeletal:     Cervical back: Neck supple.     Right lower leg: No edema.     Left lower leg: No edema.  Lymphadenopathy:     Cervical: No cervical adenopathy.  Skin:    General: Skin is warm and dry.  Neurological:     Mental Status: She is alert and oriented to person, place, and time.  Psychiatric:        Behavior: Behavior normal.     Results for orders placed or performed during the hospital encounter of 04/03/23  Hemoglobin A1c   Collection Time: 04/03/23 12:09 PM  Result Value Ref Range   Hgb A1c MFr Bld 7.1 (H) 4.8 - 5.6 %   Mean Plasma Glucose 157.07 mg/dL  Lipid panel   Collection Time: 04/03/23 12:09 PM  Result Value Ref Range   Cholesterol 98 0 - 200 mg/dL   Triglycerides 69 <132 mg/dL   HDL 34 (L) >44 mg/dL   Total CHOL/HDL Ratio 2.9 RATIO   VLDL 14 0 - 40 mg/dL   LDL Cholesterol 50 0 - 99 mg/dL  Comprehensive metabolic panel   Collection Time: 04/03/23 12:09 PM  Result Value Ref Range   Sodium 134 (L) 135 - 145 mmol/L   Potassium 4.3 3.5 - 5.1 mmol/L   Chloride 99 98 - 111 mmol/L   CO2 26 22 - 32 mmol/L   Glucose, Bld 144 (H) 70 - 99 mg/dL   BUN 12 8 - 23 mg/dL   Creatinine, Ser 0.10 0.44 - 1.00 mg/dL   Calcium 9.1 8.9 - 27.2 mg/dL   Total Protein 7.5 6.5 - 8.1 g/dL   Albumin 3.8 3.5 - 5.0 g/dL   AST 19 15 - 41 U/L   ALT 17 0 - 44 U/L   Alkaline Phosphatase 62 38 - 126 U/L   Total Bilirubin 0.7 0.0 - 1.2 mg/dL   GFR, Estimated >53 >66 mL/min   Anion gap 9 5 - 15      Assessment & Plan:    Encounter Diagnoses  Name Primary?   Type 2 diabetes mellitus with hyperglycemia, unspecified whether long term insulin use (HCC) Yes   Essential hypertension    Hyperlipidemia, unspecified hyperlipidemia type    Diabetic retinopathy associated with type 2 diabetes mellitus, macular edema presence unspecified, unspecified laterality, unspecified retinopathy severity (HCC)    Lower abdominal pain    Not proficient in [place] language      DM -reviewed labs with pt -controlled.  Continue metformin and 70/30 insulin  HTN -well controlled.  Continue amlodipine and losartan  Dyslipiidemia -well controlled.  Continue atorvastatin 80mg  daily  Proliferative retinopathy OU -last seen by ophthalmology 11/21/22 with recommendations to f/u 6 months.  Pt knows to call to schedule her f/u appointment  HCM -up to date  Abd pain -Dexilant 60mg  every day.  Hold omeprazole while on this.  She is to call office Monday if this does not help.  She is to call office if worsens or for new symptoms.  Pt is in agreement with plan   Pt to follow up 3 months.  She is to contact office sooner prn

## 2023-04-17 ENCOUNTER — Telehealth: Payer: Self-pay | Admitting: Physician Assistant

## 2023-04-17 NOTE — Telephone Encounter (Signed)
 Pt called today as follow up to OV last week.  She says her abdominal pain is all gone and she is feeling good today.

## 2023-05-04 ENCOUNTER — Other Ambulatory Visit: Payer: Self-pay | Admitting: Physician Assistant

## 2023-05-24 ENCOUNTER — Other Ambulatory Visit: Payer: Self-pay | Admitting: Physician Assistant

## 2023-05-31 ENCOUNTER — Other Ambulatory Visit: Payer: Self-pay | Admitting: Physician Assistant

## 2023-06-26 ENCOUNTER — Other Ambulatory Visit: Payer: Self-pay | Admitting: Physician Assistant

## 2023-06-26 DIAGNOSIS — I10 Essential (primary) hypertension: Secondary | ICD-10-CM

## 2023-06-26 DIAGNOSIS — E1165 Type 2 diabetes mellitus with hyperglycemia: Secondary | ICD-10-CM

## 2023-07-03 ENCOUNTER — Other Ambulatory Visit: Payer: Self-pay | Admitting: Physician Assistant

## 2023-07-12 ENCOUNTER — Other Ambulatory Visit (HOSPITAL_COMMUNITY)
Admission: RE | Admit: 2023-07-12 | Discharge: 2023-07-12 | Disposition: A | Discharge status: Home / Self Care | Payer: Self-pay | Source: Ambulatory Visit | Attending: Physician Assistant | Admitting: Physician Assistant

## 2023-07-12 ENCOUNTER — Ambulatory Visit: Payer: Self-pay | Admitting: Physician Assistant

## 2023-07-12 ENCOUNTER — Encounter: Payer: Self-pay | Admitting: Physician Assistant

## 2023-07-12 VITALS — BP 118/66 | HR 85 | Temp 97.1°F | Ht 60.5 in | Wt 168.0 lb

## 2023-07-12 DIAGNOSIS — E11319 Type 2 diabetes mellitus with unspecified diabetic retinopathy without macular edema: Secondary | ICD-10-CM

## 2023-07-12 DIAGNOSIS — E1165 Type 2 diabetes mellitus with hyperglycemia: Secondary | ICD-10-CM | POA: Insufficient documentation

## 2023-07-12 DIAGNOSIS — Z1239 Encounter for other screening for malignant neoplasm of breast: Secondary | ICD-10-CM

## 2023-07-12 DIAGNOSIS — I10 Essential (primary) hypertension: Secondary | ICD-10-CM | POA: Insufficient documentation

## 2023-07-12 DIAGNOSIS — Z789 Other specified health status: Secondary | ICD-10-CM

## 2023-07-12 DIAGNOSIS — J309 Allergic rhinitis, unspecified: Secondary | ICD-10-CM

## 2023-07-12 LAB — BASIC METABOLIC PANEL WITH GFR
Anion gap: 12 (ref 5–15)
BUN: 14 mg/dL (ref 8–23)
CO2: 27 mmol/L (ref 22–32)
Calcium: 9.1 mg/dL (ref 8.9–10.3)
Chloride: 97 mmol/L — ABNORMAL LOW (ref 98–111)
Creatinine, Ser: 0.87 mg/dL (ref 0.44–1.00)
GFR, Estimated: >60 mL/min (ref >60)
Glucose, Bld: 203 mg/dL — ABNORMAL HIGH (ref 70–99)
Potassium: 4.4 mmol/L (ref 3.5–5.1)
Sodium: 136 mmol/L (ref 135–145)

## 2023-07-12 LAB — HEMOGLOBIN A1C
Hgb A1c MFr Bld: 7.4 % — ABNORMAL HIGH (ref 4.8–5.6)
Mean Plasma Glucose: 165.68 mg/dL

## 2023-07-12 MED ORDER — CETIRIZINE HCL 10 MG PO TABS: 10.0000 mg | ORAL_TABLET | Freq: Every day | ORAL | 1 refills | Status: DC

## 2023-07-12 NOTE — Patient Instructions (Addendum)
 Plan of Treatment - Upcoming Encounters Upcoming Encounters Date Type Department  Description  09/07/2023 3:00 PM EDT Office Visit [facility]  [address]  [address]  [place], [place] [address]  [phone]

## 2023-07-12 NOTE — Progress Notes (Signed)
 BP 118/66   Pulse 85   Temp (!) 97.1 F (36.2 C)   Ht 5' 0.5" (1.537 m)   Wt 168 lb (76.2 kg)   LMP 02/15/2011 (Approximate)   SpO2 91%   BMI 32.27 kg/m    Subjective:    Patient ID: Amanda Zamora, female    DOB: 01/03/1961, 63 y.o.   MRN: [id number]  HPI: Amanda Zamora is a 63 y.o. female presenting on 07/12/2023 for Diabetes and Hypertension   HPI   Headaces for past 2 weeks.  Back of her neck and front of her head. Lack of energy and all she wants to do is sleep.   She has no urged to eat.     Usually walks but no energy lately- for 20 - 30 minutes.     She is having no abdominal pains.  She denies sob, cp.     Relevant past medical, surgical, family and social history reviewed and updated as indicated. Interim medical history since our last visit reviewed. Allergies and medications reviewed and updated.    Current Outpatient Medications:    amLODipine  (NORVASC ) 10 MG tablet, TAKE 1 Tablet BY MOUTH ONCE EVERY DAY, Disp: 90 tablet, Rfl: 0   atorvastatin  (LIPITOR) 80 MG tablet, TAKE 1 Tablet BY MOUTH ONCE EVERY NIGHT AT BEDTIME, Disp: 90 tablet, Rfl: 0   gabapentin  (NEURONTIN ) 300 MG capsule, Take 1 capsule by mouth at bedtime, Disp: 30 capsule, Rfl: 0   HUMULIN 70/30 (70-30) 100 UNIT/ML injection, INJECT 20 UNITS UNDER THE SKIN WITH BREAKFAST, AND 20 UNITS WITH SUPPER IF GLUCOSE IS ABOVE 90 AND SHE IS EATING (Patient taking differently: INJECT 25 UNITS UNDER THE SKIN WITH BREAKFAST, AND 25 UNITS WITH SUPPER IF GLUCOSE IS ABOVE 90 AND SHE IS EATING), Disp: 40 mL, Rfl: 0   losartan  (COZAAR ) 100 MG tablet, TAKE 1 Tablet BY MOUTH ONCE EVERY DAY, Disp: 90 tablet, Rfl: 0   metFORMIN  (GLUCOPHAGE ) 1000 MG tablet, TAKE 1 Tablet  BY MOUTH TWICE DAILY WITH A MEAL, Disp: 180 tablet, Rfl: 0   Omega-3 Fatty Acids (FISH OIL ) 1000 MG CAPS, Take 1 capsule (1,000 mg total) by mouth in the morning and at bedtime. (Patient taking differently: Take 1 capsule by mouth at bedtime.), Disp:  60 capsule, Rfl: 2   omeprazole  (PRILOSEC) 20 MG capsule, TAKE 1 Capsule BY MOUTH ONCE EVERY DAY, Disp: 90 capsule, Rfl: 1   cyclobenzaprine  (FLEXERIL ) 10 MG tablet, TAKE 1 TABLET BY MOUTH TWICE DAILY AS NEEDED FOR MUSCLE SPASM (Patient not taking: No sig reported), Disp: 20 tablet, Rfl: 0   dexlansoprazole  (DEXILANT ) 60 MG capsule, Take 1 capsule (60 mg total) by mouth daily. (Patient not taking: Reported on 07/12/2023), Disp: , Rfl:    multivitamin-iron-minerals-folic acid (CENTRUM) chewable tablet, Chew 1 tablet by mouth daily. (Patient not taking: Reported on 07/12/2023), Disp: , Rfl:     Review of Systems  Per HPI unless specifically indicated above     Objective:     BP 118/66   Pulse 85   Temp (!) 97.1 F (36.2 C)   Ht 5' 0.5" (1.537 m)   Wt 168 lb (76.2 kg)   LMP 02/15/2011 (Approximate)   SpO2 91%   BMI 32.27 kg/m   Wt Readings from Last 3 Encounters:  07/12/23 168 lb (76.2 kg)  04/12/23 163 lb (73.9 kg)  01/10/23 165 lb (74.8 kg)    Physical Exam Vitals reviewed.  Constitutional:      General: She is not  in acute distress.    Appearance: She is well-developed. She is obese. She is not toxic-appearing.  HENT:     Head: Normocephalic and atraumatic.     Right Ear: Tympanic membrane, ear canal and external ear normal.     Left Ear: Tympanic membrane, ear canal and external ear normal.     Nose: Congestion and rhinorrhea present.     Mouth/Throat:     Mouth: Mucous membranes are moist.     Pharynx: Oropharynx is clear.  Eyes:     Conjunctiva/sclera: Conjunctivae normal.  Cardiovascular:     Rate and Rhythm: Normal rate and regular rhythm.  Pulmonary:     Effort: Pulmonary effort is normal.     Breath sounds: Normal breath sounds.  Abdominal:     General: Bowel sounds are normal.     Palpations: Abdomen is soft. There is no mass.     Tenderness: There is no abdominal tenderness.  Musculoskeletal:     Cervical back: Neck supple.     Right lower leg: No  edema.     Left lower leg: No edema.  Lymphadenopathy:     Cervical: No cervical adenopathy.  Skin:    General: Skin is warm and dry.  Neurological:     Mental Status: She is alert and oriented to person, place, and time.  Psychiatric:        Behavior: Behavior normal.           Assessment & Plan:   Encounter Diagnoses  Name Primary?   Type 2 diabetes mellitus with hyperglycemia, unspecified whether long term insulin  use (HCC) Yes   Essential hypertension    Diabetic retinopathy associated with type 2 diabetes mellitus, macular edema presence unspecified, unspecified laterality, unspecified retinopathy severity (HCC)    Allergic rhinitis, unspecified seasonality, unspecified trigger    Encounter for screening for malignant neoplasm of breast, unspecified screening modality    Not proficient in English language      -pt was referred for screening Mammogram -Last eye app 11/21/22- was supposed to f/u 6 months.  She was unaware that she had appointment scheduled for July.  She was given that information -she is encouraged to exercise regularly, 5-7 days/week, to help increase energy levels.  Also keeping regular sleep schedule -rx zyrtec  to help with allergies and facial headache.  She is to contact office if her symptoms persist -labs pending.  She will be called with results -pt to follow up 3 months. She is to contact office sooner prn

## 2023-07-17 ENCOUNTER — Telehealth: Payer: Self-pay

## 2023-07-17 NOTE — Telephone Encounter (Signed)
 Attempted call to [facility] client for follow up with interpreter services. No answer, she is current with [facility] and was seen last on 07/12/23  A1C 07/12/23 7.4 up slightly from previous 7.1  Her next follow up appointment is scheduled for 10/12/23  No answer today, left message requesting return call. Will mail case management and [facility] welcome letter about services offered if needed.   Will continue to monitor and follow up with client for support and education. Will add to letters today some information (inforgraphic) on Diabetic MyPlate in Spanish and Know your numbers in Spanish.   Kris Pester RN [employer]

## 2023-07-18 ENCOUNTER — Ambulatory Visit: Payer: Self-pay | Admitting: Physician Assistant

## 2023-08-02 ENCOUNTER — Other Ambulatory Visit: Payer: Self-pay | Admitting: Physician Assistant

## 2023-08-03 ENCOUNTER — Telehealth: Payer: Self-pay

## 2023-08-03 NOTE — Telephone Encounter (Signed)
 Telephoned patient at mobile number using interpreter#[id number]. Patient at work and will call back. [facility] (scholarship)

## 2023-08-08 ENCOUNTER — Other Ambulatory Visit: Payer: Self-pay | Admitting: Physician Assistant

## 2023-08-08 DIAGNOSIS — Z1231 Encounter for screening mammogram for malignant neoplasm of breast: Secondary | ICD-10-CM

## 2023-08-28 ENCOUNTER — Encounter (HOSPITAL_COMMUNITY): Payer: Self-pay

## 2023-08-28 ENCOUNTER — Emergency Department (HOSPITAL_COMMUNITY)
Admission: EM | Admit: 2023-08-28 | Discharge: 2023-08-28 | Disposition: A | Discharge status: Home / Self Care | Payer: Self-pay | Attending: Emergency Medicine | Admitting: Emergency Medicine

## 2023-08-28 DIAGNOSIS — Z794 Long term (current) use of insulin: Secondary | ICD-10-CM | POA: Insufficient documentation

## 2023-08-28 DIAGNOSIS — Z7984 Long term (current) use of oral hypoglycemic drugs: Secondary | ICD-10-CM | POA: Insufficient documentation

## 2023-08-28 DIAGNOSIS — T2122XA Burn of second degree of abdominal wall, initial encounter: Secondary | ICD-10-CM | POA: Insufficient documentation

## 2023-08-28 DIAGNOSIS — Z79899 Other long term (current) drug therapy: Secondary | ICD-10-CM | POA: Insufficient documentation

## 2023-08-28 DIAGNOSIS — Z23 Encounter for immunization: Secondary | ICD-10-CM | POA: Insufficient documentation

## 2023-08-28 DIAGNOSIS — Y92009 Unspecified place in unspecified non-institutional (private) residence as the place of occurrence of the external cause: Secondary | ICD-10-CM | POA: Insufficient documentation

## 2023-08-28 DIAGNOSIS — E119 Type 2 diabetes mellitus without complications: Secondary | ICD-10-CM | POA: Insufficient documentation

## 2023-08-28 DIAGNOSIS — I10 Essential (primary) hypertension: Secondary | ICD-10-CM | POA: Insufficient documentation

## 2023-08-28 DIAGNOSIS — X12XXXA Contact with other hot fluids, initial encounter: Secondary | ICD-10-CM | POA: Insufficient documentation

## 2023-08-28 DIAGNOSIS — T310 Burns involving less than 10% of body surface: Secondary | ICD-10-CM | POA: Insufficient documentation

## 2023-08-28 LAB — CBG MONITORING, ED: Glucose-Capillary: 250 mg/dL — ABNORMAL HIGH (ref 70–99)

## 2023-08-28 MED ORDER — NAPROXEN 500 MG PO TABS: 500.0000 mg | ORAL_TABLET | Freq: Two times a day (BID) | ORAL | 0 refills | Status: DC

## 2023-08-28 MED ORDER — BACITRACIN ZINC 500 UNIT/GM EX OINT: TOPICAL_OINTMENT | Freq: Two times a day (BID) | CUTANEOUS | Status: DC

## 2023-08-28 MED ORDER — TETANUS-DIPHTH-ACELL PERTUSSIS 5-2.5-18.5 LF-MCG/0.5 IM SUSY
0.5000 mL | PREFILLED_SYRINGE | Freq: Once | INTRAMUSCULAR | Status: AC
  Administered 2023-08-28: 0.5 mL via INTRAMUSCULAR
  Filled 2023-08-28: qty 0.5

## 2023-08-28 MED ORDER — MUPIROCIN 2 % EX OINT: 1.0000 | TOPICAL_OINTMENT | Freq: Two times a day (BID) | CUTANEOUS | 0 refills | Status: DC

## 2023-08-28 NOTE — ED Triage Notes (Signed)
 Pt reports she took 3 Ibuprofen PTA with minimal relief.

## 2023-08-28 NOTE — ED Triage Notes (Signed)
 Spanish Interpreter utilized during [employer].
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
## 2023-08-30 ENCOUNTER — Other Ambulatory Visit: Payer: Self-pay | Admitting: Physician Assistant

## 2023-09-04 ENCOUNTER — Other Ambulatory Visit: Payer: Self-pay | Admitting: Physician Assistant

## 2023-09-15 ENCOUNTER — Telehealth: Payer: Self-pay
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
## 2023-09-19 ENCOUNTER — Telehealth: Payer: Self-pay
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
## 2023-09-25 ENCOUNTER — Other Ambulatory Visit: Payer: Self-pay | Admitting: Physician Assistant

## 2023-09-25 ENCOUNTER — Ambulatory Visit (HOSPITAL_COMMUNITY)
Admission: RE | Admit: 2023-09-25 | Discharge: 2023-09-25 | Disposition: A | Discharge status: Home / Self Care | Payer: Self-pay | Source: Ambulatory Visit | Attending: Physician Assistant | Admitting: Physician Assistant

## 2023-09-25 DIAGNOSIS — Z1231 Encounter for screening mammogram for malignant neoplasm of breast: Secondary | ICD-10-CM | POA: Insufficient documentation

## 2023-09-25 DIAGNOSIS — E785 Hyperlipidemia, unspecified: Secondary | ICD-10-CM

## 2023-09-25 DIAGNOSIS — I10 Essential (primary) hypertension: Secondary | ICD-10-CM

## 2023-09-25 DIAGNOSIS — E1165 Type 2 diabetes mellitus with hyperglycemia: Secondary | ICD-10-CM

## 2023-10-04 ENCOUNTER — Other Ambulatory Visit: Payer: Self-pay | Admitting: Physician Assistant

## 2023-10-12 ENCOUNTER — Ambulatory Visit: Payer: Self-pay | Admitting: Physician Assistant

## 2023-10-25 ENCOUNTER — Other Ambulatory Visit (HOSPITAL_COMMUNITY)
Admission: RE | Admit: 2023-10-25 | Discharge: 2023-10-25 | Disposition: A | Discharge status: Home / Self Care | Payer: Self-pay | Source: Ambulatory Visit | Attending: Physician Assistant | Admitting: Physician Assistant

## 2023-10-25 DIAGNOSIS — I10 Essential (primary) hypertension: Secondary | ICD-10-CM | POA: Insufficient documentation

## 2023-10-25 DIAGNOSIS — E1165 Type 2 diabetes mellitus with hyperglycemia: Secondary | ICD-10-CM | POA: Insufficient documentation

## 2023-10-25 DIAGNOSIS — E785 Hyperlipidemia, unspecified: Secondary | ICD-10-CM | POA: Insufficient documentation

## 2023-10-25 LAB — COMPREHENSIVE METABOLIC PANEL WITH GFR
ALT: 17 U/L (ref 0–44)
AST: 18 U/L (ref 15–41)
Albumin: 3.7 g/dL (ref 3.5–5.0)
Alkaline Phosphatase: 76 U/L (ref 38–126)
Anion gap: 10 (ref 5–15)
BUN: 12 mg/dL (ref 8–23)
CO2: 24 mmol/L (ref 22–32)
Calcium: 9 mg/dL (ref 8.9–10.3)
Chloride: 100 mmol/L (ref 98–111)
Creatinine, Ser: 0.84 mg/dL (ref 0.44–1.00)
GFR, Estimated: >60 mL/min (ref >60)
Glucose, Bld: 166 mg/dL — ABNORMAL HIGH (ref 70–99)
Potassium: 4.7 mmol/L (ref 3.5–5.1)
Sodium: 134 mmol/L — ABNORMAL LOW (ref 135–145)
Total Bilirubin: 0.7 mg/dL (ref 0.0–1.2)
Total Protein: 7.9 g/dL (ref 6.5–8.1)

## 2023-10-25 LAB — HEMOGLOBIN A1C
Hgb A1c MFr Bld: 8.4 % — ABNORMAL HIGH (ref 4.8–5.6)
Mean Plasma Glucose: 194.38 mg/dL

## 2023-10-25 LAB — LIPID PANEL
Cholesterol: 94 mg/dL (ref 0–200)
HDL: 26 mg/dL — ABNORMAL LOW (ref >40)
LDL Cholesterol: 39 mg/dL (ref 0–99)
Total CHOL/HDL Ratio: 3.6 ratio
Triglycerides: 144 mg/dL (ref <150)
VLDL: 29 mg/dL (ref 0–40)

## 2023-10-31 ENCOUNTER — Ambulatory Visit: Payer: Self-pay | Admitting: Physician Assistant

## 2023-10-31 ENCOUNTER — Encounter: Payer: Self-pay | Admitting: Physician Assistant

## 2023-10-31 VITALS — BP 128/71 | HR 64 | Temp 97.5°F

## 2023-10-31 DIAGNOSIS — K219 Gastro-esophageal reflux disease without esophagitis: Secondary | ICD-10-CM

## 2023-10-31 DIAGNOSIS — Z789 Other specified health status: Secondary | ICD-10-CM

## 2023-10-31 DIAGNOSIS — E11319 Type 2 diabetes mellitus with unspecified diabetic retinopathy without macular edema: Secondary | ICD-10-CM

## 2023-10-31 DIAGNOSIS — E1165 Type 2 diabetes mellitus with hyperglycemia: Secondary | ICD-10-CM

## 2023-10-31 DIAGNOSIS — E785 Hyperlipidemia, unspecified: Secondary | ICD-10-CM

## 2023-10-31 DIAGNOSIS — I10 Essential (primary) hypertension: Secondary | ICD-10-CM

## 2023-10-31 MED ORDER — LOSARTAN POTASSIUM 100 MG PO TABS: 100.0000 mg | ORAL_TABLET | Freq: Every day | ORAL | 0 refills | Status: DC

## 2023-10-31 MED ORDER — OMEPRAZOLE 40 MG PO CPDR: 40.0000 mg | DELAYED_RELEASE_CAPSULE | Freq: Two times a day (BID) | ORAL | 0 refills | Status: DC

## 2023-10-31 MED ORDER — ATORVASTATIN CALCIUM 80 MG PO TABS: ORAL_TABLET | ORAL | 0 refills | Status: DC

## 2023-10-31 MED ORDER — CETIRIZINE HCL 10 MG PO TABS: 10.0000 mg | ORAL_TABLET | Freq: Every day | ORAL | 1 refills | Status: AC

## 2023-10-31 MED ORDER — METFORMIN HCL 1000 MG PO TABS: 1000.0000 mg | ORAL_TABLET | Freq: Two times a day (BID) | ORAL | 0 refills | Status: DC

## 2023-10-31 MED ORDER — HUMULIN 70/30 KWIKPEN (70-30) 100 UNIT/ML ~~LOC~~ SUPN: 25.0000 [IU] | PEN_INJECTOR | Freq: Two times a day (BID) | SUBCUTANEOUS | 3 refills | Status: DC

## 2023-10-31 MED ORDER — AMLODIPINE BESYLATE 10 MG PO TABS: 10.0000 mg | ORAL_TABLET | Freq: Every day | ORAL | 0 refills | Status: DC

## 2023-10-31 NOTE — Progress Notes (Signed)
 BP 128/71   Pulse 64   Temp (!) 97.5 F (36.4 C)   LMP 02/15/2011 (Approximate)   SpO2 98%    Subjective:    Patient ID: Amanda Zamora, female    DOB: 31-Jul-1960, 63 y.o.   MRN: [id number]  HPI: Amanda Zamora is a 63 y.o. female presenting on 10/31/2023 for Diabetes, Hypertension, and Hyperlipidemia   HPI  Chief Complaint  Patient presents with   Diabetes   Hypertension   Hyperlipidemia    Pt is currently using Insulin  25u bid She Feels burning in her throat.  Taking omeprazole  20mg  qd.  This has been going on for More than a month. It is somewhat Improved when she lays on her left side She had a Cold for 3 wk.  Now gone She is taking ibu for back pain. She uses it only at night for past two nights.  The burn on her stomach is getting better.  She always feels tired and sleepy.   She last saw eye dr in October.  Note says f/u 6 mo.  She says the office cancelled it but she doesn't know when next appt is  Pt thinks her A1c is up b/c she was sick.        Relevant past medical, surgical, family and social history reviewed and updated as indicated. Interim medical history since our last visit reviewed. Allergies and medications reviewed and updated.   Current Outpatient Medications:    amLODipine  (NORVASC ) 10 MG tablet, TAKE 1 Tablet BY MOUTH ONCE EVERY DAY, Disp: 90 tablet, Rfl: 0   atorvastatin  (LIPITOR) 80 MG tablet, TAKE 1 Tablet BY MOUTH ONCE EVERY NIGHT AT BEDTIME, Disp: 90 tablet, Rfl: 0   cetirizine  (ZYRTEC ) 10 MG tablet, Take 1 tablet (10 mg total) by mouth daily., Disp: 90 tablet, Rfl: 1   gabapentin  (NEURONTIN ) 300 MG capsule, Take 1 capsule by mouth at bedtime, Disp: 30 capsule, Rfl: 0   HUMULIN  70/30 (70-30) 100 UNIT/ML injection, INJECT 20 UNITS UNDER THE SKIN WITH BREAKFAST, AND 20 UNITS WITH SUPPER IF GLUCOSE IS ABOVE 90 AND SHE IS EATING, Disp: 40 mL, Rfl: 0   ibuprofen (ADVIL) 200 MG tablet, Take 200 mg by mouth every 6 (six) hours as needed.,  Disp: , Rfl:    losartan  (COZAAR ) 100 MG tablet, TAKE 1 Tablet BY MOUTH ONCE EVERY DAY, Disp: 90 tablet, Rfl: 0   metFORMIN  (GLUCOPHAGE ) 1000 MG tablet, TAKE 1 Tablet  BY MOUTH TWICE DAILY WITH A MEAL, Disp: 180 tablet, Rfl: 0   mupirocin  ointment (BACTROBAN ) 2 %, Apply 1 Application topically 2 (two) times daily., Disp: 22 g, Rfl: 0   Omega-3 Fatty Acids (FISH OIL ) 1000 MG CAPS, Take 1 capsule (1,000 mg total) by mouth in the morning and at bedtime., Disp: 60 capsule, Rfl: 2   omeprazole  (PRILOSEC) 20 MG capsule, TAKE 1 Capsule BY MOUTH ONCE EVERY DAY, Disp: 90 capsule, Rfl: 1   cyclobenzaprine  (FLEXERIL ) 10 MG tablet, TAKE 1 TABLET BY MOUTH TWICE DAILY AS NEEDED FOR MUSCLE SPASM (Patient not taking: No sig reported), Disp: 20 tablet, Rfl: 0   dexlansoprazole  (DEXILANT ) 60 MG capsule, Take 1 capsule (60 mg total) by mouth daily. (Patient not taking: Reported on 07/12/2023), Disp: , Rfl:    multivitamin-iron-minerals-folic acid (CENTRUM) chewable tablet, Chew 1 tablet by mouth daily. (Patient not taking: Reported on 07/12/2023), Disp: , Rfl:    naproxen  (NAPROSYN ) 500 MG tablet, Take 1 tablet (500 mg total) by mouth 2 (two) times daily  with a meal., Disp: 30 tablet, Rfl: 0   Review of Systems  Per HPI unless specifically indicated above     Objective:    BP 128/71   Pulse 64   Temp (!) 97.5 F (36.4 C)   LMP 02/15/2011 (Approximate)   SpO2 98%   Wt Readings from Last 3 Encounters:  08/28/23 167 lb 15.9 oz (76.2 kg)  07/12/23 168 lb (76.2 kg)  04/12/23 163 lb (73.9 kg)    Physical Exam Vitals reviewed.  Constitutional:      General: She is not in acute distress.    Appearance: She is well-developed. She is not toxic-appearing.  HENT:     Head: Normocephalic and atraumatic.  Cardiovascular:     Rate and Rhythm: Normal rate and regular rhythm.  Pulmonary:     Effort: Pulmonary effort is normal.     Breath sounds: Normal breath sounds.  Abdominal:     General: Bowel sounds are  normal.     Palpations: Abdomen is soft. There is no mass.     Tenderness: There is no abdominal tenderness. There is no guarding or rebound.  Musculoskeletal:     Cervical back: Neck supple.     Right lower leg: No edema.     Left lower leg: No edema.  Lymphadenopathy:     Cervical: No cervical adenopathy.  Skin:    General: Skin is warm and dry.     Comments: Burn on abdomen mostly healed with early scar formation  Neurological:     Mental Status: She is alert and oriented to person, place, and time.  Psychiatric:        Behavior: Behavior normal.     Results for orders placed or performed during the hospital encounter of 10/25/23  Hemoglobin A1c   Collection Time: 10/25/23  9:17 AM  Result Value Ref Range   Hgb A1c MFr Bld 8.4 (H) 4.8 - 5.6 %   Mean Plasma Glucose 194.38 mg/dL  Lipid panel   Collection Time: 10/25/23  9:17 AM  Result Value Ref Range   Cholesterol 94 0 - 200 mg/dL   Triglycerides 855 <849 mg/dL   HDL 26 (L) >59 mg/dL   Total CHOL/HDL Ratio 3.6 RATIO   VLDL 29 0 - 40 mg/dL   LDL Cholesterol 39 0 - 99 mg/dL  Comprehensive metabolic panel with GFR   Collection Time: 10/25/23  9:17 AM  Result Value Ref Range   Sodium 134 (L) 135 - 145 mmol/L   Potassium 4.7 3.5 - 5.1 mmol/L   Chloride 100 98 - 111 mmol/L   CO2 24 22 - 32 mmol/L   Glucose, Bld 166 (H) 70 - 99 mg/dL   BUN 12 8 - 23 mg/dL   Creatinine, Ser 9.15 0.44 - 1.00 mg/dL   Calcium  9.0 8.9 - 10.3 mg/dL   Total Protein 7.9 6.5 - 8.1 g/dL   Albumin 3.7 3.5 - 5.0 g/dL   AST 18 15 - 41 U/L   ALT 17 0 - 44 U/L   Alkaline Phosphatase 76 38 - 126 U/L   Total Bilirubin 0.7 0.0 - 1.2 mg/dL   GFR, Estimated >39 >39 mL/min   Anion gap 10 5 - 15      Assessment & Plan:   Encounter Diagnoses  Name Primary?   Type 2 diabetes mellitus with hyperglycemia, unspecified whether long term insulin  use (HCC) Yes   Gastroesophageal reflux disease, unspecified whether esophagitis present    Essential  hypertension  Hyperlipidemia, unspecified hyperlipidemia type    Diabetic retinopathy associated with type 2 diabetes mellitus, macular edema presence unspecified, unspecified laterality, unspecified retinopathy severity (HCC)    Not proficient in [place] language      -Reviewed labs with pt -DM Foot exam was updated -[facility] was called and appointment scheduled for October 23,2025 at 2:20pm -Increase omeprazole  to 40mg  bid.  Pt reminded to avoid spicy foods and fried, greasy foods -pt to follow up 6 weeks to recheck gerd.  She is to contact office sooner prn

## 2023-11-06 ENCOUNTER — Other Ambulatory Visit: Payer: Self-pay | Admitting: Physician Assistant

## 2023-12-04 ENCOUNTER — Other Ambulatory Visit: Payer: Self-pay | Admitting: Physician Assistant

## 2023-12-12 ENCOUNTER — Ambulatory Visit: Payer: Self-pay | Admitting: Physician Assistant

## 2023-12-26 ENCOUNTER — Encounter: Payer: Self-pay | Admitting: Physician Assistant

## 2023-12-26 ENCOUNTER — Ambulatory Visit: Payer: Self-pay | Admitting: Physician Assistant

## 2023-12-26 VITALS — BP 137/72 | HR 74 | Temp 97.3°F | Ht 60.5 in | Wt 173.0 lb

## 2023-12-26 DIAGNOSIS — E11319 Type 2 diabetes mellitus with unspecified diabetic retinopathy without macular edema: Secondary | ICD-10-CM

## 2023-12-26 DIAGNOSIS — K219 Gastro-esophageal reflux disease without esophagitis: Secondary | ICD-10-CM

## 2023-12-26 DIAGNOSIS — Z789 Other specified health status: Secondary | ICD-10-CM

## 2023-12-26 NOTE — Progress Notes (Signed)
 BP 137/72   Pulse 74   Temp (!) 97.3 F (36.3 C)   Ht 5' 0.5 (1.537 m)   Wt 173 lb (78.5 kg)   LMP 02/15/2011 (Approximate)   SpO2 99%   BMI 33.23 kg/m    Subjective:    Patient ID: Amanda Zamora, female    DOB: [birth year], 63 y.o.   MRN: [id number]  HPI: Amanda Zamora is a 63 y.o. female presenting on 12/26/2023 for Gastroesophageal Reflux   HPI  Pt says she is doing better.  She is no longer having any reflux symptoms.  She didn't make it to her eye appointment last month.  She says her daughter had to go so hospital.  She says she rescheduled the appointment but she doesn't know when it is.   Relevant past medical, surgical, family and social history reviewed and updated as indicated. Interim medical history since our last visit reviewed. Allergies and medications reviewed and updated.   Current Outpatient Medications:    amLODipine  (NORVASC ) 10 MG tablet, Take 1 tablet (10 mg total) by mouth daily. TAKE 1 Tablet BY MOUTH ONCE EVERY DAY, Disp: 90 tablet, Rfl: 0   atorvastatin  (LIPITOR) 80 MG tablet, TAKE 1 Tablet BY MOUTH ONCE EVERY NIGHT AT BEDTIME, Disp: 90 tablet, Rfl: 0   cetirizine  (ZYRTEC ) 10 MG tablet, Take 1 tablet (10 mg total) by mouth daily., Disp: 90 tablet, Rfl: 1   gabapentin  (NEURONTIN ) 300 MG capsule, Take 1 capsule by mouth at bedtime, Disp: 30 capsule, Rfl: 0   ibuprofen (ADVIL) 200 MG tablet, Take 200 mg by mouth every 6 (six) hours as needed., Disp: , Rfl:    insulin  isophane & regular human KwikPen (HUMULIN  70/30 KWIKPEN) (70-30) 100 UNIT/ML KwikPen, Inject 25 Units into the skin 2 (two) times daily before a meal., Disp: 15 mL, Rfl: 3   losartan  (COZAAR ) 100 MG tablet, Take 1 tablet (100 mg total) by mouth daily. TAKE 1 Tablet BY MOUTH ONCE EVERY DAY, Disp: 90 tablet, Rfl: 0   metFORMIN  (GLUCOPHAGE ) 1000 MG tablet, Take 1 tablet (1,000 mg total) by mouth 2 (two) times daily with a meal., Disp: 180 tablet, Rfl: 0   Omega-3 Fatty Acids (FISH OIL )  1000 MG CAPS, Take 1 capsule (1,000 mg total) by mouth in the morning and at bedtime., Disp: 60 capsule, Rfl: 2   omeprazole  (PRILOSEC) 40 MG capsule, Take 1 capsule (40 mg total) by mouth 2 (two) times daily., Disp: 180 capsule, Rfl: 0   cyclobenzaprine  (FLEXERIL ) 10 MG tablet, TAKE 1 TABLET BY MOUTH TWICE DAILY AS NEEDED FOR MUSCLE SPASM (Patient not taking: Reported on 12/26/2023), Disp: 20 tablet, Rfl: 0   multivitamin-iron-minerals-folic acid (CENTRUM) chewable tablet, Chew 1 tablet by mouth daily. (Patient not taking: Reported on 12/26/2023), Disp: , Rfl:    mupirocin  ointment (BACTROBAN ) 2 %, Apply 1 Application topically 2 (two) times daily. (Patient not taking: Reported on 12/26/2023), Disp: 22 g, Rfl: 0   naproxen  (NAPROSYN ) 500 MG tablet, Take 1 tablet (500 mg total) by mouth 2 (two) times daily with a meal. (Patient not taking: Reported on 12/26/2023), Disp: 30 tablet, Rfl: 0   Review of Systems  Per HPI unless specifically indicated above     Objective:    BP 137/72   Pulse 74   Temp (!) 97.3 F (36.3 C)   Ht 5' 0.5 (1.537 m)   Wt 173 lb (78.5 kg)   LMP 02/15/2011 (Approximate)   SpO2 99%   BMI 33.23 kg/m  Wt Readings from Last 3 Encounters:  12/26/23 173 lb (78.5 kg)  08/28/23 167 lb 15.9 oz (76.2 kg)  07/12/23 168 lb (76.2 kg)    Physical Exam Vitals reviewed.  Constitutional:      General: She is not in acute distress.    Appearance: She is well-developed. She is not toxic-appearing.  HENT:     Head: Normocephalic and atraumatic.  Cardiovascular:     Rate and Rhythm: Normal rate and regular rhythm.  Pulmonary:     Effort: Pulmonary effort is normal.     Breath sounds: Normal breath sounds.  Abdominal:     General: Bowel sounds are normal.     Palpations: Abdomen is soft. There is no mass.     Tenderness: There is no abdominal tenderness. There is no guarding or rebound.  Musculoskeletal:     Cervical back: Neck supple.  Lymphadenopathy:     Cervical:  No cervical adenopathy.  Skin:    General: Skin is warm and dry.  Neurological:     Mental Status: She is alert and oriented to person, place, and time.  Psychiatric:        Behavior: Behavior normal.           Assessment & Plan:   Encounter Diagnoses  Name Primary?   Gastroesophageal reflux disease, unspecified whether esophagitis present Yes   Diabetic retinopathy associated with type 2 diabetes mellitus, macular edema presence unspecified, unspecified laterality, unspecified retinopathy severity (HCC)    Not proficient in English language       -pt to continue current meds -she is to continue to watch what she eats -pt is given voucher for free flu shot -will check on eye appointment for her -pt to follow up two months.  She is to RTO sooner prn

## 2024-01-08 ENCOUNTER — Other Ambulatory Visit: Payer: Self-pay | Admitting: Physician Assistant

## 2024-02-06 ENCOUNTER — Other Ambulatory Visit: Payer: Self-pay | Admitting: Physician Assistant

## 2024-02-12 ENCOUNTER — Other Ambulatory Visit: Payer: Self-pay | Admitting: Physician Assistant

## 2024-02-12 DIAGNOSIS — E785 Hyperlipidemia, unspecified: Secondary | ICD-10-CM

## 2024-02-12 DIAGNOSIS — I10 Essential (primary) hypertension: Secondary | ICD-10-CM

## 2024-02-12 DIAGNOSIS — E1165 Type 2 diabetes mellitus with hyperglycemia: Secondary | ICD-10-CM

## 2024-02-27 ENCOUNTER — Other Ambulatory Visit (HOSPITAL_COMMUNITY)
Admission: RE | Admit: 2024-02-27 | Discharge: 2024-02-27 | Disposition: A | Discharge status: Home / Self Care | Payer: Self-pay | Source: Ambulatory Visit | Attending: Physician Assistant | Admitting: Physician Assistant

## 2024-02-27 ENCOUNTER — Ambulatory Visit: Payer: Self-pay | Admitting: Physician Assistant

## 2024-02-27 ENCOUNTER — Other Ambulatory Visit: Payer: Self-pay | Admitting: Physician Assistant

## 2024-02-27 ENCOUNTER — Encounter: Payer: Self-pay | Admitting: Physician Assistant

## 2024-02-27 VITALS — BP 123/65 | HR 73 | Temp 97.3°F | Ht 60.5 in | Wt 176.0 lb

## 2024-02-27 DIAGNOSIS — E11319 Type 2 diabetes mellitus with unspecified diabetic retinopathy without macular edema: Secondary | ICD-10-CM

## 2024-02-27 DIAGNOSIS — Z23 Encounter for immunization: Secondary | ICD-10-CM

## 2024-02-27 DIAGNOSIS — E1165 Type 2 diabetes mellitus with hyperglycemia: Secondary | ICD-10-CM | POA: Insufficient documentation

## 2024-02-27 DIAGNOSIS — I10 Essential (primary) hypertension: Secondary | ICD-10-CM

## 2024-02-27 DIAGNOSIS — Z789 Other specified health status: Secondary | ICD-10-CM

## 2024-02-27 DIAGNOSIS — E785 Hyperlipidemia, unspecified: Secondary | ICD-10-CM | POA: Insufficient documentation

## 2024-02-27 LAB — LIPID PANEL
Cholesterol: 117 mg/dL (ref 0–200)
HDL: 38 mg/dL — ABNORMAL LOW
LDL Cholesterol: 49 mg/dL (ref 0–99)
Total CHOL/HDL Ratio: 3.1 ratio
Triglycerides: 148 mg/dL
VLDL: 30 mg/dL (ref 0–40)

## 2024-02-27 LAB — COMPREHENSIVE METABOLIC PANEL WITH GFR
ALT: 19 U/L (ref 0–44)
AST: 21 U/L (ref 15–41)
Albumin: 4.3 g/dL (ref 3.5–5.0)
Alkaline Phosphatase: 91 U/L (ref 38–126)
Anion gap: 7 (ref 5–15)
BUN: 16 mg/dL (ref 8–23)
CO2: 30 mmol/L (ref 22–32)
Calcium: 9.2 mg/dL (ref 8.9–10.3)
Chloride: 103 mmol/L (ref 98–111)
Creatinine, Ser: 0.85 mg/dL (ref 0.44–1.00)
GFR, Estimated: >60 mL/min
Glucose, Bld: 151 mg/dL — ABNORMAL HIGH (ref 70–99)
Potassium: 4.6 mmol/L (ref 3.5–5.1)
Sodium: 140 mmol/L (ref 135–145)
Total Bilirubin: 0.5 mg/dL (ref 0.0–1.2)
Total Protein: 7.7 g/dL (ref 6.5–8.1)

## 2024-02-27 LAB — HEMOGLOBIN A1C
Hgb A1c MFr Bld: 8.5 % — ABNORMAL HIGH (ref 4.8–5.6)
Mean Plasma Glucose: 197.25 mg/dL

## 2024-02-27 MED ORDER — SITAGLIPTIN PHOSPHATE 100 MG PO TABS: 100.0000 mg | ORAL_TABLET | Freq: Every day | ORAL | 1 refills | Status: AC

## 2024-02-27 NOTE — Patient Instructions (Addendum)
" °  Date Type Department    03/07/2024 1:00 PM EST Office Visit [facility]  [address]  [address]  [place], [place] [address]  [phone]      "

## 2024-02-27 NOTE — Progress Notes (Signed)
 "  BP 123/65   Pulse 73   Temp (!) 97.3 F (36.3 C)   Ht 5' 0.5 (1.537 m)   Wt 176 lb (79.8 kg)   LMP 02/15/2011   SpO2 99%   BMI 33.81 kg/m    Subjective:    Patient ID: Amanda Zamora, female    DOB: 08-18-1960, 63 y.o.   MRN: [id number]  HPI: Amanda Zamora is a 64 y.o. female presenting on 02/27/2024 for Diabetes   HPI  Chief Complaint  Patient presents with   Diabetes    Pt just got labs drawn this morning so results not available.  Pt says she is feeling well and she has no complaints.   Relevant past medical, surgical, family and social history reviewed and updated as indicated. Interim medical history since our last visit reviewed. Allergies and medications reviewed and updated.   Current Outpatient Medications:    amLODipine  (NORVASC ) 10 MG tablet, Take 1 tablet (10 mg total) by mouth daily. TAKE 1 Tablet BY MOUTH ONCE EVERY DAY, Disp: 90 tablet, Rfl: 0   atorvastatin  (LIPITOR) 80 MG tablet, TAKE 1 Tablet BY MOUTH ONCE EVERY NIGHT AT BEDTIME, Disp: 90 tablet, Rfl: 0   cetirizine  (ZYRTEC ) 10 MG tablet, Take 1 tablet (10 mg total) by mouth daily., Disp: 90 tablet, Rfl: 1   gabapentin  (NEURONTIN ) 300 MG capsule, Take 1 capsule by mouth at bedtime, Disp: 30 capsule, Rfl: 0   ibuprofen (ADVIL) 200 MG tablet, Take 200 mg by mouth every 6 (six) hours as needed., Disp: , Rfl:    insulin  isophane & regular human KwikPen (HUMULIN  70/30 KWIKPEN) (70-30) 100 UNIT/ML KwikPen, Inject 25 Units into the skin 2 (two) times daily before a meal., Disp: 15 mL, Rfl: 3   losartan  (COZAAR ) 100 MG tablet, Take 1 tablet (100 mg total) by mouth daily. TAKE 1 Tablet BY MOUTH ONCE EVERY DAY, Disp: 90 tablet, Rfl: 0   metFORMIN  (GLUCOPHAGE ) 1000 MG tablet, Take 1 tablet (1,000 mg total) by mouth 2 (two) times daily with a meal., Disp: 180 tablet, Rfl: 0   multivitamin-iron-minerals-folic acid (CENTRUM) chewable tablet, Chew 1 tablet by mouth daily., Disp: , Rfl:    Omega-3 Fatty Acids  (FISH OIL ) 1000 MG CAPS, Take 1 capsule (1,000 mg total) by mouth in the morning and at bedtime., Disp: 60 capsule, Rfl: 2   omeprazole  (PRILOSEC) 40 MG capsule, Take 1 capsule (40 mg total) by mouth 2 (two) times daily., Disp: 180 capsule, Rfl: 0   Review of Systems  Per HPI unless specifically indicated above     Objective:    BP 123/65   Pulse 73   Temp (!) 97.3 F (36.3 C)   Ht 5' 0.5 (1.537 m)   Wt 176 lb (79.8 kg)   LMP 02/15/2011   SpO2 99%   BMI 33.81 kg/m   Wt Readings from Last 3 Encounters:  02/27/24 176 lb (79.8 kg)  12/26/23 173 lb (78.5 kg)  08/28/23 167 lb 15.9 oz (76.2 kg)    Physical Exam Vitals reviewed.  Constitutional:      General: She is not in acute distress.    Appearance: She is well-developed. She is not toxic-appearing.  HENT:     Head: Normocephalic and atraumatic.  Cardiovascular:     Rate and Rhythm: Normal rate and regular rhythm.  Pulmonary:     Effort: Pulmonary effort is normal.     Breath sounds: Normal breath sounds.  Abdominal:     General: Bowel  sounds are normal.     Palpations: Abdomen is soft. There is no mass.     Tenderness: There is no abdominal tenderness.  Musculoskeletal:     Cervical back: Neck supple.     Right lower leg: No edema.     Left lower leg: No edema.  Lymphadenopathy:     Cervical: No cervical adenopathy.  Skin:    General: Skin is warm and dry.  Neurological:     Mental Status: She is alert and oriented to person, place, and time.  Psychiatric:        Behavior: Behavior normal.     Results for orders placed or performed during the hospital encounter of 02/27/24  Comprehensive metabolic panel with GFR   Collection Time: 02/27/24  8:00 AM  Result Value Ref Range   Sodium 140 135 - 145 mmol/L   Potassium 4.6 3.5 - 5.1 mmol/L   Chloride 103 98 - 111 mmol/L   CO2 30 22 - 32 mmol/L   Glucose, Bld 151 (H) 70 - 99 mg/dL   BUN 16 8 - 23 mg/dL   Creatinine, Ser 9.14 0.44 - 1.00 mg/dL   Calcium  9.2  8.9 - 10.3 mg/dL   Total Protein 7.7 6.5 - 8.1 g/dL   Albumin 4.3 3.5 - 5.0 g/dL   AST 21 15 - 41 U/L   ALT 19 0 - 44 U/L   Alkaline Phosphatase 91 38 - 126 U/L   Total Bilirubin 0.5 0.0 - 1.2 mg/dL   GFR, Estimated >39 >39 mL/min   Anion gap 7 5 - 15      Assessment & Plan:    Encounter Diagnoses  Name Primary?   Type 2 diabetes mellitus with hyperglycemia, unspecified whether long term insulin  use (HCC) Yes   Essential hypertension    Hyperlipidemia, unspecified hyperlipidemia type    Diabetic retinopathy associated with type 2 diabetes mellitus, macular edema presence unspecified, unspecified laterality, unspecified retinopathy severity (HCC)    Need for influenza vaccination    Not proficient in English language       -pt will be called with results of lab tests -no medication changes today. -pt was reminded of eye appointment on 03/07/24 at [facility] -pt was given flu shot in office today by nurse Audrene.  She was given VIS -pt will RTO in three months. She is to contact office sooner    "

## 2024-02-28 LAB — MICROALBUMIN, URINE: Microalb, Ur: 20.1 ug/mL — ABNORMAL HIGH

## 2024-03-06 ENCOUNTER — Encounter: Payer: Self-pay | Admitting: Physician Assistant

## 2024-03-06 NOTE — Progress Notes (Signed)
 Pt called to notify the office that she did not receive Januvia  in her shipment from [facility]. LPN called [facility] to ask about this: pt did not receive Januvia  due to being Medicaid eligible. Pt does not qualify for Januvia  unless she submits a Medicaid denial letter.  PA was notified of this. Pt was advised to increase her insulin  from 25 units to 30 units (per PA's recommendations) and to work on getting the medicaid denial letter in order to qualify for Januvia . Pt is to call this office if her FBS is <80 with this new dose increase. Pt verbalized understanding.

## 2024-03-13 ENCOUNTER — Other Ambulatory Visit: Payer: Self-pay | Admitting: Physician Assistant

## 2024-03-13 MED ORDER — GABAPENTIN 300 MG PO CAPS: 300.0000 mg | ORAL_CAPSULE | Freq: Every day | ORAL | 0 refills | Status: AC

## 2024-05-28 ENCOUNTER — Ambulatory Visit: Payer: Self-pay | Admitting: Physician Assistant
